# Patient Record
Sex: Female | Born: 1935 | Race: White | Hispanic: No | Smoking: Never smoker
Health system: Southern US, Community
[De-identification: ages and names within clinical notes are randomized; demographics above are authoritative.]

## PROBLEM LIST (undated history)

## (undated) DIAGNOSIS — I10 Essential (primary) hypertension: Secondary | ICD-10-CM

## (undated) DIAGNOSIS — E7800 Pure hypercholesterolemia, unspecified: Secondary | ICD-10-CM

## (undated) DIAGNOSIS — K5900 Constipation, unspecified: Secondary | ICD-10-CM

## (undated) DIAGNOSIS — J810 Acute pulmonary edema: Secondary | ICD-10-CM

## (undated) DIAGNOSIS — J189 Pneumonia, unspecified organism: Secondary | ICD-10-CM

## (undated) DIAGNOSIS — K449 Diaphragmatic hernia without obstruction or gangrene: Secondary | ICD-10-CM

## (undated) DIAGNOSIS — I639 Cerebral infarction, unspecified: Secondary | ICD-10-CM

## (undated) HISTORY — DX: Diaphragmatic hernia without obstruction or gangrene: K44.9

## (undated) HISTORY — PX: BREAST LUMPECTOMY: SHX2

## (undated) HISTORY — PX: TONSILLECTOMY: SUR1361

---

## 1998-04-04 DIAGNOSIS — C4492 Squamous cell carcinoma of skin, unspecified: Secondary | ICD-10-CM

## 1998-04-04 HISTORY — DX: Squamous cell carcinoma of skin, unspecified: C44.92

## 2003-08-24 DIAGNOSIS — C4492 Squamous cell carcinoma of skin, unspecified: Secondary | ICD-10-CM

## 2003-08-24 HISTORY — DX: Squamous cell carcinoma of skin, unspecified: C44.92

## 2006-07-05 ENCOUNTER — Inpatient Hospital Stay: Payer: Medicare Other | Admitting: Cardiology

## 2011-01-30 DIAGNOSIS — I10 Essential (primary) hypertension: Secondary | ICD-10-CM | POA: Diagnosis present

## 2011-01-30 DIAGNOSIS — M47817 Spondylosis without myelopathy or radiculopathy, lumbosacral region: Secondary | ICD-10-CM | POA: Diagnosis present

## 2011-01-30 DIAGNOSIS — Z7982 Long term (current) use of aspirin: Secondary | ICD-10-CM

## 2011-01-30 DIAGNOSIS — E785 Hyperlipidemia, unspecified: Secondary | ICD-10-CM | POA: Diagnosis present

## 2011-01-30 DIAGNOSIS — R9431 Abnormal electrocardiogram [ECG] [EKG]: Secondary | ICD-10-CM | POA: Diagnosis present

## 2011-01-30 DIAGNOSIS — R29898 Other symptoms and signs involving the musculoskeletal system: Secondary | ICD-10-CM | POA: Diagnosis present

## 2011-01-30 DIAGNOSIS — I6350 Cerebral infarction due to unspecified occlusion or stenosis of unspecified cerebral artery: Principal | ICD-10-CM | POA: Diagnosis present

## 2011-01-30 DIAGNOSIS — Z79899 Other long term (current) drug therapy: Secondary | ICD-10-CM

## 2011-01-30 DIAGNOSIS — I739 Peripheral vascular disease, unspecified: Secondary | ICD-10-CM | POA: Diagnosis present

## 2011-01-31 ENCOUNTER — Inpatient Hospital Stay (HOSPITAL_COMMUNITY)
Admission: RE | Admit: 2011-01-31 | Discharge: 2011-02-02 | DRG: 066 | Disposition: A | Discharge status: Home / Self Care | Payer: Medicare Other | Source: Ambulatory Visit | Attending: Family Medicine | Admitting: Family Medicine

## 2011-02-01 LAB — HEMOGLOBIN A1C
Hgb A1c MFr Bld: 5.8 % — ABNORMAL HIGH (ref <5.7)
Mean Plasma Glucose: 120 mg/dL — ABNORMAL HIGH (ref <117)

## 2011-02-02 DIAGNOSIS — G459 Transient cerebral ischemic attack, unspecified: Secondary | ICD-10-CM

## 2011-02-11 NOTE — Discharge Summary (Signed)
[name], Lane                ACCOUNT NO.:  [id number]  MEDICAL RECORD NO.:  [id number]           PATIENT TYPE:  I  LOCATION:  [id number]                         FACILITY:  [facility]  PHYSICIAN:  Estill Cotta, MD       DATE OF BIRTH:  07-21-1925  DATE OF ADMISSION:  01/31/2011 DATE OF DISCHARGE:                        DISCHARGE SUMMARY - REFERRING   PRIMARY CARE PHYSICIAN:  Rory Percy, MD in [place].  DISCHARGE DIAGNOSES: 1. Acute cerebrovascular accident. 2. Hypertension. 3. Hyperlipidemia. 4. History of peripheral vascular disease with edema.  DISCHARGE MEDICATIONS: 1. Plavix 75 mg p.o. daily. 2. Amlodipine 10 mg p.o. daily. 3. Fish oil 1000 mg 1 capsule p.o. b.i.d. 4. Lasix 40 mg in a.m. and 20 mg in the evening. 5. Docusate 100 mg p.o. q.a.m. 6. Toprol XL 50 mg p.o. q.a.m. 7. Vitamin B12 of 1000 mcg 1 tablet p.o. q.a.m.  CONSULTATIONS:  Neurology, Dr. Krista Blue.  HISTORY OF PRESENT ILLNESS:  Ms. Brooke Lane is an 77 year old female with history of hypertension, hyperlipidemia, peripheral vascular disease who was sent from the [facility] for right-sided weakness.  The patient had initially gone to the emergency room at [facility] on Tuesday, Jan 27, 2011, three days ago when she had developed right-sided arm and leg numbness and tingling with slurred speech.  Per the patient, the symptoms had lasted 20 minutes.  She was already on aspirin.  She was discharged home and was recommended full-dose aspirin.  The patient had carotid Dopplers done as outpatient a day before the admission and was a negative study.  On day prior, on the same day at 6:30 p.m., the patient developed right lower extremity numbness and tingling and slurred speech again.  The slurriness of speech resolved, however, the patient continues to drag her right leg.  Hence, she presented again to the [facility] where MRI was done.  MRI was positive for acute CVA.  Hence, the patient was sent to  [facility] for further workup.  RADIOLOGICAL DATA:  MRI at [facility] showed acute/subacute lacunar infarct in the posterior limb of internal capsule  and also acute infarction in the right corpus callosum.  Echocardiogram has been done, however, results are pending at the time of my dictation.  Carotid Dopplers were done which showed no evidence of ICA stenosis bilaterally  BRIEF HOSPITALIZATION COURSE:  Ms. Brooke Lane is an 77 year old female who presented from the [facility] with acute CVA. 1. Acute CVA.  The patient was admitted at [facility] per the     patient's request.  She was already on aspirin, hence aspirin was     discontinued and she was started on Plavix.  Neurology consultation     was obtained and recommended continuing Plavix, risk factor     control, physical therapy, and complete workup.  Echocardiogram has     been done, however, the results are pending at the time of the     dictation.  PT, OT evaluation was done and the patient has been     doing well and ambulating with a walker without any difficulty.    The patient will be  discharged home today.  PHYSICAL EXAMINATION:  VITAL SIGNS:  At the time of dictation, temperature 97.8, pulse 69, respirations 18, blood pressure 122/71, O2 saturation 96% on room air. GENERAL:  The patient is alert, awake and oriented x3, not in any distress. HEENT:  Anicteric sclerae.  Pink conjunctivae.  Pupils equal and round to light and accommodation.  EOMI. NECK:  Supple.  No lymphadenopathy, no JVD. CVS:  S1, S2 clear.  Regular rate and rhythm. CHEST:  Clear to auscultation bilaterally. ABDOMEN:  Soft, nontender, nondistended, with bowel sounds. EXTREMITIES:  There was no cyanosis or clubbing noted.   DISCHARGE FOLLOWUP:  With Dr. Rory Percy in 2 weeks; Dr. Antony Contras, Stroke Service, in 1 month.  Discharge time, 35 minutes.     Estill Cotta, MD     RR/MEDQ  D:  02/02/2011  T:  02/02/2011  Job:  [id number]  cc:    Marcial Pacas, MD  Pramod P. Leonie Man, MD Fax: [phone]  Rory Percy, MD Fax: [phone]  Electronically Signed by Mary Secord  on 02/11/2011 10:23:08 AM

## 2011-02-11 NOTE — H&P (Signed)
Brooke Lane, Brooke Lane                ACCOUNT NO.:  [id number]  MEDICAL RECORD NO.:  [id number]           PATIENT TYPE:  I  LOCATION:  [id number]                         FACILITY:  [place]  PHYSICIAN:  Estill Cotta, MD       DATE OF BIRTH:  August 13, 1925  DATE OF ADMISSION:  01/30/2011 DATE OF DISCHARGE:                             HISTORY & PHYSICAL   PRIMARY CARE PHYSICIAN:  Rory Percy, MD in [place].  CHIEF COMPLAINT:  Sent from [facility] for acute stroke.  HISTORY OF PRESENT ILLNESS:  Brooke Lane is an 77 year old female with history of hypertension, hyperlipidemia, peripheral vascular disease with peripheral edema, who was sent from the [facility] for right- sided weakness.  History was obtained from the patient and her family present in the room.  The patient had initially gone to the emergency room at [facility] on Tuesday, Jan 27, 2011, three days ago when she had developed right-sided arm and leg numbness and tingling with slurred speech.  Per the patient, the symptoms had lasted 20 minutes. The patient was already on aspirin, she was discharged home and was recommended full-dose aspirin.  The patient was advised to follow up with her PCP and carotid Dopplers were done outpatient on Thursday yesterday, which per the report from the [facility] records was negative study except some plaques, but no significant stenosis.  At 6:30 p.m. yesterday, the patient developed right lower extremity numbness and tingling and slurred speech again.  The patient's family noted that on ambulating the patient was dragging her right leg.  The patient herself denied any headache or any blurry vision, any confusion or any dizziness or lightheadedness.  The slurriness of speech lasted about 20 minutes. The patient, however, continued to drag her right leg, hence she presented again to the [facility], there MRI of the brain was done.  The MRI showed acute/subacute lacunar infarct  in the posterior limb of the internal capsule on left and also acute infarction in the right corpus callosum.  The patient was sent to the [facility] for further workup.  At this time, the patient denies any headache or any chest pain, palpitations, any dizziness, lightheadedness.  She does not have any slurring of speech or any confusion.  However, on ambulation she still drags her right foot.  PAST MEDICAL HISTORY: 1. Hypertension. 2. Hyperlipidemia. 3. History of chronic low back pain secondary to arthritis. 4. Peripheral vascular disease with edema.  SURGICAL HISTORY:  Tonsillectomy.  SOCIAL HISTORY:  The patient denies any smoking, alcohol, or any drug use.  She currently lives at home alone and ambulates without any assistance.  MEDICATIONS:  Prior to admission: 1. Aspirin 325 mg p.o. daily for the last 2 days per patient. 2. Docusate 100 mg p.o. p.r.n. for constipation. 3. Vitamin B12 weekly. 4. Norvasc at 10 mg p.o. daily. 5. Metoprolol 50 mg p.o. daily. 6. Lasix 40 mg p.o. daily. 7. Meclizine p.r.n.  PHYSICAL EXAMINATION:  VITAL SIGNS: Temperature 98.3, pulse 83, respirations 18, and blood pressure 146/73. GENERAL:  The patient is alert, awake and oriented x3, not in acute distress.  HEENT:  Anicteric sclerae.  Pink conjunctivae.  Pupils reactive to light and accommodation.  EOMI. NECK:  Supple.  No lymphopathy, no JVD. CVS:  S1, S2 clear. CHEST:  Clear to auscultation bilaterally. ABDOMEN:  Soft, nontender, nondistended.  Normal bowel sounds. EXTREMITIES:  A 1+ edema with chronic venous stasis changes noted on the bilateral lower extremities, more on the left compared to right. NEURO:  Alert and oriented x3.  Cranial nerves II through XII intact. Strength 5/5 in upper and lower extremities, however, on ambulation the patient drags her right foot.  LAB DIAGNOSTIC DATA:  Lab diagnostic data from [facility], sodium 139, potassium 3.6, BUN 19,  creatinine 1.1, INR 1.0.  EKG in the [facility] had shown a rate of 89, sinus rhythm, left ventricular hypertrophy, old lateral infarct, ST elevation, inferior injury.  This echo was done on Jan 27, 2011.  We will repeat another echo.  MRI at [facility] done on Jan 30, 2011, showed late acute/subacute lacunar infarct, posterior limb of internal capsule on the left is likely symptomatic, larger and likely more recent acute infarction affects the right division region of the corpus callosum and parietooccipital radiation, forceps major.  IMPRESSION AND PLAN:  Brooke Lane is an 77 year old female with history of hypertension, hyperlipidemia, peripheral vascular disease, presents with acute cerebrovascular accident from [facility]. 1. Acute cerebrovascular accident.  The patient will be admitted to     the Medicine Service.  We will follow the ischemic CVA protocol.     As the patient was on aspirin and still having symptoms of right     lower extremity weakness with MRI positive for acute CVA, I will     start the patient on Plavix.  I discussed in detail with Dr. Krista Blue     from neurology service who recommended starting the patient on     Plavix 75 mg daily and discontinuing aspirin.  The patient will     have a 2-D echocardiogram done, MRA of the head and neck.  She may     or may not need TEE.  However, we will defer to the Neurology for     further recommendations.  We will also obtain fasting lipid panel,     PT, OT evaluation as well.  Per the patient, she has not tolerated     statins in the past due to significant myalgias, hence for now we     will hold off on the statins. 2. Hypertension.  Restart the patient's p.o. antihypertensives. 3. Abnormal EKG.  We will also obtain EKG at baseline here and cardiac     enzymes to rule out any ACS.  A 2-D     echocardiogram will be also obtained for the EF or any regional     wall motion abnormalities. 4. Hyperlipidemia.   We will obtain fasting lipid panel. 5. DVT prophylaxis.  Lovenox for DVT prophylaxis.     Estill Cotta, MD     RR/MEDQ  D:  01/30/2011  T:  01/30/2011  Job:  [id number]  cc:   Rory Percy, MD Fax: [phone]  Marcial Pacas, MD  Electronically Signed by RIPUDEEP RAI  on 02/11/2011 10:22:36 AM

## 2011-02-11 NOTE — Consult Note (Signed)
Brooke Lane, RUSHLOW                ACCOUNT NO.:  [id number]  MEDICAL RECORD NO.:  [id number]           PATIENT TYPE:  I  LOCATION:  [id number]                         FACILITY:  [facility]  PHYSICIAN:  Marcial Pacas, MD          DATE OF BIRTH:  09-Apr-1925  DATE OF CONSULTATION: DATE OF DISCHARGE:                                CONSULTATION   Neurology Consultation  CHIEF COMPLAINT:  Stroke.  CONSULT FROM:  Hospitalist, Dr. Carloyn Manner.  HISTORY OF PRESENT ILLNESS:  Patient is a pleasant 77 year old right- handed Caucasian female with past medical history of hypertension, hyperlipidemia, peripheral vascular disease, was taking aspirin 81 mg every day.  She was transferred from [facility] on Jan 30, 2011 after MRI has demonstrated acute stroke involving left thalamus adjacent posterior limb of internal capsule and also larger size stroke involving right corpus callosum in the right parietal occipital radiation.  Patient presenting with right arm and leg weakness on Tuesday, Jan 27, 2011 after she got up from the seated position.  There was also mild numbness, tingling on the right side and slurred speech.  She was taken by family to [facility], but symptoms recovered within 30 minutes, she was discharged home.  She was able to drive herself outpatient setting to have ultrasound of carotid artery on the following Thursday, which showed no significant stenosis.  On 6:00 p.m., Friday, Jan 30, 2011, her family noticed she has right lower extremity weakness, dragging her right leg while ambulating, presented to the [facility] again leading to MRI findings detailed above.  Overall, patient has slight improvement, still at baseline she is ambulating without cane, functional at home, complained chronic low back pain radiating pain to her left lower extremity.  REVIEW OF SYSTEMS:  Pertinent as above.  PAST MEDICAL HISTORY: 1. Hypertension. 2. Hyperlipidemia. 3. Chronic low  back pain. 4. Peripheral vascular disease.  PAST SURGICAL HISTORY:  Tonsillectomy.  SOCIAL HISTORY:  She lives alone at home and ambulatory without assistance and independent on daily activity.  Denies smoking, drinking, illicit drugs.  CURRENT MEDICATIONS: 1. Norvasc 10 mg every day. 2. Plavix 75 mg every day. 3. Lovenox 40 mg subcutaneous. 4. Lasix 40 mg every day. 5. Metoprolol 50 mg every day. 6. Tylenol p.r.n. 7. Zofran 4 mg p.r.n.  ALLERGIES:  No known drug allergy.  PHYSICAL EXAMINATION:  VITAL SIGNS:  Temperature 97.9, blood pressure 132/69, heart rate of 62, respiration of 18. CARDIAC:  Regular rate and rhythm. NEUROLOGICAL:  She is awake, alert, oriented to history, taking care of conversation.  Cranial nerves II through XII.  Pupil equal, round, reactive to light.  Extraocular movements were full.  Visual fields were full on confrontational test.  Facial sensation strength was normal. Uvula and tongue midline.  Head turning shoulder shrugging normal symmetric.  Motor examination normal tone, bulk and strength on four extremity with exception of slight right knee flexion weakness. SENSORY:  Normal to light touch.  No extinction on double spontaneous stimulation.  Deep tendon reflex present and symmetric.  Plantar responses were flexor.  COORDINATION:  Normal finger-to-nose, heel-to- shin.  GAIT:  Wide-based cautious dragging right leg mildly.  ASSESSMENT AND PLAN:  [age]-year-old female with vascular risk factor hypertension, hyperlipidemia, peripheral vascular disease, presenting with acute stroke, left thalamus and adjacent posterior limb of IC and right corpus callosum  1. She certainly has multiple vascular risk factor, needs tight     control of blood pressure and hyperlipidemia.  Goal blood pressure     less than 130 out of 80, LDL less than 100. 2. Complete stroke evaluation including echocardiogram. 3. Physical therapy, occupational therapy. 4. Stop  aspirin, change to Plavix. 5. Follow up with [facility], Dr. Krista Blue 1 month upon     discharge.     Marcial Pacas, MD     YY/MEDQ  D:  01/31/2011  T:  01/31/2011  Job:  [id number]  Electronically Signed by Marcial Pacas MD on 02/11/2011 03:55:12 PM

## 2011-10-22 DIAGNOSIS — I10 Essential (primary) hypertension: Secondary | ICD-10-CM | POA: Diagnosis not present

## 2011-10-22 DIAGNOSIS — E538 Deficiency of other specified B group vitamins: Secondary | ICD-10-CM | POA: Diagnosis not present

## 2011-10-22 DIAGNOSIS — E7800 Pure hypercholesterolemia, unspecified: Secondary | ICD-10-CM | POA: Diagnosis not present

## 2011-10-22 DIAGNOSIS — I6789 Other cerebrovascular disease: Secondary | ICD-10-CM | POA: Diagnosis not present

## 2011-10-27 DIAGNOSIS — I10 Essential (primary) hypertension: Secondary | ICD-10-CM | POA: Diagnosis not present

## 2011-10-27 DIAGNOSIS — E7800 Pure hypercholesterolemia, unspecified: Secondary | ICD-10-CM | POA: Diagnosis not present

## 2011-10-27 DIAGNOSIS — I6789 Other cerebrovascular disease: Secondary | ICD-10-CM | POA: Diagnosis not present

## 2011-11-23 DIAGNOSIS — D0460 Carcinoma in situ of skin of unspecified upper limb, including shoulder: Secondary | ICD-10-CM | POA: Diagnosis not present

## 2011-11-23 DIAGNOSIS — L820 Inflamed seborrheic keratosis: Secondary | ICD-10-CM | POA: Diagnosis not present

## 2011-11-23 DIAGNOSIS — L570 Actinic keratosis: Secondary | ICD-10-CM | POA: Diagnosis not present

## 2011-11-23 DIAGNOSIS — L821 Other seborrheic keratosis: Secondary | ICD-10-CM | POA: Diagnosis not present

## 2012-04-26 DIAGNOSIS — E7800 Pure hypercholesterolemia, unspecified: Secondary | ICD-10-CM | POA: Diagnosis not present

## 2012-04-26 DIAGNOSIS — I10 Essential (primary) hypertension: Secondary | ICD-10-CM | POA: Diagnosis not present

## 2012-04-26 DIAGNOSIS — I6789 Other cerebrovascular disease: Secondary | ICD-10-CM | POA: Diagnosis not present

## 2012-04-28 DIAGNOSIS — H353190 Nonexudative age-related macular degeneration, unspecified eye, stage unspecified: Secondary | ICD-10-CM | POA: Diagnosis not present

## 2012-05-18 DIAGNOSIS — H40029 Open angle with borderline findings, high risk, unspecified eye: Secondary | ICD-10-CM | POA: Diagnosis not present

## 2012-05-18 DIAGNOSIS — H2510 Age-related nuclear cataract, unspecified eye: Secondary | ICD-10-CM | POA: Diagnosis not present

## 2012-05-18 DIAGNOSIS — H3530 Unspecified macular degeneration: Secondary | ICD-10-CM | POA: Diagnosis not present

## 2012-05-20 DIAGNOSIS — I10 Essential (primary) hypertension: Secondary | ICD-10-CM | POA: Diagnosis not present

## 2012-05-31 DIAGNOSIS — H2510 Age-related nuclear cataract, unspecified eye: Secondary | ICD-10-CM | POA: Diagnosis not present

## 2012-05-31 DIAGNOSIS — H2589 Other age-related cataract: Secondary | ICD-10-CM | POA: Diagnosis not present

## 2012-07-13 DIAGNOSIS — E7800 Pure hypercholesterolemia, unspecified: Secondary | ICD-10-CM | POA: Diagnosis not present

## 2012-07-13 DIAGNOSIS — I10 Essential (primary) hypertension: Secondary | ICD-10-CM | POA: Diagnosis not present

## 2012-07-13 DIAGNOSIS — I6789 Other cerebrovascular disease: Secondary | ICD-10-CM | POA: Diagnosis not present

## 2012-08-30 DIAGNOSIS — Z23 Encounter for immunization: Secondary | ICD-10-CM | POA: Diagnosis not present

## 2012-11-22 DIAGNOSIS — M999 Biomechanical lesion, unspecified: Secondary | ICD-10-CM | POA: Diagnosis not present

## 2012-11-22 DIAGNOSIS — M5430 Sciatica, unspecified side: Secondary | ICD-10-CM | POA: Diagnosis not present

## 2013-01-06 DIAGNOSIS — I10 Essential (primary) hypertension: Secondary | ICD-10-CM | POA: Diagnosis not present

## 2013-01-06 DIAGNOSIS — E538 Deficiency of other specified B group vitamins: Secondary | ICD-10-CM | POA: Diagnosis not present

## 2013-01-06 DIAGNOSIS — I6789 Other cerebrovascular disease: Secondary | ICD-10-CM | POA: Diagnosis not present

## 2013-01-06 DIAGNOSIS — E7800 Pure hypercholesterolemia, unspecified: Secondary | ICD-10-CM | POA: Diagnosis not present

## 2013-01-09 DIAGNOSIS — I10 Essential (primary) hypertension: Secondary | ICD-10-CM | POA: Diagnosis not present

## 2013-01-09 DIAGNOSIS — I6789 Other cerebrovascular disease: Secondary | ICD-10-CM | POA: Diagnosis not present

## 2013-01-09 DIAGNOSIS — E7800 Pure hypercholesterolemia, unspecified: Secondary | ICD-10-CM | POA: Diagnosis not present

## 2013-02-02 DIAGNOSIS — E538 Deficiency of other specified B group vitamins: Secondary | ICD-10-CM | POA: Diagnosis not present

## 2013-03-23 DIAGNOSIS — E538 Deficiency of other specified B group vitamins: Secondary | ICD-10-CM | POA: Diagnosis not present

## 2013-05-08 DIAGNOSIS — E538 Deficiency of other specified B group vitamins: Secondary | ICD-10-CM | POA: Diagnosis not present

## 2013-05-31 DIAGNOSIS — M5430 Sciatica, unspecified side: Secondary | ICD-10-CM | POA: Diagnosis not present

## 2013-05-31 DIAGNOSIS — M999 Biomechanical lesion, unspecified: Secondary | ICD-10-CM | POA: Diagnosis not present

## 2013-06-13 DIAGNOSIS — M5430 Sciatica, unspecified side: Secondary | ICD-10-CM | POA: Diagnosis not present

## 2013-06-13 DIAGNOSIS — M999 Biomechanical lesion, unspecified: Secondary | ICD-10-CM | POA: Diagnosis not present

## 2013-06-13 DIAGNOSIS — E538 Deficiency of other specified B group vitamins: Secondary | ICD-10-CM | POA: Diagnosis not present

## 2013-07-05 DIAGNOSIS — I6789 Other cerebrovascular disease: Secondary | ICD-10-CM | POA: Diagnosis not present

## 2013-07-05 DIAGNOSIS — E7800 Pure hypercholesterolemia, unspecified: Secondary | ICD-10-CM | POA: Diagnosis not present

## 2013-07-05 DIAGNOSIS — I10 Essential (primary) hypertension: Secondary | ICD-10-CM | POA: Diagnosis not present

## 2013-07-05 DIAGNOSIS — E538 Deficiency of other specified B group vitamins: Secondary | ICD-10-CM | POA: Diagnosis not present

## 2013-07-08 DIAGNOSIS — Z23 Encounter for immunization: Secondary | ICD-10-CM | POA: Diagnosis not present

## 2013-07-12 DIAGNOSIS — E538 Deficiency of other specified B group vitamins: Secondary | ICD-10-CM | POA: Diagnosis not present

## 2013-07-12 DIAGNOSIS — M129 Arthropathy, unspecified: Secondary | ICD-10-CM | POA: Diagnosis not present

## 2013-07-12 DIAGNOSIS — E7800 Pure hypercholesterolemia, unspecified: Secondary | ICD-10-CM | POA: Diagnosis not present

## 2013-07-12 DIAGNOSIS — I6789 Other cerebrovascular disease: Secondary | ICD-10-CM | POA: Diagnosis not present

## 2013-07-12 DIAGNOSIS — I10 Essential (primary) hypertension: Secondary | ICD-10-CM | POA: Diagnosis not present

## 2014-01-04 DIAGNOSIS — M129 Arthropathy, unspecified: Secondary | ICD-10-CM | POA: Diagnosis not present

## 2014-01-04 DIAGNOSIS — E538 Deficiency of other specified B group vitamins: Secondary | ICD-10-CM | POA: Diagnosis not present

## 2014-01-04 DIAGNOSIS — I6789 Other cerebrovascular disease: Secondary | ICD-10-CM | POA: Diagnosis not present

## 2014-01-04 DIAGNOSIS — E7800 Pure hypercholesterolemia, unspecified: Secondary | ICD-10-CM | POA: Diagnosis not present

## 2014-01-04 DIAGNOSIS — E109 Type 1 diabetes mellitus without complications: Secondary | ICD-10-CM | POA: Diagnosis not present

## 2014-01-04 DIAGNOSIS — I10 Essential (primary) hypertension: Secondary | ICD-10-CM | POA: Diagnosis not present

## 2014-01-11 DIAGNOSIS — I10 Essential (primary) hypertension: Secondary | ICD-10-CM | POA: Diagnosis not present

## 2014-01-11 DIAGNOSIS — I6789 Other cerebrovascular disease: Secondary | ICD-10-CM | POA: Diagnosis not present

## 2014-01-11 DIAGNOSIS — Z23 Encounter for immunization: Secondary | ICD-10-CM | POA: Diagnosis not present

## 2014-01-11 DIAGNOSIS — M129 Arthropathy, unspecified: Secondary | ICD-10-CM | POA: Diagnosis not present

## 2014-01-11 DIAGNOSIS — E7800 Pure hypercholesterolemia, unspecified: Secondary | ICD-10-CM | POA: Diagnosis not present

## 2014-07-05 DIAGNOSIS — M1990 Unspecified osteoarthritis, unspecified site: Secondary | ICD-10-CM | POA: Diagnosis not present

## 2014-07-05 DIAGNOSIS — I10 Essential (primary) hypertension: Secondary | ICD-10-CM | POA: Diagnosis not present

## 2014-07-05 DIAGNOSIS — E780 Pure hypercholesterolemia: Secondary | ICD-10-CM | POA: Diagnosis not present

## 2014-07-05 DIAGNOSIS — I6789 Other cerebrovascular disease: Secondary | ICD-10-CM | POA: Diagnosis not present

## 2014-07-05 DIAGNOSIS — E1165 Type 2 diabetes mellitus with hyperglycemia: Secondary | ICD-10-CM | POA: Diagnosis not present

## 2014-07-12 DIAGNOSIS — Z23 Encounter for immunization: Secondary | ICD-10-CM | POA: Diagnosis not present

## 2014-07-12 DIAGNOSIS — I10 Essential (primary) hypertension: Secondary | ICD-10-CM | POA: Diagnosis not present

## 2014-07-12 DIAGNOSIS — E780 Pure hypercholesterolemia: Secondary | ICD-10-CM | POA: Diagnosis not present

## 2014-07-12 DIAGNOSIS — I6789 Other cerebrovascular disease: Secondary | ICD-10-CM | POA: Diagnosis not present

## 2014-07-12 DIAGNOSIS — M1990 Unspecified osteoarthritis, unspecified site: Secondary | ICD-10-CM | POA: Diagnosis not present

## 2014-09-10 DIAGNOSIS — H26492 Other secondary cataract, left eye: Secondary | ICD-10-CM | POA: Diagnosis not present

## 2014-12-25 DIAGNOSIS — M1990 Unspecified osteoarthritis, unspecified site: Secondary | ICD-10-CM | POA: Diagnosis not present

## 2014-12-25 DIAGNOSIS — E780 Pure hypercholesterolemia: Secondary | ICD-10-CM | POA: Diagnosis not present

## 2014-12-25 DIAGNOSIS — D519 Vitamin B12 deficiency anemia, unspecified: Secondary | ICD-10-CM | POA: Diagnosis not present

## 2014-12-25 DIAGNOSIS — I10 Essential (primary) hypertension: Secondary | ICD-10-CM | POA: Diagnosis not present

## 2014-12-25 DIAGNOSIS — I6789 Other cerebrovascular disease: Secondary | ICD-10-CM | POA: Diagnosis not present

## 2015-01-14 DIAGNOSIS — E780 Pure hypercholesterolemia: Secondary | ICD-10-CM | POA: Diagnosis not present

## 2015-01-14 DIAGNOSIS — I6789 Other cerebrovascular disease: Secondary | ICD-10-CM | POA: Diagnosis not present

## 2015-01-14 DIAGNOSIS — M1990 Unspecified osteoarthritis, unspecified site: Secondary | ICD-10-CM | POA: Diagnosis not present

## 2015-01-14 DIAGNOSIS — I10 Essential (primary) hypertension: Secondary | ICD-10-CM | POA: Diagnosis not present

## 2015-01-30 DIAGNOSIS — H903 Sensorineural hearing loss, bilateral: Secondary | ICD-10-CM | POA: Diagnosis not present

## 2015-01-30 DIAGNOSIS — J310 Chronic rhinitis: Secondary | ICD-10-CM | POA: Diagnosis not present

## 2015-02-06 ENCOUNTER — Encounter: Payer: Self-pay | Admitting: *Deleted

## 2015-06-12 DIAGNOSIS — L409 Psoriasis, unspecified: Secondary | ICD-10-CM | POA: Diagnosis not present

## 2015-06-12 DIAGNOSIS — D239 Other benign neoplasm of skin, unspecified: Secondary | ICD-10-CM | POA: Diagnosis not present

## 2015-06-12 DIAGNOSIS — D049 Carcinoma in situ of skin, unspecified: Secondary | ICD-10-CM | POA: Diagnosis not present

## 2015-06-18 DIAGNOSIS — M25561 Pain in right knee: Secondary | ICD-10-CM | POA: Diagnosis not present

## 2015-06-18 DIAGNOSIS — M1711 Unilateral primary osteoarthritis, right knee: Secondary | ICD-10-CM | POA: Diagnosis not present

## 2015-07-09 DIAGNOSIS — M1990 Unspecified osteoarthritis, unspecified site: Secondary | ICD-10-CM | POA: Diagnosis not present

## 2015-07-09 DIAGNOSIS — E039 Hypothyroidism, unspecified: Secondary | ICD-10-CM | POA: Diagnosis not present

## 2015-07-09 DIAGNOSIS — I10 Essential (primary) hypertension: Secondary | ICD-10-CM | POA: Diagnosis not present

## 2015-07-09 DIAGNOSIS — D519 Vitamin B12 deficiency anemia, unspecified: Secondary | ICD-10-CM | POA: Diagnosis not present

## 2015-07-09 DIAGNOSIS — R5383 Other fatigue: Secondary | ICD-10-CM | POA: Diagnosis not present

## 2015-07-09 DIAGNOSIS — R739 Hyperglycemia, unspecified: Secondary | ICD-10-CM | POA: Diagnosis not present

## 2015-07-09 DIAGNOSIS — I6789 Other cerebrovascular disease: Secondary | ICD-10-CM | POA: Diagnosis not present

## 2015-07-09 DIAGNOSIS — E559 Vitamin D deficiency, unspecified: Secondary | ICD-10-CM | POA: Diagnosis not present

## 2015-07-09 DIAGNOSIS — E7800 Pure hypercholesterolemia, unspecified: Secondary | ICD-10-CM | POA: Diagnosis not present

## 2015-07-13 DIAGNOSIS — Z23 Encounter for immunization: Secondary | ICD-10-CM | POA: Diagnosis not present

## 2015-07-16 DIAGNOSIS — D519 Vitamin B12 deficiency anemia, unspecified: Secondary | ICD-10-CM | POA: Diagnosis not present

## 2015-07-16 DIAGNOSIS — I6789 Other cerebrovascular disease: Secondary | ICD-10-CM | POA: Diagnosis not present

## 2015-07-16 DIAGNOSIS — E039 Hypothyroidism, unspecified: Secondary | ICD-10-CM | POA: Diagnosis not present

## 2015-07-16 DIAGNOSIS — I10 Essential (primary) hypertension: Secondary | ICD-10-CM | POA: Diagnosis not present

## 2015-09-12 DIAGNOSIS — H2511 Age-related nuclear cataract, right eye: Secondary | ICD-10-CM | POA: Diagnosis not present

## 2015-09-12 DIAGNOSIS — H40031 Anatomical narrow angle, right eye: Secondary | ICD-10-CM | POA: Diagnosis not present

## 2015-09-12 DIAGNOSIS — H353120 Nonexudative age-related macular degeneration, left eye, stage unspecified: Secondary | ICD-10-CM | POA: Diagnosis not present

## 2015-09-12 DIAGNOSIS — H53001 Unspecified amblyopia, right eye: Secondary | ICD-10-CM | POA: Diagnosis not present

## 2015-10-28 DIAGNOSIS — M1711 Unilateral primary osteoarthritis, right knee: Secondary | ICD-10-CM | POA: Diagnosis not present

## 2016-01-08 DIAGNOSIS — R7309 Other abnormal glucose: Secondary | ICD-10-CM | POA: Diagnosis not present

## 2016-01-08 DIAGNOSIS — I10 Essential (primary) hypertension: Secondary | ICD-10-CM | POA: Diagnosis not present

## 2016-01-08 DIAGNOSIS — R5383 Other fatigue: Secondary | ICD-10-CM | POA: Diagnosis not present

## 2016-01-08 DIAGNOSIS — E039 Hypothyroidism, unspecified: Secondary | ICD-10-CM | POA: Diagnosis not present

## 2016-01-08 DIAGNOSIS — E7800 Pure hypercholesterolemia, unspecified: Secondary | ICD-10-CM | POA: Diagnosis not present

## 2016-01-08 DIAGNOSIS — M1990 Unspecified osteoarthritis, unspecified site: Secondary | ICD-10-CM | POA: Diagnosis not present

## 2016-01-08 DIAGNOSIS — D519 Vitamin B12 deficiency anemia, unspecified: Secondary | ICD-10-CM | POA: Diagnosis not present

## 2016-01-08 DIAGNOSIS — I6789 Other cerebrovascular disease: Secondary | ICD-10-CM | POA: Diagnosis not present

## 2016-01-28 DIAGNOSIS — M1990 Unspecified osteoarthritis, unspecified site: Secondary | ICD-10-CM | POA: Diagnosis not present

## 2016-01-28 DIAGNOSIS — I6789 Other cerebrovascular disease: Secondary | ICD-10-CM | POA: Diagnosis not present

## 2016-01-28 DIAGNOSIS — I10 Essential (primary) hypertension: Secondary | ICD-10-CM | POA: Diagnosis not present

## 2016-01-28 DIAGNOSIS — D519 Vitamin B12 deficiency anemia, unspecified: Secondary | ICD-10-CM | POA: Diagnosis not present

## 2016-01-28 DIAGNOSIS — R5383 Other fatigue: Secondary | ICD-10-CM | POA: Diagnosis not present

## 2016-01-28 DIAGNOSIS — E039 Hypothyroidism, unspecified: Secondary | ICD-10-CM | POA: Diagnosis not present

## 2016-01-28 DIAGNOSIS — E7800 Pure hypercholesterolemia, unspecified: Secondary | ICD-10-CM | POA: Diagnosis not present

## 2016-03-12 DIAGNOSIS — H353120 Nonexudative age-related macular degeneration, left eye, stage unspecified: Secondary | ICD-10-CM | POA: Diagnosis not present

## 2016-03-12 DIAGNOSIS — H26492 Other secondary cataract, left eye: Secondary | ICD-10-CM | POA: Diagnosis not present

## 2016-07-11 DIAGNOSIS — Z23 Encounter for immunization: Secondary | ICD-10-CM | POA: Diagnosis not present

## 2016-07-27 DIAGNOSIS — R5383 Other fatigue: Secondary | ICD-10-CM | POA: Diagnosis not present

## 2016-07-27 DIAGNOSIS — D519 Vitamin B12 deficiency anemia, unspecified: Secondary | ICD-10-CM | POA: Diagnosis not present

## 2016-07-27 DIAGNOSIS — I10 Essential (primary) hypertension: Secondary | ICD-10-CM | POA: Diagnosis not present

## 2016-07-27 DIAGNOSIS — M1990 Unspecified osteoarthritis, unspecified site: Secondary | ICD-10-CM | POA: Diagnosis not present

## 2016-07-27 DIAGNOSIS — E7800 Pure hypercholesterolemia, unspecified: Secondary | ICD-10-CM | POA: Diagnosis not present

## 2016-07-27 DIAGNOSIS — I6789 Other cerebrovascular disease: Secondary | ICD-10-CM | POA: Diagnosis not present

## 2016-07-27 DIAGNOSIS — E039 Hypothyroidism, unspecified: Secondary | ICD-10-CM | POA: Diagnosis not present

## 2016-07-28 DIAGNOSIS — I6789 Other cerebrovascular disease: Secondary | ICD-10-CM | POA: Diagnosis not present

## 2016-07-28 DIAGNOSIS — M1990 Unspecified osteoarthritis, unspecified site: Secondary | ICD-10-CM | POA: Diagnosis not present

## 2016-07-28 DIAGNOSIS — Z6841 Body Mass Index (BMI) 40.0 and over, adult: Secondary | ICD-10-CM | POA: Diagnosis not present

## 2016-07-28 DIAGNOSIS — I10 Essential (primary) hypertension: Secondary | ICD-10-CM | POA: Diagnosis not present

## 2016-07-28 DIAGNOSIS — E039 Hypothyroidism, unspecified: Secondary | ICD-10-CM | POA: Diagnosis not present

## 2016-09-10 DIAGNOSIS — I10 Essential (primary) hypertension: Secondary | ICD-10-CM | POA: Diagnosis not present

## 2016-10-29 DIAGNOSIS — I10 Essential (primary) hypertension: Secondary | ICD-10-CM | POA: Diagnosis not present

## 2016-10-29 DIAGNOSIS — R5383 Other fatigue: Secondary | ICD-10-CM | POA: Diagnosis not present

## 2016-10-29 DIAGNOSIS — I6789 Other cerebrovascular disease: Secondary | ICD-10-CM | POA: Diagnosis not present

## 2016-10-29 DIAGNOSIS — E039 Hypothyroidism, unspecified: Secondary | ICD-10-CM | POA: Diagnosis not present

## 2016-12-01 ENCOUNTER — Inpatient Hospital Stay (HOSPITAL_COMMUNITY)
Admission: EM | Admit: 2016-12-01 | Discharge: 2016-12-09 | DRG: 291 | Disposition: A | Discharge status: Home / Self Care | Payer: Medicare Other | Attending: Internal Medicine | Admitting: Internal Medicine

## 2016-12-01 ENCOUNTER — Inpatient Hospital Stay (HOSPITAL_COMMUNITY): Payer: Medicare Other

## 2016-12-01 ENCOUNTER — Encounter (HOSPITAL_COMMUNITY): Payer: Self-pay

## 2016-12-01 ENCOUNTER — Emergency Department (HOSPITAL_COMMUNITY): Payer: Medicare Other

## 2016-12-01 DIAGNOSIS — Z79899 Other long term (current) drug therapy: Secondary | ICD-10-CM | POA: Diagnosis not present

## 2016-12-01 DIAGNOSIS — J440 Chronic obstructive pulmonary disease with acute lower respiratory infection: Secondary | ICD-10-CM | POA: Diagnosis present

## 2016-12-01 DIAGNOSIS — R0602 Shortness of breath: Secondary | ICD-10-CM | POA: Diagnosis not present

## 2016-12-01 DIAGNOSIS — G4730 Sleep apnea, unspecified: Secondary | ICD-10-CM | POA: Diagnosis present

## 2016-12-01 DIAGNOSIS — Z8701 Personal history of pneumonia (recurrent): Secondary | ICD-10-CM | POA: Diagnosis not present

## 2016-12-01 DIAGNOSIS — Z8673 Personal history of transient ischemic attack (TIA), and cerebral infarction without residual deficits: Secondary | ICD-10-CM

## 2016-12-01 DIAGNOSIS — Z7902 Long term (current) use of antithrombotics/antiplatelets: Secondary | ICD-10-CM | POA: Diagnosis not present

## 2016-12-01 DIAGNOSIS — G9341 Metabolic encephalopathy: Secondary | ICD-10-CM | POA: Diagnosis present

## 2016-12-01 DIAGNOSIS — K449 Diaphragmatic hernia without obstruction or gangrene: Secondary | ICD-10-CM | POA: Diagnosis present

## 2016-12-01 DIAGNOSIS — R05 Cough: Secondary | ICD-10-CM | POA: Diagnosis not present

## 2016-12-01 DIAGNOSIS — I110 Hypertensive heart disease with heart failure: Secondary | ICD-10-CM | POA: Diagnosis not present

## 2016-12-01 DIAGNOSIS — H1031 Unspecified acute conjunctivitis, right eye: Secondary | ICD-10-CM | POA: Diagnosis not present

## 2016-12-01 DIAGNOSIS — E662 Morbid (severe) obesity with alveolar hypoventilation: Secondary | ICD-10-CM | POA: Diagnosis present

## 2016-12-01 DIAGNOSIS — E7800 Pure hypercholesterolemia, unspecified: Secondary | ICD-10-CM | POA: Diagnosis present

## 2016-12-01 DIAGNOSIS — I5031 Acute diastolic (congestive) heart failure: Secondary | ICD-10-CM | POA: Diagnosis present

## 2016-12-01 DIAGNOSIS — J9602 Acute respiratory failure with hypercapnia: Secondary | ICD-10-CM | POA: Diagnosis present

## 2016-12-01 DIAGNOSIS — I5033 Acute on chronic diastolic (congestive) heart failure: Secondary | ICD-10-CM | POA: Diagnosis present

## 2016-12-01 DIAGNOSIS — J810 Acute pulmonary edema: Secondary | ICD-10-CM | POA: Diagnosis not present

## 2016-12-01 DIAGNOSIS — Z66 Do not resuscitate: Secondary | ICD-10-CM | POA: Diagnosis present

## 2016-12-01 DIAGNOSIS — Z6839 Body mass index (BMI) 39.0-39.9, adult: Secondary | ICD-10-CM

## 2016-12-01 DIAGNOSIS — K5909 Other constipation: Secondary | ICD-10-CM | POA: Diagnosis present

## 2016-12-01 DIAGNOSIS — E872 Acidosis: Secondary | ICD-10-CM | POA: Diagnosis not present

## 2016-12-01 DIAGNOSIS — R4182 Altered mental status, unspecified: Secondary | ICD-10-CM | POA: Diagnosis present

## 2016-12-01 DIAGNOSIS — I10 Essential (primary) hypertension: Secondary | ICD-10-CM | POA: Diagnosis not present

## 2016-12-01 DIAGNOSIS — J9622 Acute and chronic respiratory failure with hypercapnia: Secondary | ICD-10-CM | POA: Diagnosis not present

## 2016-12-01 DIAGNOSIS — J9621 Acute and chronic respiratory failure with hypoxia: Secondary | ICD-10-CM | POA: Diagnosis not present

## 2016-12-01 DIAGNOSIS — I5030 Unspecified diastolic (congestive) heart failure: Secondary | ICD-10-CM | POA: Diagnosis not present

## 2016-12-01 DIAGNOSIS — J189 Pneumonia, unspecified organism: Secondary | ICD-10-CM | POA: Diagnosis not present

## 2016-12-01 DIAGNOSIS — J9601 Acute respiratory failure with hypoxia: Secondary | ICD-10-CM | POA: Diagnosis not present

## 2016-12-01 HISTORY — DX: Essential (primary) hypertension: I10

## 2016-12-01 HISTORY — DX: Pure hypercholesterolemia, unspecified: E78.00

## 2016-12-01 HISTORY — DX: Cerebral infarction, unspecified: I63.9

## 2016-12-01 LAB — BASIC METABOLIC PANEL
ANION GAP: 10 (ref 5–15)
Anion gap: 7 (ref 5–15)
BUN: 30 mg/dL — ABNORMAL HIGH (ref 6–20)
BUN: 28 mg/dL — ABNORMAL HIGH (ref 6–20)
CALCIUM: 9.1 mg/dL (ref 8.9–10.3)
CHLORIDE: 96 mmol/L — AB (ref 101–111)
CO2: 26 mmol/L (ref 22–32)
CO2: 30 mmol/L (ref 22–32)
CREATININE: 1.4 mg/dL — AB (ref 0.44–1.00)
Calcium: 8.6 mg/dL — ABNORMAL LOW (ref 8.9–10.3)
Chloride: 99 mmol/L — ABNORMAL LOW (ref 101–111)
Creatinine, Ser: 1.32 mg/dL — ABNORMAL HIGH (ref 0.44–1.00)
GFR calc non Af Amer: 32 mL/min — ABNORMAL LOW (ref >60)
GFR, EST AFRICAN AMERICAN: 40 mL/min — AB (ref >60)
GFR, EST AFRICAN AMERICAN: 37 mL/min — AB (ref >60)
GFR, EST NON AFRICAN AMERICAN: 35 mL/min — AB (ref >60)
Glucose, Bld: 166 mg/dL — ABNORMAL HIGH (ref 65–99)
Glucose, Bld: 143 mg/dL — ABNORMAL HIGH (ref 65–99)
POTASSIUM: 4 mmol/L (ref 3.5–5.1)
POTASSIUM: 4.4 mmol/L (ref 3.5–5.1)
SODIUM: 135 mmol/L (ref 135–145)
SODIUM: 133 mmol/L — AB (ref 135–145)

## 2016-12-01 LAB — BLOOD GAS, ARTERIAL
ACID-BASE EXCESS: 1 mmol/L (ref 0.0–2.0)
ACID-BASE EXCESS: 2.8 mmol/L — AB (ref 0.0–2.0)
BICARBONATE: 24.9 mmol/L (ref 20.0–28.0)
Bicarbonate: 23.1 mmol/L (ref 20.0–28.0)
DRAWN BY: 234301
DRAWN BY: 21310
Delivery systems: POSITIVE
Expiratory PAP: 8
FIO2: 40
INSPIRATORY PAP: 20
O2 Content: 2 L/min
O2 SAT: 94 %
O2 Saturation: 88.3 %
PATIENT TEMPERATURE: 37
PCO2 ART: 76.2 mmHg — AB (ref 32.0–48.0)
PH ART: 7.196 — AB (ref 7.350–7.450)
PO2 ART: 70.6 mmHg — AB (ref 83.0–108.0)
pCO2 arterial: 71.1 mmHg (ref 32.0–48.0)
pH, Arterial: 7.243 — ABNORMAL LOW (ref 7.350–7.450)
pO2, Arterial: 55 mmHg — ABNORMAL LOW (ref 83.0–108.0)

## 2016-12-01 LAB — CBC WITH DIFFERENTIAL/PLATELET
Basophils Absolute: 0 K/uL (ref 0.0–0.1)
Basophils Relative: 0 %
Eosinophils Absolute: 0.3 K/uL (ref 0.0–0.7)
Eosinophils Relative: 2 %
HCT: 39 % (ref 36.0–46.0)
Hemoglobin: 12.7 g/dL (ref 12.0–15.0)
Lymphocytes Relative: 6 %
Lymphs Abs: 0.7 K/uL (ref 0.7–4.0)
MCH: 29.5 pg (ref 26.0–34.0)
MCHC: 32.6 g/dL (ref 30.0–36.0)
MCV: 90.7 fL (ref 78.0–100.0)
Monocytes Absolute: 1.3 K/uL — ABNORMAL HIGH (ref 0.1–1.0)
Monocytes Relative: 12 %
Neutro Abs: 8.9 K/uL — ABNORMAL HIGH (ref 1.7–7.7)
Neutrophils Relative %: 80 %
Platelets: 262 K/uL (ref 150–400)
RBC: 4.3 MIL/uL (ref 3.87–5.11)
RDW: 14.4 % (ref 11.5–15.5)
WBC: 11.2 K/uL — ABNORMAL HIGH (ref 4.0–10.5)

## 2016-12-01 LAB — I-STAT CG4 LACTIC ACID, ED: LACTIC ACID, VENOUS: 1.03 mmol/L (ref 0.5–1.9)

## 2016-12-01 LAB — I-STAT TROPONIN, ED: Troponin i, poc: 0 ng/mL (ref 0.00–0.08)

## 2016-12-01 LAB — STREP PNEUMONIAE URINARY ANTIGEN: STREP PNEUMO URINARY ANTIGEN: NEGATIVE

## 2016-12-01 LAB — BRAIN NATRIURETIC PEPTIDE: B NATRIURETIC PEPTIDE 5: 61 pg/mL (ref 0.0–100.0)

## 2016-12-01 LAB — INFLUENZA PANEL BY PCR (TYPE A & B)
Influenza A By PCR: NEGATIVE
Influenza B By PCR: NEGATIVE

## 2016-12-01 MED ORDER — FUROSEMIDE 10 MG/ML IJ SOLN
40.0000 mg | Freq: Once | INTRAMUSCULAR | Status: AC
  Administered 2016-12-01: 40 mg via INTRAVENOUS
  Filled 2016-12-01: qty 4

## 2016-12-01 MED ORDER — DEXTROSE 5 % IV SOLN
500.0000 mg | INTRAVENOUS | Status: DC
  Administered 2016-12-01 – 2016-12-07 (×7): 500 mg via INTRAVENOUS
  Filled 2016-12-01 (×8): qty 500

## 2016-12-01 MED ORDER — SODIUM CHLORIDE 0.9 % IV BOLUS (SEPSIS)
500.0000 mL | Freq: Once | INTRAVENOUS | Status: AC
  Administered 2016-12-01: 500 mL via INTRAVENOUS

## 2016-12-01 MED ORDER — ACETAMINOPHEN 325 MG PO TABS
650.0000 mg | ORAL_TABLET | Freq: Four times a day (QID) | ORAL | Status: DC | PRN
  Administered 2016-12-01 – 2016-12-03 (×4): 650 mg via ORAL
  Filled 2016-12-01 (×4): qty 2

## 2016-12-01 MED ORDER — SODIUM CHLORIDE 0.9 % IV SOLN
INTRAVENOUS | Status: AC
  Administered 2016-12-01: 05:00:00 via INTRAVENOUS

## 2016-12-01 MED ORDER — LOSARTAN POTASSIUM-HCTZ 100-25 MG PO TABS: 1.0000 | ORAL_TABLET | Freq: Every day | ORAL | Status: DC

## 2016-12-01 MED ORDER — IPRATROPIUM-ALBUTEROL 0.5-2.5 (3) MG/3ML IN SOLN
3.0000 mL | Freq: Four times a day (QID) | RESPIRATORY_TRACT | Status: DC
  Administered 2016-12-01 – 2016-12-03 (×7): 3 mL via RESPIRATORY_TRACT
  Filled 2016-12-01 (×7): qty 3

## 2016-12-01 MED ORDER — DEXTROSE 5 % IV SOLN
500.0000 mg | INTRAVENOUS | Status: DC
  Filled 2016-12-01: qty 500

## 2016-12-01 MED ORDER — LOSARTAN POTASSIUM 50 MG PO TABS: 100.0000 mg | ORAL_TABLET | Freq: Every day | ORAL | Status: DC

## 2016-12-01 MED ORDER — GEMFIBROZIL 600 MG PO TABS
600.0000 mg | ORAL_TABLET | Freq: Two times a day (BID) | ORAL | Status: DC
  Administered 2016-12-01 – 2016-12-09 (×15): 600 mg via ORAL
  Filled 2016-12-01 (×17): qty 1

## 2016-12-01 MED ORDER — ALBUTEROL SULFATE (2.5 MG/3ML) 0.083% IN NEBU
2.5000 mg | INHALATION_SOLUTION | Freq: Once | RESPIRATORY_TRACT | Status: AC
  Administered 2016-12-01: 2.5 mg via RESPIRATORY_TRACT
  Filled 2016-12-01: qty 3

## 2016-12-01 MED ORDER — ONDANSETRON HCL 4 MG/2ML IJ SOLN
4.0000 mg | Freq: Once | INTRAMUSCULAR | Status: AC
  Administered 2016-12-01: 4 mg via INTRAVENOUS
  Filled 2016-12-01: qty 2

## 2016-12-01 MED ORDER — DEXTROSE 5 % IV SOLN
1.0000 g | INTRAVENOUS | Status: DC
  Filled 2016-12-01: qty 10

## 2016-12-01 MED ORDER — CLOPIDOGREL BISULFATE 75 MG PO TABS
75.0000 mg | ORAL_TABLET | Freq: Every day | ORAL | Status: DC
  Administered 2016-12-01 – 2016-12-09 (×9): 75 mg via ORAL
  Filled 2016-12-01 (×9): qty 1

## 2016-12-01 MED ORDER — HYDROCHLOROTHIAZIDE 25 MG PO TABS: 25.0000 mg | ORAL_TABLET | Freq: Every day | ORAL | Status: DC

## 2016-12-01 MED ORDER — AMLODIPINE BESYLATE 5 MG PO TABS
5.0000 mg | ORAL_TABLET | Freq: Every day | ORAL | Status: DC
  Administered 2016-12-01 – 2016-12-09 (×9): 5 mg via ORAL
  Filled 2016-12-01 (×9): qty 1

## 2016-12-01 MED ORDER — DEXTROMETHORPHAN POLISTIREX ER 30 MG/5ML PO SUER
15.0000 mg | Freq: Two times a day (BID) | ORAL | Status: AC
  Administered 2016-12-01 – 2016-12-03 (×4): 15 mg via ORAL
  Filled 2016-12-01 (×5): qty 5

## 2016-12-01 MED ORDER — CEFTRIAXONE SODIUM 1 G IJ SOLR
1.0000 g | INTRAMUSCULAR | Status: DC
  Administered 2016-12-01 – 2016-12-08 (×8): 1 g via INTRAVENOUS
  Filled 2016-12-01 (×8): qty 10

## 2016-12-01 MED ORDER — POLYMYXIN B-TRIMETHOPRIM 10000-0.1 UNIT/ML-% OP SOLN
2.0000 [drp] | OPHTHALMIC | Status: DC
  Administered 2016-12-01 – 2016-12-09 (×43): 2 [drp] via OPHTHALMIC
  Filled 2016-12-01 (×2): qty 10

## 2016-12-01 NOTE — ED Provider Notes (Signed)
[facility] DEPT Provider Note   CSN: [id number] Arrival date & time: 12/01/16  0100     History   Chief Complaint Chief Complaint  Patient presents with  . Shortness of Breath    HPI Brooke Lane is a 83 y.o. female.  Patient presents to the emergency department for evaluation of shortness of breath. Reports that symptoms have been present for approximately 4 days. She started with nasal congestion and then developed cough. Cough has progressively worsened and she has been very short of breath tonight. Patient does report a previous history of pneumonia with similar symptoms. She does report that she had a fever the other day, none today. She is not expressing chest pain. She has had nausea but no vomiting. No diarrhea or abdominal pain.      Past Medical History:  Diagnosis Date  . Hiatal hernia   . High cholesterol   . Hypertension   . Stroke [facility])     There are no active problems to display for this patient.   Past Surgical History:  Procedure Laterality Date  . BREAST LUMPECTOMY    . TONSILLECTOMY      OB History    No data available       Home Medications    Prior to Admission medications   Not on File    Family History No family history on file.  Social History Social History  Substance Use Topics  . Smoking status: Never Smoker  . Smokeless tobacco: Never Used  . Alcohol use No     Allergies   Patient has no known allergies.   Review of Systems Review of Systems  Constitutional: Positive for chills and fever.  Respiratory: Positive for cough and shortness of breath.   Gastrointestinal: Positive for nausea.  All other systems reviewed and are negative.    Physical Exam Updated Vital Signs BP (!) 167/56 (BP Location: Left Arm)   Pulse 100   Temp 98.1 F (36.7 C) (Oral)   Resp 20   Ht 5\' 7"  (1.702 m)   Wt 250 lb (113.4 kg)   SpO2 (!) 83% Comment: patient taking breathing tx at this time  BMI 39.16 kg/m   Physical  Exam  Constitutional: She is oriented to person, place, and time. She appears well-developed and well-nourished. No distress.  HENT:  Head: Normocephalic and atraumatic.  Right Ear: Hearing normal.  Left Ear: Hearing normal.  Nose: Nose normal.  Mouth/Throat: Oropharynx is clear and moist and mucous membranes are normal.  Eyes: Conjunctivae and EOM are normal. Pupils are equal, round, and reactive to light.  Neck: Normal range of motion. Neck supple.  Cardiovascular: Regular rhythm, S1 normal and S2 normal.  Tachycardia present.  Exam reveals no gallop and no friction rub.   No murmur heard. Pulmonary/Chest: Effort normal. No respiratory distress. She has rhonchi. She exhibits no tenderness.  Abdominal: Soft. Normal appearance and bowel sounds are normal. There is no hepatosplenomegaly. There is no tenderness. There is no rebound, no guarding, no tenderness at McBurney's point and negative Murphy's sign. No hernia.  Musculoskeletal: Normal range of motion.  Neurological: She is alert and oriented to person, place, and time. She has normal strength. No cranial nerve deficit or sensory deficit. Coordination normal. GCS eye subscore is 4. GCS verbal subscore is 5. GCS motor subscore is 6.  Skin: Skin is warm, dry and intact. No rash noted. No cyanosis.  Psychiatric: She has a normal mood and affect. Her speech is normal  and behavior is normal. Thought content normal.  Nursing note and vitals reviewed.    ED Treatments / Results  Labs (all labs ordered are listed, but only abnormal results are displayed) Labs Reviewed  CBC WITH DIFFERENTIAL/PLATELET - Abnormal; Notable for the following:       Result Value   WBC 11.2 (*)    Neutro Abs 8.9 (*)    Monocytes Absolute 1.3 (*)    All other components within normal limits  CULTURE, BLOOD (ROUTINE X 2)  CULTURE, BLOOD (ROUTINE X 2)  BASIC METABOLIC PANEL  BRAIN NATRIURETIC PEPTIDE  INFLUENZA PANEL BY PCR (TYPE A & B)  I-STAT CG4 LACTIC  ACID, ED    EKG  EKG Interpretation None       Radiology Dg Chest 2 View  Result Date: 12/01/2016 CLINICAL DATA:  83 year old female with productive cough and shortness of breath. EXAM: CHEST  2 VIEW COMPARISON:  None. FINDINGS: There is emphysematous changes of the lungs. Patchy areas of airspace density in the right upper lobe along the minor fissure as well as in the right middle lobe are concerning for pneumonia. Small left upper lobe hazy airspace density is also noted. There is no significant pleural effusion. No pneumothorax. Top-normal cardiac silhouette. No acute osseous pathology. IMPRESSION: Multiple area of airspace density predominantly involving the right lung likely multifocal pneumonia. Clinical correlation and follow-up to resolution recommended. Electronically Signed   By: Anner Crete M.D.   On: 12/01/2016 02:07    Procedures Procedures (including critical care time)  Medications Ordered in ED Medications  cefTRIAXone (ROCEPHIN) 1 g in dextrose 5 % 50 mL IVPB (not administered)  azithromycin (ZITHROMAX) 500 mg in dextrose 5 % 250 mL IVPB (not administered)  sodium chloride 0.9 % bolus 500 mL (not administered)  ondansetron (ZOFRAN) injection 4 mg (4 mg Intravenous Given 12/01/16 0157)  albuterol (PROVENTIL) (2.5 MG/3ML) 0.083% nebulizer solution 2.5 mg (2.5 mg Nebulization Given 12/01/16 0202)     Initial Impression / Assessment and Plan / ED Course  I have reviewed the triage vital signs and the nursing notes.  Pertinent labs & imaging results that were available during my care of the patient were reviewed by me and considered in my medical decision making (see chart for details).     Patient presents to the emergency department for evaluation of cough, fever sensation and shortness of breath. Symptoms ongoing for 4+ days. Chest x-ray shows evidence of multifocal pneumonia. Patient had borderline oxygen saturations at arrival. With exertion here in the ER,  however, her oxygen saturation dropped to 83%. She was initiated on treatment for community acquired pneumonia with Rocephin and Zithromax, supplemental oxygen and will be admitted to the hospital.  Final Clinical Impressions(s) / ED Diagnoses   Final diagnoses:  Community acquired pneumonia, unspecified laterality    New Prescriptions New Prescriptions   No medications on file     Orpah Greek, MD 12/01/16 [phone]

## 2016-12-01 NOTE — Progress Notes (Signed)
Patient transferred to ICU from medical floor. Patient is not very responsive at this time, only responding to painful stimuli. Foley inserted using sterile technique, patient tolerated well. Patient is on Bipap at this time and is tolerating with O2 saturations at 96%. Family at bedside.

## 2016-12-01 NOTE — Progress Notes (Signed)
Pt's 02 saturations on RA were 82-83%. Breathing tx given and placed on 4LNC. Patient's 02 saturations increased to 94.

## 2016-12-01 NOTE — Progress Notes (Addendum)
CRITICAL VALUE ALERT  Critical value received:  pH 7.19, pCO2 76.2, pO2 55, Saturation 88  Date of notification:  12/01/16  Time of notification:  4715  Critical value read back:Yes.    Nurse who received alert:  Sharyn Blitz, RN  MD notified (1st page):  Dr. Wynetta Emery  Time of first page:  1707  MD notified (2nd page):  Time of second page:  Responding MD:  Dr. Wynetta Emery  Time MD responded:  1710

## 2016-12-01 NOTE — Progress Notes (Addendum)
12/01/2016 4:48 PM  Progress Note  I came to assess patient as RN reported patient had acute altered mental status.  Family says that she was saying things like "she is going with Jesus" and other things that seemed out of the ordinary.  When I examined patient she said that she was feeling better and specifically was breathing better.  I ordered some things to check including a stat ABG, CXR, EKG, BMP, CBC.  Will follow up on those.  Also ordered some scheduled neb treatments and neuro checks.  Will follow.    Murvin Natal, MD

## 2016-12-01 NOTE — Progress Notes (Addendum)
12/01/2016 4:54 PM Update  Critical Care Progress Note  Acute mental status changes and acute respiratory distress.  abg reviewed:  pCO2 76, pO2 55, pH 7.196 CXR: pulmonary edema seen.  Reviewed EKG, no acute findings.   Exam findings: diffuse bibasilar crackles. Scattered rhonchi.  Lasix 40 mg IV.  Bipap and transfer to SDU.   Updated family at bedside (daughters).  Foley catheter placed.   Traveled with patient to SDU and stayed until patient stabilized.   Scheduled duonebs ordered.    Will place on bipap and transfer to SDU.  Follow in SDU.   Critical Care Time Spent: 34 mins  Murvin Natal, MD

## 2016-12-01 NOTE — ED Triage Notes (Addendum)
Reports of productive cough since Wednesday with nasal congestion. Also reports of feeling shortness of breath and nausea.

## 2016-12-01 NOTE — Progress Notes (Addendum)
12/01/2016  1:10 AM  12/01/2016 8:21 AM  Brooke Lane was seen and examined.  The H&P by the admitting provider, orders, imaging was reviewed.  Exam reveals conjunctivitis of right eye with thick yellow secretion draining from eye.  Trial of polytrim eye drops ordered.  Reconciled home meds.  Holding losartan / HCT given renal insufficiency.  amlodipine ordered for blood pressure, will monitor.  I spoke with 2 daughters at bedside and updated with plan of care. Please see orders.  Will continue to follow.   Murvin Natal, MD [facility]

## 2016-12-01 NOTE — H&P (Signed)
History and Physical    Brooke Lane VOZ:[id number] DOB: 03-13-1925 DOA: 12/01/2016  PCP: Karsten Ro, DO  Patient coming from: home  Chief Complaint:  Cough, feel awful  HPI: Brooke Lane is a 83 y.o. female with medical history significant of HTN, TIA comes in with 4 days of progressive worsening cough and sob.  She says she has been feeling awful in the last day or so but started feeling under the weather 4 days ago.  Cough is productive.  No fevers or chills.  No swelling in her legs.  No sick contacts.  No nasal congestion.  She called her PCP sat who did start her on a zpack.  Last night she kept coughing and could not sleep so came to the ED.  Mid 80s on RA with activity.  Referred for admission for bilateral pna.  No abx recently except zpack started on sat.  Review of Systems: As per HPI otherwise 10 point review of systems negative.   Past Medical History:  Diagnosis Date  . Hiatal hernia   . High cholesterol   . Hypertension   . Stroke [facility])     Past Surgical History:  Procedure Laterality Date  . BREAST LUMPECTOMY    . TONSILLECTOMY       reports that she has never smoked. She has never used smokeless tobacco. She reports that she does not drink alcohol or use drugs.  No Known Allergies  No family history on file. no premature CAD  Prior to Admission medications   Not on File  zpack  Physical Exam: Vitals:   12/01/16 0107 12/01/16 0108 12/01/16 0202 12/01/16 0322  BP:  (!) 167/56  128/79  Pulse:  100  98  Resp:  20  (!) 22  Temp:  98.1 F (36.7 C)    TempSrc:  Oral    SpO2:  92% (!) 83% 94%  Weight: 113.4 kg (250 lb)     Height: 5\' 7"  (1.702 m)       Constitutional: NAD, calm, comfortable Vitals:   12/01/16 0107 12/01/16 0108 12/01/16 0202 12/01/16 0322  BP:  (!) 167/56  128/79  Pulse:  100  98  Resp:  20  (!) 22  Temp:  98.1 F (36.7 C)    TempSrc:  Oral    SpO2:  92% (!) 83% 94%  Weight: 113.4 kg (250 lb)     Height: 5\' 7"   (1.702 m)      Eyes: PERRL, lids and conjunctivae normal ENMT: Mucous membranes are moist. Posterior pharynx clear of any exudate or lesions.Normal dentition.  Neck: normal, supple, no masses, no thyromegaly Respiratory: clear to auscultation bilaterally, no wheezing, no crackles. Normal respiratory effort. No accessory muscle use.  Cardiovascular: Regular rate and rhythm, no murmurs / rubs / gallops. No extremity edema. 2+ pedal pulses. No carotid bruits.  Abdomen: no tenderness, no masses palpated. No hepatosplenomegaly. Bowel sounds positive.  Musculoskeletal: no clubbing / cyanosis. No joint deformity upper and lower extremities. Good ROM, no contractures. Normal muscle tone.  Skin: no rashes, lesions, ulcers. No induration Neurologic: CN 2-12 grossly intact. Sensation intact, DTR normal. Strength 5/5 in all 4.  Psychiatric: Normal judgment and insight. Alert and oriented x 3. Normal mood.    Labs on Admission: I have personally reviewed following labs and imaging studies  CBC:  Recent Labs Lab 12/01/16 0138  WBC 11.2*  NEUTROABS 8.9*  HGB 12.7  HCT 39.0  MCV 90.7  PLT 262  Basic Metabolic Panel:  Recent Labs Lab 12/01/16 0138  NA 135  K 4.0  CL 99*  CO2 26  GLUCOSE 166*  BUN 30*  CREATININE 1.32*  CALCIUM 9.1   GFR: Estimated Creatinine Clearance: 37.5 mL/min (A) (by C-G formula based on SCr of 1.32 mg/dL (H)).  Radiological Exams on Admission: Dg Chest 2 View  Result Date: 12/01/2016 CLINICAL DATA:  83 year old female with productive cough and shortness of breath. EXAM: CHEST  2 VIEW COMPARISON:  None. FINDINGS: There is emphysematous changes of the lungs. Patchy areas of airspace density in the right upper lobe along the minor fissure as well as in the right middle lobe are concerning for pneumonia. Small left upper lobe hazy airspace density is also noted. There is no significant pleural effusion. No pneumothorax. Top-normal cardiac silhouette. No acute  osseous pathology. IMPRESSION: Multiple area of airspace density predominantly involving the right lung likely multifocal pneumonia. Clinical correlation and follow-up to resolution recommended. Electronically Signed   By: Anner Crete M.D.   On: 12/01/2016 02:07    Assessment/Plan 83 yo female with bilateral pna  Principal Problem:   PNA (pneumonia)- bilateral and community acquired.  Place on pna pathway.  Iv rocephin and azithromycin.  Supplemental oxygen.  Active Problems:   Hypertension- stable   High cholesterol- stable   Acute respiratory failure with hypoxia (HCC)- mild, due to above    DVT prophylaxis:  scds Code Status:  full Family Communication:  none  Disposition Plan:  Per day team Consults called:  none Admission status:   admission   DAVID,RACHAL A MD [facility]  If 7PM-7AM, please contact night-coverage [email or web address] Password [facility]  12/01/2016, 3:34 AM

## 2016-12-01 NOTE — Evaluation (Addendum)
Physical Therapy Evaluation Patient Details Name: Brooke Lane MRN: [id number] DOB: 05-13-1925 Today's Date: 12/01/2016   History of Present Illness  83 y.o. female with medical history significant of HTN, TIA comes in with 4 days of progressive worsening cough and sob.  She says she has been feeling awful in the last day or so but started feeling under the weather 4 days ago.  Cough is productive.  No fevers or chills.  No swelling in her legs.  No sick contacts.  No nasal congestion.  She called her PCP sat who did start her on a zpack.  Last night she kept coughing and could not sleep so came to the ED.  Mid 80s on RA with activity.  Referred for admission for bilateral pna.  No abx recently except zpack started on sat.  Dx: B CAP  Clinical Impression  Pt received in bed, family present, and pt was agreeable to PT evaluation.  Pt's family expressed concern that pt's mental status has changed over the past few hours.  Janett Billow, RN entered the room, and this concern was expressed to her.  Pt is currently on 4L of supplemental O2, and it was turned down to 2L (due to concern for CO2 retention) with pt maintaiing SpO2 at 96% at rest.  Dtr provided history information as pt is currently confused and only oriented x1.  Pt uses a rollator for ambulation, and is modified independent with gait, dressing, and bathing.  She still drives, and accesses the community.  She lives alone, but has a dtr that lives 5 min away and is at the pt's house nearly every day.  During PT evaluation, she requires Min A for transfer supine<>sit.  While sitting on the edge of the bed, her SpO2 was hovering around 90% while on 2L.  She was able to perform sit<>stand with Min A +2 for sit<>stand, and she took a few lateral steps to the head of the bed.  At this point, she is recommended for SNF, however, will continue to assess during each tx.      Follow Up Recommendations SNF    Equipment Recommendations  None recommended by  PT    Recommendations for Other Services       Precautions / Restrictions Precautions Precautions: Fall Precaution Comments: due to recent immobility Restrictions Weight Bearing Restrictions: No      Mobility  Bed Mobility Overal bed mobility: Needs Assistance Bed Mobility: Supine to Sit     Supine to sit: Min assist;HOB elevated     General bed mobility comments: Pt was able to sit on the EOB for 5 min with supervision.    Transfers Overall transfer level: Needs assistance Equipment used: None Transfers: Sit to/from Stand Sit to Stand: Min assist;+2 physical assistance         General transfer comment: Pt was able to stand with Min A +2 and take 2 lateral steps towards the [facility].  Ambulation/Gait                Stairs            Wheelchair Mobility    Modified Rankin (Stroke Patients Only)       Balance Overall balance assessment: Needs assistance Sitting-balance support: Bilateral upper extremity supported;Feet supported Sitting balance-Leahy Scale: Fair Sitting balance - Comments: Pt requires supervision due to lethargy at this time.     Standing balance support: Bilateral upper extremity supported Standing balance-Leahy Scale: Fair  Pertinent Vitals/Pain Pain Assessment: No/denies pain    Home Living   Living Arrangements: Alone Available Help at Discharge: Family (Dtr deborah lives 5 min away and is there nearly every day.  Other dtr is a retired Therapist, sports and is not far away either. ) Type of Home: House Home Access: Stairs to enter   [employer] of Steps: 2 [place]: One level Home Equipment: [place] - 4 wheels;Shower seat (Pt sleeps in a recliner due to back issues. )      Prior Function Level of Independence: Independent with assistive device(s)   Gait / Transfers Assistance Needed: Pt uses rollator for ambulation at all times.  She is modified independent with community  distances  ADL's / Homemaking Assistance Needed: independent with dressing and bathing.  She still drives and goes with her dtr to the grocery store.         Hand Dominance        Extremity/Trunk Assessment   Upper Extremity Assessment Upper Extremity Assessment: Generalized weakness    Lower Extremity Assessment Lower Extremity Assessment: Generalized weakness       Communication   Communication:  (Pt is confused at this time.  Family reports this has been a change over the past few hours. )  Cognition Arousal/Alertness: Lethargic Behavior During Therapy: Flat affect Overall Cognitive Status: Impaired/Different from baseline Area of Impairment: Orientation Orientation Level: Disoriented to;Time;Place;Situation             General Comments: Pt states that she is "somewhere between heaven and earth."     General Comments      Exercises     Assessment/Plan    PT Assessment Patient needs continued PT services  PT Problem List Decreased strength;Decreased activity tolerance;Decreased balance;Decreased mobility;Decreased cognition;Decreased knowledge of use of DME;Decreased safety awareness;Cardiopulmonary status limiting activity;Obesity       PT Treatment Interventions DME instruction;Gait training;Functional mobility training;Therapeutic activities;Therapeutic exercise;Balance training;Patient/family education;Cognitive remediation    PT Goals (Current goals can be found in the Care Plan section)  Acute Rehab PT Goals Patient Stated Goal: To get stronger PT Goal Formulation: With patient/family Time For Goal Achievement: 12/15/16 Potential to Achieve Goals: Good    Frequency Min 3X/week   Barriers to discharge Decreased caregiver support Pt lives alone    Co-evaluation               End of Session Equipment Utilized During Treatment: Oxygen Activity Tolerance: Patient limited by lethargy Patient left: in bed;with call bell/phone within  reach;with bed alarm set;with family/visitor present Nurse Communication: Mobility status Janett Billow, RN notified of pt's mobiltiy status, and mobility sheet left in the room. ) PT Visit Diagnosis: Other abnormalities of gait and mobility (R26.89);Muscle weakness (generalized) (M62.81)    Functional Assessment Tool Used: AM-PAC 6 Clicks Basic Mobility;Clinical judgement Functional Limitation: Mobility: Walking and moving around Mobility: Walking and Moving Around Current Status (G2542): At least 40 percent but less than 60 percent impaired, limited or restricted Mobility: Walking and Moving Around Goal Status [phone]): At least 20 percent but less than 40 percent impaired, limited or restricted    Time: 7628-3151 PT Time Calculation (min) (ACUTE ONLY): 33 min   Charges:   PT Evaluation $PT Eval Low Complexity: 1 Procedure PT Treatments $Therapeutic Activity: 8-22 mins   PT G Codes:   PT G-Codes **NOT FOR INPATIENT CLASS** Functional Assessment Tool Used: AM-PAC 6 Clicks Basic Mobility;Clinical judgement Functional Limitation: Mobility: Walking and moving around Mobility: Walking and Moving Around Current Status (V6160): At  least 40 percent but less than 60 percent impaired, limited or restricted Mobility: Walking and Moving Around Goal Status [phone]): At least 20 percent but less than 40 percent impaired, limited or restricted     Beth Deisha Stull, PT, DPT X: [phone]

## 2016-12-01 NOTE — Progress Notes (Signed)
Pt transferred to [facility] unit per Dr. Wynetta Emery. Report called to Denyse Amass, Therapist, sports. Verbalized understanding. Pt's IV site WDL and patent. Pt lethargic at this time. VSS. Daughters at bedside, updated, verbalized understanding. Pt transported off floor via hospital bed accompanied by RN, NT and RT.

## 2016-12-01 NOTE — Progress Notes (Signed)
ABG drawn and reported via text page to mid-level, Baltazar Najjar.  PH 7.243, CO2 71.1, pO2 70.6, bicarb 24.9, Sat 94

## 2016-12-02 ENCOUNTER — Inpatient Hospital Stay (HOSPITAL_COMMUNITY): Payer: Medicare Other

## 2016-12-02 DIAGNOSIS — I5030 Unspecified diastolic (congestive) heart failure: Secondary | ICD-10-CM

## 2016-12-02 DIAGNOSIS — J9602 Acute respiratory failure with hypercapnia: Secondary | ICD-10-CM

## 2016-12-02 LAB — ECHOCARDIOGRAM COMPLETE
E decel time: 356 msec
E/e' ratio: 9.48
FS: 37 % (ref 28–44)
Height: 67 in
IVS/LV PW RATIO, ED: 0.88
LA vol A4C: 39.8 ml
LADIAMINDEX: 1.44 cm/m2
LASIZE: 34 mm
LAVOL: 45.6 mL
LAVOLIN: 19.3 mL/m2
LEFT ATRIUM END SYS DIAM: 34 mm
LV e' LATERAL: 10.2 cm/s
LVEEAVG: 9.48
LVEEMED: 9.48
LVOT area: 3.14 cm2
LVOT diameter: 20 mm
MV Dec: 356
MV Peak grad: 4 mmHg
MVPKAVEL: 139 m/s
MVPKEVEL: 96.7 m/s
PW: 14.7 mm — AB (ref 0.6–1.1)
TAPSE: 19.8 mm
TDI e' lateral: 10.2
TDI e' medial: 8.7
Weight: 4000 oz

## 2016-12-02 LAB — CBC
HEMATOCRIT: 38.2 % (ref 36.0–46.0)
Hemoglobin: 11.6 g/dL — ABNORMAL LOW (ref 12.0–15.0)
MCH: 28.8 pg (ref 26.0–34.0)
MCHC: 30.4 g/dL (ref 30.0–36.0)
MCV: 94.8 fL (ref 78.0–100.0)
Platelets: 287 10*3/uL (ref 150–400)
RBC: 4.03 MIL/uL (ref 3.87–5.11)
RDW: 15 % (ref 11.5–15.5)
WBC: 8.1 10*3/uL (ref 4.0–10.5)

## 2016-12-02 LAB — BLOOD GAS, ARTERIAL
Acid-Base Excess: 2.3 mmol/L — ABNORMAL HIGH (ref 0.0–2.0)
Acid-Base Excess: 4 mmol/L — ABNORMAL HIGH (ref 0.0–2.0)
BICARBONATE: 26.6 mmol/L (ref 20.0–28.0)
Bicarbonate: 24.4 mmol/L (ref 20.0–28.0)
DELIVERY SYSTEMS: POSITIVE
DRAWN BY: 21310
DRAWN BY: 277331
FIO2: 40
O2 CONTENT: 4 L/min
O2 SAT: 95.1 %
O2 Saturation: 96.7 %
PCO2 ART: 68.6 mmHg — AB (ref 32.0–48.0)
PH ART: 7.248 — AB (ref 7.350–7.450)
Patient temperature: 37
Patient temperature: 37
pCO2 arterial: 64.7 mmHg — ABNORMAL HIGH (ref 32.0–48.0)
pH, Arterial: 7.29 — ABNORMAL LOW (ref 7.350–7.450)
pO2, Arterial: 83.7 mmHg (ref 83.0–108.0)
pO2, Arterial: 71.4 mmHg — ABNORMAL LOW (ref 83.0–108.0)

## 2016-12-02 LAB — BASIC METABOLIC PANEL
ANION GAP: 7 (ref 5–15)
BUN: 31 mg/dL — ABNORMAL HIGH (ref 6–20)
CALCIUM: 8.4 mg/dL — AB (ref 8.9–10.3)
CO2: 29 mmol/L (ref 22–32)
Chloride: 98 mmol/L — ABNORMAL LOW (ref 101–111)
Creatinine, Ser: 1.43 mg/dL — ABNORMAL HIGH (ref 0.44–1.00)
GFR, EST AFRICAN AMERICAN: 36 mL/min — AB (ref >60)
GFR, EST NON AFRICAN AMERICAN: 31 mL/min — AB (ref >60)
Glucose, Bld: 170 mg/dL — ABNORMAL HIGH (ref 65–99)
POTASSIUM: 4.2 mmol/L (ref 3.5–5.1)
Sodium: 134 mmol/L — ABNORMAL LOW (ref 135–145)

## 2016-12-02 LAB — MRSA PCR SCREENING: MRSA by PCR: NEGATIVE

## 2016-12-02 MED ORDER — HEPARIN SODIUM (PORCINE) 5000 UNIT/ML IJ SOLN
5000.0000 [IU] | Freq: Three times a day (TID) | INTRAMUSCULAR | Status: DC
  Administered 2016-12-02 – 2016-12-09 (×21): 5000 [IU] via SUBCUTANEOUS
  Filled 2016-12-02 (×21): qty 1

## 2016-12-02 MED ORDER — FUROSEMIDE 10 MG/ML IJ SOLN
40.0000 mg | Freq: Two times a day (BID) | INTRAMUSCULAR | Status: DC
  Administered 2016-12-02 – 2016-12-05 (×7): 40 mg via INTRAVENOUS
  Filled 2016-12-02 (×7): qty 4

## 2016-12-02 MED ORDER — GUAIFENESIN ER 600 MG PO TB12
1200.0000 mg | ORAL_TABLET | Freq: Two times a day (BID) | ORAL | Status: DC
  Administered 2016-12-02 – 2016-12-09 (×15): 1200 mg via ORAL
  Filled 2016-12-02 (×15): qty 2

## 2016-12-02 MED ORDER — ALBUTEROL SULFATE (2.5 MG/3ML) 0.083% IN NEBU
2.5000 mg | INHALATION_SOLUTION | RESPIRATORY_TRACT | Status: DC | PRN
  Administered 2016-12-05: 2.5 mg via RESPIRATORY_TRACT
  Filled 2016-12-02: qty 3

## 2016-12-02 NOTE — Progress Notes (Signed)
**Note De-Identified Brooke Lane Obfuscation** Patient removed from BIPAP and placed on 6 L [place], tolerating well.  RRT to continue to [facility]

## 2016-12-02 NOTE — Progress Notes (Signed)
*  PRELIMINARY RESULTS* Echocardiogram 2D Echocardiogram has been performed.  Leavy Cella 12/02/2016, 4:03 PM

## 2016-12-02 NOTE — Care Management Note (Signed)
Case Management Note  Patient Details  Name: Brooke Lane MRN: [id number] Date of Birth: 1925-01-17  Subjective/Objective:   Adm with CAP, developed respiratory distress while here, placed on Bipap and tx to ICU. Patient is from home alone, but has good family support. Her daughters plan to stay with her at time of discharge and decline SNF. They decline need for [facility] services as well since one daughter is an Therapist, sports and will be helping with patients care at time of discharge. She has a rollator PTA. No oxygen, currently being weaned.                Action/Plan: Anticipate DC home in care of family when medically ready. CM will follow for any needs.   Expected Discharge Date:       12/04/2016           Expected Discharge Plan:  Home/Self Care  In-House Referral:  Clinical Social Work  Discharge planning Services  CM Consult  Post Acute Care Choice:  NA Choice offered to:  NA  DME Arranged:    DME Agency:     HH Arranged:    HH Agency:     Status of Service:  In process, will continue to follow  If discussed at Long Length of Stay Meetings, dates discussed:    Additional Comments:  [name], [name], RN 12/02/2016, 12:59 PM

## 2016-12-02 NOTE — Progress Notes (Signed)
ABG drawn and reported via text page to MD, Shanon Brow.  PH 7.248, CO2 68.6, pO2 83.7, bicarb 24.4, Sat 96.7

## 2016-12-02 NOTE — Progress Notes (Signed)
Physical Therapy Treatment Patient Details Name: Brooke Lane MRN: [id number] DOB: July 10, 1925 Today's Date: 12/02/2016    History of Present Illness 83 y.o. female with medical history significant of HTN, TIA comes in with 4 days of progressive worsening cough and sob.  She says she has been feeling awful in the last day or so but started feeling under the weather 4 days ago.  Cough is productive.  No fevers or chills.  No swelling in her legs.  No sick contacts.  No nasal congestion.  She called her PCP sat who did start her on a zpack.  Last night she kept coughing and could not sleep so came to the ED.  Mid 80s on RA with activity.  Referred for admission for bilateral pna.  No abx recently except zpack started on sat.  Dx: B CAP.  On 12/01/2016 she became more confused and found to be in respiratory acidosis. She was sent to the stepdown unit and started on BiPAP therapy. Chest x-ray showed pneumonia versus CHF. She is slowly improving. Currently on intravenous antibiotics and IV Lasix..    PT Comments    Pt received in bed, family present, and pt is agreeable to PT tx.  Pt is much more alert today, and able to follow the conversation better.  She requires cues for safety during mobilization.  She was able to ambulate 253ft with Rollator, but needs cues to slow down.  SpO2 at end of ambulation was 86%, but quickly improves >90% on 4L.  Updated d/c recommendations to HHPT with 24/7 supervision/assistance.    Follow Up Recommendations  Home health PT;Supervision/Assistance - 24 hour     Equipment Recommendations  None recommended by PT    Recommendations for Other Services       Precautions / Restrictions Precautions Precautions: Fall Precaution Comments: due to recent immobility, pt is also moderately impulsive when ambulating, and goes at a fast pace for her age. Marland Kitchen  Restrictions Weight Bearing Restrictions: No    Mobility  Bed Mobility Overal bed mobility: Needs  Assistance Bed Mobility: Supine to Sit     Supine to sit: Min guard;HOB elevated        Transfers Overall transfer level: Needs assistance Equipment used: 4-wheeled walker Transfers: Sit to/from Stand Sit to Stand: Min assist;From elevated surface;+2 safety/equipment (The locks on pt's Rollator do not work., wheels blocked by PT and dtr's feet.  )            Ambulation/Gait Ambulation/Gait assistance: Min assist Ambulation Distance (Feet): 200 Feet Assistive device: 4-wheeled walker Gait Pattern/deviations: Trunk flexed;Step-through pattern     General Gait Details: pt goes at a quick pace, and nearly runs over dtr who is pushing the O2 tank.  Pt encouraged to take her time and slow down.  1 short standing rest break to allow obstacle clear from the hallway - pt tripoded on rollator.  At end of gait, pt expressed feeling like her knees were giving out.     Stairs            Wheelchair Mobility    Modified Rankin (Stroke Patients Only)       Balance Overall balance assessment: Needs assistance Sitting-balance support: Bilateral upper extremity supported;Feet supported Sitting balance-Leahy Scale: Good     Standing balance support: Bilateral upper extremity supported Standing balance-Leahy Scale: Fair                      Cognition Arousal/Alertness: Awake/alert Behavior  During Therapy: Impulsive Overall Cognitive Status: Within Functional Limits for tasks assessed                 General Comments: Orientation has improved, and she is able to follow conversation.     Exercises      General Comments        Pertinent Vitals/Pain Pain Assessment: No/denies pain    Home Living                      Prior Function            PT Goals (current goals can now be found in the care plan section) Acute Rehab PT Goals Patient Stated Goal: To get stronger PT Goal Formulation: With patient/family Time For Goal Achievement:  12/15/16 Potential to Achieve Goals: Good Progress towards PT goals: Progressing toward goals    Frequency    Min 3X/week      PT Plan Discharge plan needs to be updated    Co-evaluation             End of Session Equipment Utilized During Treatment: Oxygen;Gait belt Activity Tolerance: Patient limited by fatigue Patient left: in chair;with call bell/phone within reach;with family/visitor present Nurse Communication: Mobility status PT Visit Diagnosis: Other abnormalities of gait and mobility (R26.89);Muscle weakness (generalized) (M62.81)     Time: 3612-2449 PT Time Calculation (min) (ACUTE ONLY): 26 min  Charges:  $Gait Training: 23-37 mins                    G Codes:       Beth Hope Brandenburger, PT, DPT X: [phone]

## 2016-12-02 NOTE — Plan of Care (Signed)
Problem: Fluid Volume: Goal: Ability to maintain a balanced intake and output will improve Outcome: Progressing Patient has catheter to assess accurate fluid output.

## 2016-12-02 NOTE — Clinical Social Work Note (Signed)
LCSW met with patient. Daughter, Jerline Linzy, was at bedside. LCSW discussed patient's recommendation for SNF.  Mrs. [place]house stated that patient will not be going to SNF, she will be going home.    LCSW signing off.     Jamise Pentland, Clydene Pugh, LCSW

## 2016-12-02 NOTE — Progress Notes (Signed)
PROGRESS NOTE    Brooke Lane  MGQ:[id number] DOB: Sep 20, 1924 DOA: 12/01/2016 PCP: Karsten Ro, DO    Brief Narrative:  83 year old female who was brought to the hospital with a four-day history of progressive cough and shortness of breath. Found to be hypoxic on room air. Referred for further treatment of pneumonia. Became acutely lethargic and confused and was noted to be in respiratory acidosis. She was sent to the stepdown unit and started on BiPAP therapy. Chest x-ray showed pneumonia versus CHF. She is slowly improving. Currently on intravenous antibiotics and IV Lasix.   Assessment & Plan:   Principal Problem:   Community acquired pneumonia Active Problems:   Hypertension   High cholesterol   Acute respiratory failure with hypoxia (HCC)   Acute on chronic alteration in mental status   Flash pulmonary edema (HCC)   Chronic constipation   Acute respiratory failure with hypoxia and hypercarbia (HCC)   Acute respiratory failure with hypoxia and hypercarbia. Possibly precipitated by pneumonia versus CHF. She had a markedly elevated PCO2 and low pH yesterday. She was started on BiPAP therapy with improvement of her PCO2. We'll recheck an ABG later today. Continue to wean down oxygen as tolerated. She is a nonsmoker.  Metabolic encephalopathy related to respiratory acidosis. Initially improved after BiPAP therapy. Continue to monitor.  Community-acquired pneumonia. Continues to have productive cough and required supplemental oxygen. Continue intravenous antibiotics and pulmonary hygiene.  Possible congestive heart failure. Chest x-ray indicated possible CHF and she does have some peripheral edema. Was previously on Lasix, but this was changed to hydrochlorothiazide approximately one year ago. We'll continue with IV Lasix for now. Echocardiogram process.  Deconditioning. Seen by physical therapy and recommended replacement.Patient has refused.   DVT prophylaxis:  Heparin Code Status: Full code Family Communication: Discussed with daughters at the bedside Disposition Plan: Discharge home when improved, patient does not wish to go to a nursing home.   Consultants:     Procedures:   Echo pending  Antimicrobials:   Azithromycin 3/20>>  Ceftriaxone 3/20>>   Subjective: Patient was transferred to the stepdown unit yesterday for worsening in mental status, confusion and lethargy. Found to have hypercarbic respiratory failure and started on BiPAP. Overnight her PCO2 did show some mild improvement. This morning she is more awake and able to carry on a conversation. She was taken off BiPAP this morning and had breakfast. She is still somewhat somnolent, but is less confused. She continues to have a productive cough.  Objective: Vitals:   12/02/16 0900 12/02/16 1000 12/02/16 1100 12/02/16 1105  BP: 126/66 (!) 143/55 (!) 144/57   Pulse: 91 90 87 87  Resp: (!) 26 (!) 30 (!) 31 (!) 31  Temp:    98.6 F (37 C)  TempSrc:    Oral  SpO2: 91% 91% 92% 93%  Weight:      Height:        Intake/Output Summary (Last 24 hours) at 12/02/16 1112 Last data filed at 12/02/16 1100  Gross per 24 hour  Intake             1040 ml  Output             1700 ml  Net             -660 ml   Filed Weights   12/01/16 0107  Weight: 113.4 kg (250 lb)    Examination:  General exam: Appears calm and comfortable , Mildly somnolent Respiratory system: Coarse crackles at  bases. Respiratory effort normal. Cardiovascular system: S1 & S2 heard, RRR. No JVD, murmurs, rubs, gallops or clicks. 1+ pedal edema. Gastrointestinal system: Abdomen is nondistended, soft and nontender. No organomegaly or masses felt. Normal bowel sounds heard. Central nervous system: Alert and oriented. No focal neurological deficits. Extremities: Symmetric 5 x 5 power. Skin: No rashes, lesions or ulcers Psychiatry: Judgement and insight appear normal. Mood & affect appropriate.     Data  Reviewed: I have personally reviewed following labs and imaging studies  CBC:  Recent Labs Lab 12/01/16 0138 12/02/16 0436  WBC 11.2* 8.1  NEUTROABS 8.9*  --   HGB 12.7 11.6*  HCT 39.0 38.2  MCV 90.7 94.8  PLT 262 734   Basic Metabolic Panel:  Recent Labs Lab 12/01/16 0138 12/01/16 1701 12/02/16 0436  NA 135 133* 134*  K 4.0 4.4 4.2  CL 99* 96* 98*  CO2 26 30 29   GLUCOSE 166* 143* 170*  BUN 30* 28* 31*  CREATININE 1.32* 1.40* 1.43*  CALCIUM 9.1 8.6* 8.4*   GFR: Estimated Creatinine Clearance: 34.7 mL/min (A) (by C-G formula based on SCr of 1.43 mg/dL (H)). Liver Function Tests: No results for input(s): AST, ALT, ALKPHOS, BILITOT, PROT, ALBUMIN in the last 168 hours. No results for input(s): LIPASE, AMYLASE in the last 168 hours. No results for input(s): AMMONIA in the last 168 hours. Coagulation Profile: No results for input(s): INR, PROTIME in the last 168 hours. Cardiac Enzymes: No results for input(s): CKTOTAL, CKMB, CKMBINDEX, TROPONINI in the last 168 hours. BNP (last 3 results) No results for input(s): PROBNP in the last 8760 hours. HbA1C: No results for input(s): HGBA1C in the last 72 hours. CBG: No results for input(s): GLUCAP in the last 168 hours. Lipid Profile: No results for input(s): CHOL, HDL, LDLCALC, TRIG, CHOLHDL, LDLDIRECT in the last 72 hours. Thyroid Function Tests: No results for input(s): TSH, T4TOTAL, FREET4, T3FREE, THYROIDAB in the last 72 hours. Anemia Panel: No results for input(s): VITAMINB12, FOLATE, FERRITIN, TIBC, IRON, RETICCTPCT in the last 72 hours. Sepsis Labs:  Recent Labs Lab 12/01/16 2876  LATICACIDVEN 1.03    Recent Results (from the past 240 hour(s))  Culture, blood (Routine X 2) w Reflex to ID Panel     Status: None (Preliminary result)   Collection Time: 12/01/16  3:01 AM  Result Value Ref Range Status   Specimen Description BLOOD LEFT HAND  Final   Special Requests BOTTLES DRAWN AEROBIC ONLY 2CC ONLY  Final    Culture NO GROWTH 1 DAY  Final   Report Status PENDING  Incomplete  Culture, blood (Routine X 2) w Reflex to ID Panel     Status: None (Preliminary result)   Collection Time: 12/01/16  3:08 AM  Result Value Ref Range Status   Specimen Description BLOOD RIGHT HAND  Final   Special Requests BOTTLES DRAWN AEROBIC ONLY 2CC ONLY  Final   Culture NO GROWTH 1 DAY  Final   Report Status PENDING  Incomplete  MRSA PCR Screening     Status: None   Collection Time: 12/02/16  6:30 AM  Result Value Ref Range Status   MRSA by PCR NEGATIVE NEGATIVE Final    Comment:        The GeneXpert MRSA Assay (FDA approved for NASAL specimens only), is one component of a comprehensive MRSA colonization surveillance program. It is not intended to diagnose MRSA infection nor to guide or monitor treatment for MRSA infections.  Radiology Studies: Dg Chest 2 View  Result Date: 12/01/2016 CLINICAL DATA:  83 year old female with productive cough and shortness of breath. EXAM: CHEST  2 VIEW COMPARISON:  None. FINDINGS: There is emphysematous changes of the lungs. Patchy areas of airspace density in the right upper lobe along the minor fissure as well as in the right middle lobe are concerning for pneumonia. Small left upper lobe hazy airspace density is also noted. There is no significant pleural effusion. No pneumothorax. Top-normal cardiac silhouette. No acute osseous pathology. IMPRESSION: Multiple area of airspace density predominantly involving the right lung likely multifocal pneumonia. Clinical correlation and follow-up to resolution recommended. Electronically Signed   By: Anner Crete M.D.   On: 12/01/2016 02:07   Dg Chest Port 1 View  Result Date: 12/02/2016 CLINICAL DATA:  Pneumonia EXAM: PORTABLE CHEST 1 VIEW COMPARISON:  December 01, 2016 FINDINGS: There is persistent airspace consolidation in both lower lobes, more on the right than on the left. There is cardiomegaly with pulmonary venous  hypertension. There is slight interstitial edema with small pleural effusions bilaterally. No evident adenopathy. No bone lesions. IMPRESSION: Findings indicative of a degree of congestive heart failure. Suspect superimposed pneumonia in the bases. Some of the opacity in the lung bases may represent alveolar edema; both pneumonia and alveolar edema may present concurrently. The appearance is similar compared to 1 day prior. Electronically Signed   By: Lowella Grip III M.D.   On: 12/02/2016 07:18   Dg Chest Port 1 View  Result Date: 12/01/2016 CLINICAL DATA:  PNEUMONIA, Reports of productive cough since Wednesday with nasal congestion. Also reports of feeling shortness of breath and nausea. HISTORY OF HTN, HIATAL HERNIA, STROKE EXAM: PORTABLE CHEST 1 VIEW COMPARISON:  12/01/2016 FINDINGS: Grossly unchanged cardiac silhouette and mediastinal contours. Pulmonary vasculature appears less distinct than present examination with cephalization of flow. Slightly nodular airspace opacity within the peripheral aspect the right lower lung is grossly unchanged. Interval development of small left-sided effusion with associated worsening left basilar opacities. A small amount of fluid is suspected within the right minor fissure. No pneumothorax. No acute osseus abnormalities. IMPRESSION: 1. Findings worrisome for pulmonary edema with development of a small left-sided effusion and worsening left basilar opacities, atelectasis versus infiltrate. 2. Grossly unchanged ill-defined heterogeneous airspace opacity with the peripheral aspect the right lower lung worrisome for infection. Continued attention on follow-up is recommended. Electronically Signed   By: Sandi Mariscal M.D.   On: 12/01/2016 17:14        Scheduled Meds: . amLODipine  5 mg Oral Daily  . azithromycin  500 mg Intravenous Q24H  . cefTRIAXone (ROCEPHIN)  IV  1 g Intravenous Q24H  . clopidogrel  75 mg Oral Daily  . dextromethorphan  15 mg Oral BID  .  furosemide  40 mg Intravenous BID  . gemfibrozil  600 mg Oral BID AC  . guaiFENesin  1,200 mg Oral BID  . ipratropium-albuterol  3 mL Nebulization Q6H  . trimethoprim-polymyxin b  2 drop Both Eyes Q4H   Continuous Infusions:   LOS: 1 day    Time spent: 62mins    MEMON,JEHANZEB, MD [facility] Pager [phone]  If 7PM-7AM, please contact night-coverage [email or web address] Password [email or web address] 12/02/2016, 11:12 AM

## 2016-12-03 DIAGNOSIS — I5031 Acute diastolic (congestive) heart failure: Secondary | ICD-10-CM | POA: Diagnosis present

## 2016-12-03 DIAGNOSIS — G9341 Metabolic encephalopathy: Secondary | ICD-10-CM | POA: Diagnosis not present

## 2016-12-03 LAB — CBC
HEMATOCRIT: 36.7 % (ref 36.0–46.0)
HEMOGLOBIN: 11.7 g/dL — AB (ref 12.0–15.0)
MCH: 29.8 pg (ref 26.0–34.0)
MCHC: 31.9 g/dL (ref 30.0–36.0)
MCV: 93.6 fL (ref 78.0–100.0)
Platelets: 291 10*3/uL (ref 150–400)
RBC: 3.92 MIL/uL (ref 3.87–5.11)
RDW: 14.6 % (ref 11.5–15.5)
WBC: 8.9 10*3/uL (ref 4.0–10.5)

## 2016-12-03 LAB — BASIC METABOLIC PANEL
Anion gap: 8 (ref 5–15)
BUN: 31 mg/dL — ABNORMAL HIGH (ref 6–20)
CHLORIDE: 96 mmol/L — AB (ref 101–111)
CO2: 31 mmol/L (ref 22–32)
Calcium: 8.6 mg/dL — ABNORMAL LOW (ref 8.9–10.3)
Creatinine, Ser: 1.23 mg/dL — ABNORMAL HIGH (ref 0.44–1.00)
GFR, EST AFRICAN AMERICAN: 44 mL/min — AB (ref >60)
GFR, EST NON AFRICAN AMERICAN: 38 mL/min — AB (ref >60)
Glucose, Bld: 148 mg/dL — ABNORMAL HIGH (ref 65–99)
Potassium: 4.3 mmol/L (ref 3.5–5.1)
Sodium: 135 mmol/L (ref 135–145)

## 2016-12-03 MED ORDER — ONDANSETRON HCL 4 MG/2ML IJ SOLN
4.0000 mg | Freq: Four times a day (QID) | INTRAMUSCULAR | Status: DC | PRN
  Administered 2016-12-03: 4 mg via INTRAVENOUS
  Filled 2016-12-03: qty 2

## 2016-12-03 MED ORDER — IPRATROPIUM-ALBUTEROL 0.5-2.5 (3) MG/3ML IN SOLN
3.0000 mL | Freq: Three times a day (TID) | RESPIRATORY_TRACT | Status: DC
  Administered 2016-12-03 – 2016-12-07 (×12): 3 mL via RESPIRATORY_TRACT
  Filled 2016-12-03 (×12): qty 3

## 2016-12-03 MED ORDER — LORAZEPAM 2 MG/ML IJ SOLN: 0.5000 mg | Freq: Once | INTRAMUSCULAR | Status: DC

## 2016-12-03 NOTE — Progress Notes (Signed)
Upon entering room to do pt breathing treatment noticed pt was off BIPAP machine.. Daughter stated she tried to wear but took it off a couple of times. Pt back on her 4lpm cann. Treatment given and pt tolerating well. Will continue to monitor.

## 2016-12-03 NOTE — Progress Notes (Signed)
PROGRESS NOTE    Brooke Lane  JGG:[id number] DOB: 11-23-1924 DOA: 12/01/2016 PCP: Karsten Ro, DO    Brief Narrative:  83 year old female who was brought to the hospital with a four-day history of progressive cough and shortness of breath. Found to be hypoxic on room air. Referred for further treatment of pneumonia. Became acutely lethargic and confused and was noted to be in respiratory acidosis. She was sent to the stepdown unit and started on BiPAP therapy. Chest x-ray showed pneumonia versus CHF. She is slowly improving. Currently on intravenous antibiotics and IV Lasix.   Assessment & Plan:   Principal Problem:   Community acquired pneumonia Active Problems:   Hypertension   High cholesterol   Acute respiratory failure with hypoxia (HCC)   Acute on chronic alteration in mental status   Flash pulmonary edema (HCC)   Chronic constipation   Acute respiratory failure with hypoxia and hypercarbia (HCC)   Acute respiratory failure with hypoxia and hypercarbia. Possibly precipitated by pneumonia/CHF. She had a markedly elevated PCO2 and low pH on ABG. She was started on BiPAP therapy with improvement of her PCO2. Overall mental status is improving. Will consider holding off on bipap tonight and repeating abg in AM to monitor PCO2 levels. She is still on supplemental oxygen and is being weaned down.  Metabolic encephalopathy related to respiratory acidosis. Initially improved after BiPAP therapy. Continue to monitor.  Community-acquired pneumonia. Continues to have productive cough and required supplemental oxygen. Continue intravenous antibiotics and pulmonary hygiene.  Acute diastolic congestive heart failure. Chest x-ray indicated possible CHF and she does have some peripheral edema. Was previously on Lasix, but this was changed to hydrochlorothiazide approximately one year ago. She is currently on IV lasix with good urine output. We'll continue with IV Lasix for now.  Echocardiogram shows normal EF with diastolic dysfunction.  Deconditioning. Seen by physical therapy and recommended replacement. Patient has refused.   DVT prophylaxis: Heparin Code Status: Full code Family Communication: Discussed with daughters at the bedside Disposition Plan: Discharge home when improved, patient does not wish to go to a nursing home.   Consultants:     Procedures:  Echo- Mild LVH with LVEF 60-65% and grade 1 diastolic dysfunction.   Calcified mitral annulus and aortic annulus. Trivial tricuspid    regurgitation. Unable to assess PASP.  Antimicrobials:   Azithromycin 3/20>>  Ceftriaxone 3/20>>   Subjective: Feels nauseous this morning. Did not eat much breakfast. Had difficulty tolerating bipap overnight. Feels that expiratory pressure is too high to tolerate the mask. Also, mask was hurting the bridge of her nose.  Objective: Vitals:   12/03/16 0400 12/03/16 0500 12/03/16 0718 12/03/16 0754  BP:      Pulse:   81   Resp:   (!) 33   Temp: 98.8 F (37.1 C)  98.7 F (37.1 C)   TempSrc: Oral  Axillary   SpO2:   93% 93%  Weight:  130 kg (286 lb 9.6 oz)    Height:        Intake/Output Summary (Last 24 hours) at 12/03/16 0933 Last data filed at 12/03/16 0500  Gross per 24 hour  Intake              240 ml  Output             3250 ml  Net            -3010 ml   Filed Weights   12/01/16 0107 12/03/16 0500  Weight:  113.4 kg (250 lb) 130 kg (286 lb 9.6 oz)    Examination:  General exam: Appears calm and comfortable , Mildly somnolent Respiratory system: Coarse crackles at bases, improving. Respiratory effort normal. Cardiovascular system: S1 & S2 heard, RRR. No JVD, murmurs, rubs, gallops or clicks. 1+ pedal edema. Gastrointestinal system: Abdomen is nondistended, soft and nontender. No organomegaly or masses felt. Normal bowel sounds heard. Central nervous system: Alert and oriented. No focal neurological deficits. Extremities: Symmetric 5 x 5  power. Skin: No rashes, lesions or ulcers Psychiatry: Judgement and insight appear normal. Mood & affect appropriate.     Data Reviewed: I have personally reviewed following labs and imaging studies  CBC:  Recent Labs Lab 12/01/16 0138 12/02/16 0436 12/03/16 0455  WBC 11.2* 8.1 8.9  NEUTROABS 8.9*  --   --   HGB 12.7 11.6* 11.7*  HCT 39.0 38.2 36.7  MCV 90.7 94.8 93.6  PLT 262 287 740   Basic Metabolic Panel:  Recent Labs Lab 12/01/16 0138 12/01/16 1701 12/02/16 0436 12/03/16 0455  NA 135 133* 134* 135  K 4.0 4.4 4.2 4.3  CL 99* 96* 98* 96*  CO2 26 30 29 31   GLUCOSE 166* 143* 170* 148*  BUN 30* 28* 31* 31*  CREATININE 1.32* 1.40* 1.43* 1.23*  CALCIUM 9.1 8.6* 8.4* 8.6*   GFR: Estimated Creatinine Clearance: 43.6 mL/min (A) (by C-G formula based on SCr of 1.23 mg/dL (H)). Liver Function Tests: No results for input(s): AST, ALT, ALKPHOS, BILITOT, PROT, ALBUMIN in the last 168 hours. No results for input(s): LIPASE, AMYLASE in the last 168 hours. No results for input(s): AMMONIA in the last 168 hours. Coagulation Profile: No results for input(s): INR, PROTIME in the last 168 hours. Cardiac Enzymes: No results for input(s): CKTOTAL, CKMB, CKMBINDEX, TROPONINI in the last 168 hours. BNP (last 3 results) No results for input(s): PROBNP in the last 8760 hours. HbA1C: No results for input(s): HGBA1C in the last 72 hours. CBG: No results for input(s): GLUCAP in the last 168 hours. Lipid Profile: No results for input(s): CHOL, HDL, LDLCALC, TRIG, CHOLHDL, LDLDIRECT in the last 72 hours. Thyroid Function Tests: No results for input(s): TSH, T4TOTAL, FREET4, T3FREE, THYROIDAB in the last 72 hours. Anemia Panel: No results for input(s): VITAMINB12, FOLATE, FERRITIN, TIBC, IRON, RETICCTPCT in the last 72 hours. Sepsis Labs:  Recent Labs Lab 12/01/16 8144  LATICACIDVEN 1.03    Recent Results (from the past 240 hour(s))  Culture, blood (Routine X 2) w Reflex to  ID Panel     Status: None (Preliminary result)   Collection Time: 12/01/16  3:01 AM  Result Value Ref Range Status   Specimen Description BLOOD LEFT HAND  Final   Special Requests BOTTLES DRAWN AEROBIC ONLY 2CC ONLY  Final   Culture NO GROWTH 1 DAY  Final   Report Status PENDING  Incomplete  Culture, blood (Routine X 2) w Reflex to ID Panel     Status: None (Preliminary result)   Collection Time: 12/01/16  3:08 AM  Result Value Ref Range Status   Specimen Description BLOOD RIGHT HAND  Final   Special Requests BOTTLES DRAWN AEROBIC ONLY 2CC ONLY  Final   Culture NO GROWTH 1 DAY  Final   Report Status PENDING  Incomplete  MRSA PCR Screening     Status: None   Collection Time: 12/02/16  6:30 AM  Result Value Ref Range Status   MRSA by PCR NEGATIVE NEGATIVE Final    Comment:  The GeneXpert MRSA Assay (FDA approved for NASAL specimens only), is one component of a comprehensive MRSA colonization surveillance program. It is not intended to diagnose MRSA infection nor to guide or monitor treatment for MRSA infections.          Radiology Studies: Dg Chest Port 1 View  Result Date: 12/02/2016 CLINICAL DATA:  Pneumonia EXAM: PORTABLE CHEST 1 VIEW COMPARISON:  December 01, 2016 FINDINGS: There is persistent airspace consolidation in both lower lobes, more on the right than on the left. There is cardiomegaly with pulmonary venous hypertension. There is slight interstitial edema with small pleural effusions bilaterally. No evident adenopathy. No bone lesions. IMPRESSION: Findings indicative of a degree of congestive heart failure. Suspect superimposed pneumonia in the bases. Some of the opacity in the lung bases may represent alveolar edema; both pneumonia and alveolar edema may present concurrently. The appearance is similar compared to 1 day prior. Electronically Signed   By: Lowella Grip III M.D.   On: 12/02/2016 07:18   Dg Chest Port 1 View  Result Date: 12/01/2016 CLINICAL  DATA:  PNEUMONIA, Reports of productive cough since Wednesday with nasal congestion. Also reports of feeling shortness of breath and nausea. HISTORY OF HTN, HIATAL HERNIA, STROKE EXAM: PORTABLE CHEST 1 VIEW COMPARISON:  12/01/2016 FINDINGS: Grossly unchanged cardiac silhouette and mediastinal contours. Pulmonary vasculature appears less distinct than present examination with cephalization of flow. Slightly nodular airspace opacity within the peripheral aspect the right lower lung is grossly unchanged. Interval development of small left-sided effusion with associated worsening left basilar opacities. A small amount of fluid is suspected within the right minor fissure. No pneumothorax. No acute osseus abnormalities. IMPRESSION: 1. Findings worrisome for pulmonary edema with development of a small left-sided effusion and worsening left basilar opacities, atelectasis versus infiltrate. 2. Grossly unchanged ill-defined heterogeneous airspace opacity with the peripheral aspect the right lower lung worrisome for infection. Continued attention on follow-up is recommended. Electronically Signed   By: Sandi Mariscal M.D.   On: 12/01/2016 17:14        Scheduled Meds: . amLODipine  5 mg Oral Daily  . azithromycin  500 mg Intravenous Q24H  . cefTRIAXone (ROCEPHIN)  IV  1 g Intravenous Q24H  . clopidogrel  75 mg Oral Daily  . dextromethorphan  15 mg Oral BID  . furosemide  40 mg Intravenous BID  . gemfibrozil  600 mg Oral BID AC  . guaiFENesin  1,200 mg Oral BID  . heparin subcutaneous  5,000 Units Subcutaneous Q8H  . ipratropium-albuterol  3 mL Nebulization TID  . trimethoprim-polymyxin b  2 drop Both Eyes Q4H   Continuous Infusions:   LOS: 2 days    Time spent: 76mins    Rohail Klees, MD [facility] Pager [phone]  If 7PM-7AM, please contact night-coverage [email or web address] Password [facility] 12/03/2016, 9:33 AM

## 2016-12-03 NOTE — Progress Notes (Signed)
PT Cancellation Note  Patient Details Name: Brooke Lane MRN: [id number] DOB: [birth year]   Cancelled Treatment:    Reason Eval/Treat Not Completed: Other (comment) (Family requested to hold PT today.  States that she did not have a good night last night.  Will check back on pt tomorrow. )   Beth Latavius Capizzi, PT, DPT X: [phone]

## 2016-12-04 LAB — CBC
HEMATOCRIT: 39.4 % (ref 36.0–46.0)
HEMOGLOBIN: 12.3 g/dL (ref 12.0–15.0)
MCH: 29.4 pg (ref 26.0–34.0)
MCHC: 31.2 g/dL (ref 30.0–36.0)
MCV: 94 fL (ref 78.0–100.0)
Platelets: 291 10*3/uL (ref 150–400)
RBC: 4.19 MIL/uL (ref 3.87–5.11)
RDW: 14.3 % (ref 11.5–15.5)
WBC: 8.4 10*3/uL (ref 4.0–10.5)

## 2016-12-04 LAB — BLOOD GAS, ARTERIAL
Acid-Base Excess: 11.7 mmol/L — ABNORMAL HIGH (ref 0.0–2.0)
BICARBONATE: 33.4 mmol/L — AB (ref 20.0–28.0)
Drawn by: 22223
O2 Content: 4 L/min
O2 Saturation: 92.3 %
PH ART: 7.336 — AB (ref 7.350–7.450)
PO2 ART: 66.2 mmHg — AB (ref 83.0–108.0)
pCO2 arterial: 72.8 mmHg (ref 32.0–48.0)

## 2016-12-04 LAB — BASIC METABOLIC PANEL
Anion gap: 11 (ref 5–15)
BUN: 31 mg/dL — AB (ref 6–20)
CHLORIDE: 89 mmol/L — AB (ref 101–111)
CO2: 35 mmol/L — AB (ref 22–32)
CREATININE: 1.19 mg/dL — AB (ref 0.44–1.00)
Calcium: 8.9 mg/dL (ref 8.9–10.3)
GFR calc Af Amer: 46 mL/min — ABNORMAL LOW (ref >60)
GFR calc non Af Amer: 39 mL/min — ABNORMAL LOW (ref >60)
GLUCOSE: 177 mg/dL — AB (ref 65–99)
Potassium: 4.3 mmol/L (ref 3.5–5.1)
Sodium: 135 mmol/L (ref 135–145)

## 2016-12-04 MED ORDER — ACETAZOLAMIDE SODIUM 500 MG IJ SOLR
500.0000 mg | Freq: Once | INTRAMUSCULAR | Status: AC
  Administered 2016-12-04: 500 mg via INTRAVENOUS
  Filled 2016-12-04: qty 500

## 2016-12-04 MED ORDER — LORAZEPAM 2 MG/ML IJ SOLN
0.5000 mg | Freq: Once | INTRAMUSCULAR | Status: AC
  Administered 2016-12-05: 0.5 mg via INTRAVENOUS
  Filled 2016-12-04: qty 1

## 2016-12-04 MED ORDER — METHYLPREDNISOLONE SODIUM SUCC 40 MG IJ SOLR
40.0000 mg | Freq: Two times a day (BID) | INTRAMUSCULAR | Status: DC
  Administered 2016-12-04 – 2016-12-07 (×8): 40 mg via INTRAVENOUS
  Filled 2016-12-04 (×8): qty 1

## 2016-12-04 NOTE — Progress Notes (Signed)
Placed patient on BIPAP and placed cushions over her nose bridge. Placed patient on a larger mask to reduce some of the pressure as well over her nose. I decreased her BIPAP setting for comfort and patient attempted.Patient wore the unit for about an hour and complained of a headache and discomfort. Patient was placed back on her 4lpm [place]. Will obtain a ABG on those setting later in the AM.

## 2016-12-04 NOTE — Progress Notes (Signed)
qPhysical Therapy Treatment Patient Details Name: Brooke Lane MRN: [id number] DOB: Mar 25, 1925 Today's Date: 12/04/2016    History of Present Illness 83 y.o. female with medical history significant of HTN, TIA comes in with 4 days of progressive worsening cough and sob.  She says she has been feeling awful in the last day or so but started feeling under the weather 4 days ago.  Cough is productive.  No fevers or chills.  No swelling in her legs.  No sick contacts.  No nasal congestion.  She called her PCP sat who did start her on a zpack.  Last night she kept coughing and could not sleep so came to the ED.  Mid 80s on RA with activity.  Referred for admission for bilateral pna.  No abx recently except zpack started on sat.  Dx: B CAP.  On 12/01/2016 she became more confused and found to be in respiratory acidosis. She was sent to the stepdown unit and started on BiPAP therapy. Chest x-ray showed pneumonia versus CHF. She is slowly improving. Currently on intravenous antibiotics and IV Lasix..    PT Comments    Pt received in bed and marginally agreeable to PT today.  Initially stated that she wanted to wait until her granddaughter arrived, however she was not coming until later in the day.  Pt then agreeable to getting OOB<>chair and performing exercises.  Pt's cousin, as well as dtr present during PT tx.  However, they are somewhat limiting to pt's ability to perform exercises.  Pt was able to perform a few UE and LE exercises before becoming fatigued.  Encouraged pt to stay sitting up in the chair, despite pt and family's request to have her lay back down in the bed.  Recommendation for SNF vs HHPT with 24/7 supervision/assistance.      Follow Up Recommendations  Home health PT;Supervision/Assistance - 24 hour;SNF     Equipment Recommendations  None recommended by PT    Recommendations for Other Services       Precautions / Restrictions Precautions Precautions: Fall Precaution  Comments: due to recent immobility, pt is also moderately impulsive when ambulating, and goes at a fast pace for her age. Marland Kitchen  Restrictions Weight Bearing Restrictions: No    Mobility  Bed Mobility Overal bed mobility: Needs Assistance Bed Mobility: Supine to Sit     Supine to sit: HOB elevated;Min guard        Transfers Overall transfer level: Needs assistance Equipment used: 1 person hand held assist Transfers: Stand Pivot Transfers Sit to Stand: Min assist;Mod assist         General transfer comment: Pt expressed that she was wwould be willing to get over into the chair.    Ambulation/Gait Ambulation/Gait assistance:  (Pt declined ambulation today)               Stairs            Wheelchair Mobility    Modified Rankin (Stroke Patients Only)       Balance Overall balance assessment: Needs assistance Sitting-balance support: Bilateral upper extremity supported;Feet supported Sitting balance-Leahy Scale: Good     Standing balance support: Bilateral upper extremity supported Standing balance-Leahy Scale: Fair                              Cognition Arousal/Alertness: Awake/alert Behavior During Therapy: WFL for tasks assessed/performed Overall Cognitive Status: Within Functional Limits for tasks assessed  Exercises General Exercises - Upper Extremity Shoulder Flexion: AROM;Both;10 reps;Seated General Exercises - Lower Extremity Long Arc Quad: Both;AROM;10 reps;Seated Other Exercises Other Exercises: scapular retraction x 10 reps    General Comments        Pertinent Vitals/Pain Pain Assessment: No/denies pain    Home Living                      Prior Function            PT Goals (current goals can now be found in the care plan section) Acute Rehab PT Goals Patient Stated Goal: To get stronger PT Goal Formulation: With patient/family Time For Goal  Achievement: 12/15/16 Potential to Achieve Goals: Good Progress towards PT goals: Progressing toward goals    Frequency    Min 3X/week      PT Plan Discharge plan needs to be updated    Co-evaluation             End of Session Equipment Utilized During Treatment: Oxygen;Gait belt Activity Tolerance: Patient limited by fatigue Patient left: in chair;with call bell/phone within reach;with family/visitor present Nurse Communication: Mobility status PT Visit Diagnosis: Other abnormalities of gait and mobility (R26.89);Muscle weakness (generalized) (M62.81)     Time: 2929-0903 PT Time Calculation (min) (ACUTE ONLY): 28 min  Charges:  $Therapeutic Exercise: 8-22 mins $Therapeutic Activity: 8-22 mins                    G Codes:         Beth Zakyra [place], PT, DPT X: [phone]

## 2016-12-04 NOTE — Progress Notes (Signed)
Pt and familt had refused bipap last night after pt tried it and became intolerant of the mask. After ABG this morning Panic CO2 of 72.8 I informed pt and family that we needed to put the mask back on and they agreed. Bipap is back in place and will continue to monitor pt.

## 2016-12-04 NOTE — Consult Note (Signed)
Consult requested by: Dr. Roderic Palau Consult requested for: Respiratory failure  HPI: This is an 83 year old who has a long known history of COPD and who had been doing pretty well at home. She was limited in her ability to get around to some extent and used a walker at home. She came to the hospital with community-acquired pneumonia had acute respiratory failure which was hypoxic and hypercapnic and she's been on BiPAP. She had been slowly improving she is able to communicate but her PCO2 is going up despite BiPAP. She has had some compensation and her pH is better than yesterday. Her daughters at bedside today state that although she has full CODE STATUS and they would not want her to be intubated and placed on mechanical ventilation under any circumstances. The patient is able to communicate some and she reiterates that she would not want to be intubated. She says she doesn't like the BiPAP. She feels like it's too tight. Her bypass been adjusted now by respiratory to see if she can tolerate it a little better. Her past medical history is positive for COPD stroke hypertension. Her daughter say that they think she's had more trouble with her breathing for the last several months. No chest pain. She's not had heart failure before. She's not had nausea vomiting diarrhea. No abdominal pain. No headache.  Past Medical History:  Diagnosis Date  . Hiatal hernia   . High cholesterol   . Hypertension   . Stroke [facility])      History reviewed. No pertinent family history.   Social History   Social History  . Marital status: Widowed    Spouse name: N/A  . Number of children: N/A  . Years of education: N/A   Social History Main Topics  . Smoking status: Never Smoker  . Smokeless tobacco: Never Used  . Alcohol use No  . Drug use: No  . Sexual activity: Not Asked   Other Topics Concern  . None   Social History Narrative  . None     ROS: Except as mentioned 10 point review of systems is  negative  Family history: There is no known history of any severe lung disease in the family.  Objective: Vital signs in last 24 hours: Temp:  [98 F (36.7 C)-98.9 F (37.2 C)] 98.6 F (37 C) (03/23 0400) Pulse Rate:  [82-93] 92 (03/22 2121) Resp:  [19-34] 34 (03/22 2121) BP: (122-151)/(56-81) 148/56 (03/22 1600) SpO2:  [90 %-94 %] 90 % (03/23 0800) FiO2 (%):  [35 %] 35 % (03/23 0758) Weight change:  Last BM Date: 12/03/16  Intake/Output from previous day: 03/22 0701 - 03/23 0700 In: 750 [P.O.:450; IV Piggyback:300] Out: 4825 [Urine:4825]  PHYSICAL EXAM Constitutional: She is on BiPAP. She is able to answer questions briefly. She is morbidly obese. Eyes: Pupils react. EOMI. Ears nose mouth and throat: Mucous membranes are moist. Hearing is grossly normal. Cardiovascular: Her heart is regular with normal heart sounds. Respiratory: She is on BiPAP. She has bilateral rhonchi and minimal wheeze. Gastrointestinal: Her abdomen is soft with no masses. Musculoskeletal: Normal strength in her extremities. Neurological: She is able to move all 4 extremities and I don't see any gross abnormalities. Psychiatric: She is anxious and says she's frustrated skin: Warm and dry  Lab Results: Basic Metabolic Panel:  Recent Labs  12/03/16 0455 12/04/16 0426  NA 135 135  K 4.3 4.3  CL 96* 89*  CO2 31 35*  GLUCOSE 148* 177*  BUN 31* 31*  CREATININE 1.23* 1.19*  CALCIUM 8.6* 8.9   Liver Function Tests: No results for input(s): AST, ALT, ALKPHOS, BILITOT, PROT, ALBUMIN in the last 72 hours. No results for input(s): LIPASE, AMYLASE in the last 72 hours. No results for input(s): AMMONIA in the last 72 hours. CBC:  Recent Labs  12/03/16 0455 12/04/16 0426  WBC 8.9 8.4  HGB 11.7* 12.3  HCT 36.7 39.4  MCV 93.6 94.0  PLT 291 291   Cardiac Enzymes: No results for input(s): CKTOTAL, CKMB, CKMBINDEX, TROPONINI in the last 72 hours. BNP: No results for input(s): PROBNP in the last 72  hours. D-Dimer: No results for input(s): DDIMER in the last 72 hours. CBG: No results for input(s): GLUCAP in the last 72 hours. Hemoglobin A1C: No results for input(s): HGBA1C in the last 72 hours. Fasting Lipid Panel: No results for input(s): CHOL, HDL, LDLCALC, TRIG, CHOLHDL, LDLDIRECT in the last 72 hours. Thyroid Function Tests: No results for input(s): TSH, T4TOTAL, FREET4, T3FREE, THYROIDAB in the last 72 hours. Anemia Panel: No results for input(s): VITAMINB12, FOLATE, FERRITIN, TIBC, IRON, RETICCTPCT in the last 72 hours. Coagulation: No results for input(s): LABPROT, INR in the last 72 hours. Urine Drug Screen: Drugs of Abuse  No results found for: LABOPIA, COCAINSCRNUR, LABBENZ, AMPHETMU, THCU, LABBARB  Alcohol Level: No results for input(s): ETH in the last 72 hours. Urinalysis: No results for input(s): COLORURINE, LABSPEC, PHURINE, GLUCOSEU, HGBUR, BILIRUBINUR, KETONESUR, PROTEINUR, UROBILINOGEN, NITRITE, LEUKOCYTESUR in the last 72 hours.  Invalid input(s): APPERANCEUR Misc. Labs:   ABGS:  Recent Labs  12/04/16 0550  PHART 7.336*  PO2ART 66.2*  HCO3 33.4*     MICROBIOLOGY: Recent Results (from the past 240 hour(s))  Culture, blood (Routine X 2) w Reflex to ID Panel     Status: None (Preliminary result)   Collection Time: 12/01/16  3:01 AM  Result Value Ref Range Status   Specimen Description BLOOD LEFT HAND  Final   Special Requests BOTTLES DRAWN AEROBIC ONLY 2CC ONLY  Final   Culture NO GROWTH 3 DAYS  Final   Report Status PENDING  Incomplete  Culture, blood (Routine X 2) w Reflex to ID Panel     Status: None (Preliminary result)   Collection Time: 12/01/16  3:08 AM  Result Value Ref Range Status   Specimen Description BLOOD RIGHT HAND  Final   Special Requests BOTTLES DRAWN AEROBIC ONLY 2CC ONLY  Final   Culture NO GROWTH 3 DAYS  Final   Report Status PENDING  Incomplete  MRSA PCR Screening     Status: None   Collection Time: 12/02/16  6:30 AM   Result Value Ref Range Status   MRSA by PCR NEGATIVE NEGATIVE Final    Comment:        The GeneXpert MRSA Assay (FDA approved for NASAL specimens only), is one component of a comprehensive MRSA colonization surveillance program. It is not intended to diagnose MRSA infection nor to guide or monitor treatment for MRSA infections.     Studies/Results: No results found.  Medications:  Prior to Admission:  Prescriptions Prior to Admission  Medication Sig Dispense Refill Last Dose  . acetaminophen (TYLENOL) 500 MG tablet Take 500-1,000 mg by mouth every 6 (six) hours as needed for headache.   Past Week at Unknown time  . clopidogrel (PLAVIX) 75 MG tablet Take 75 mg by mouth daily.   11/30/2016  . dextromethorphan-guaiFENesin (MUCINEX DM) 30-600 MG 12hr tablet Take 1 tablet by mouth 2 (two) times daily as  needed for cough.   11/30/2016 at Unknown time  . diphenhydrAMINE (BENADRYL) 25 mg capsule Take 25 mg by mouth every 6 (six) hours as needed for allergies.   Past Week at Unknown time  . gemfibrozil (LOPID) 600 MG tablet Take 600 mg by mouth 2 (two) times daily before a meal.   11/30/2016 at Unknown time  . losartan-hydrochlorothiazide (HYZAAR) 100-25 MG tablet Take 1 tablet by mouth daily.   11/30/2016  . Pseudoephedrine-APAP-DM (DAYQUIL PO) Take 15 mLs by mouth daily as needed (cold).   11/30/2016 at Unknown time  . sodium chloride (OCEAN) 0.65 % SOLN nasal spray Place 1 spray into both nostrils as needed for congestion.   unknown  . azithromycin (ZITHROMAX) 500 MG tablet Take 1 tablet by mouth daily.  0 Completed Course at Unknown time   Scheduled: . amLODipine  5 mg Oral Daily  . azithromycin  500 mg Intravenous Q24H  . cefTRIAXone (ROCEPHIN)  IV  1 g Intravenous Q24H  . clopidogrel  75 mg Oral Daily  . furosemide  40 mg Intravenous BID  . gemfibrozil  600 mg Oral BID AC  . guaiFENesin  1,200 mg Oral BID  . heparin subcutaneous  5,000 Units Subcutaneous Q8H  .  ipratropium-albuterol  3 mL Nebulization TID  . LORazepam  0.5 mg Intravenous Once  . trimethoprim-polymyxin b  2 drop Both Eyes Q4H   Continuous:  AOZ:HYQMVHQIONGEX, albuterol, ondansetron (ZOFRAN) IV  Assesment: She has community-acquired pneumonia. She had been more short of breath at home prior to this and she may have some element of COPD although she is a lifelong nonsmoker. She also may have some element of obesity hypoventilation. She has acute hypoxic and hypercapnic respiratory failure she's on BiPAP and her PCO2 has gone up. Her pH has improved so she's got some compensation. Family says they would not want her to be intubated and the patient confirms. She also has acute diastolic heart failure. That's being treated. Principal Problem:   Community acquired pneumonia Active Problems:   Hypertension   High cholesterol   Acute respiratory failure with hypoxia (HCC)   Acute on chronic alteration in mental status   Flash pulmonary edema (HCC)   Chronic constipation   Acute respiratory failure with hypoxia and hypercarbia (HCC)   Acute metabolic encephalopathy   Acute diastolic CHF (congestive heart failure) ([place])    Plan: Continue BiPAP. Settings are being adjusted. I gave her a single dose of Diamox IV but she may need more. Start steroids. Continue everything else.    LOS: 3 days   Breyona Swander L 12/04/2016, 8:03 AM

## 2016-12-04 NOTE — Progress Notes (Signed)
Patients CO2 was still high this morning Called Dr. Roderic Palau and asked about consult to Pulmonology which Dr. Roderic Palau  agreed. Dr. Luan Pulling on floor and I verbally gave request for consult.

## 2016-12-04 NOTE — Care Management Important Message (Signed)
Important Message  Patient Details  Name: Brooke Lane MRN: [id number] Date of Birth: 10/21/1924   Medicare Important Message Given:  Yes    Ronasia Isola, [name], RN 12/04/2016, 3:21 PM

## 2016-12-04 NOTE — Progress Notes (Signed)
PROGRESS NOTE    Brooke Lane  ZOX:[id number] DOB: 10/17/1924 DOA: 12/01/2016 PCP: Karsten Ro, DO    Brief Narrative:  83 year old female who was brought to the hospital with a four-day history of progressive cough and shortness of breath. Found to be hypoxic on room air. Referred for further treatment of pneumonia. Became acutely lethargic and confused and was noted to be in respiratory acidosis. She was sent to the stepdown unit and started on BiPAP therapy. Chest x-ray showed pneumonia versus CHF. She is slowly improving. Currently on intravenous antibiotics and IV Lasix.   Assessment & Plan:   Principal Problem:   Community acquired pneumonia Active Problems:   Hypertension   High cholesterol   Acute respiratory failure with hypoxia (HCC)   Acute on chronic alteration in mental status   Flash pulmonary edema (HCC)   Chronic constipation   Acute respiratory failure with hypoxia and hypercarbia (HCC)   Acute metabolic encephalopathy   Acute diastolic CHF (congestive heart failure) (HCC)   Acute respiratory failure with hypoxia and hypercarbia. Possibly precipitated by pneumonia/CHF. She had a markedly elevated PCO2 and low pH on ABG. She was started on BiPAP therapy with improvement of her PCO2. Overall mental status is improving. PH has improved, but PCO2 is still rising. Pulmonology following. Patient does not wish to do further bipap and does not want to be intubated. Will continue to monitor.  Metabolic encephalopathy related to respiratory acidosis. Initially improved after BiPAP therapy. Continue to monitor.  Community-acquired pneumonia. Continues to have productive cough and required supplemental oxygen. Continue intravenous antibiotics and pulmonary hygiene. Started on steroids per pulmonology  Acute diastolic congestive heart failure. Chest x-ray indicated possible CHF and she does have some peripheral edema. Was previously on Lasix as an outpatient, but this  was changed to hydrochlorothiazide approximately one year ago. She is currently on IV lasix with good urine output. Creatinine is improving. We'll continue with IV Lasix for now. Echocardiogram shows normal EF with diastolic dysfunction.  Deconditioning. Seen by physical therapy and recommended nursing facility placement. Patient has refused.   DVT prophylaxis: Heparin Code Status: DNR Family Communication: Discussed with daughters at the bedside Disposition Plan: Discharge home when improved, patient does not wish to go to a nursing home.   Consultants:   pulmonology  Procedures:  Echo- Mild LVH with LVEF 60-65% and grade 1 diastolic dysfunction.   Calcified mitral annulus and aortic annulus. Trivial tricuspid    regurgitation. Unable to assess PASP.  Antimicrobials:   Azithromycin 3/20>>  Ceftriaxone 3/20>>   Subjective: Did not tolerate bipap overnight and does not want to wear bipap anymore.  Objective: Vitals:   12/04/16 0758 12/04/16 0800 12/04/16 0900 12/04/16 1000  BP:  (!) 179/70 (!) 160/60 (!) 155/53  Pulse:  91 92 88  Resp:  (!) 26 (!) 29 (!) 34  Temp:  98.3 F (36.8 C)    TempSrc:  Axillary    SpO2: 90% 90% (!) 87% (!) 88%  Weight:      Height:        Intake/Output Summary (Last 24 hours) at 12/04/16 1056 Last data filed at 12/04/16 1003  Gross per 24 hour  Intake              550 ml  Output             6025 ml  Net            -5475 ml   [employer]  12/01/16 0107 12/03/16 0500  Weight: 113.4 kg (250 lb) 130 kg (286 lb 9.6 oz)    Examination:  General exam: Appears calm and comfortable , Mildly somnolent Respiratory system: clear bilaterally. Respiratory effort normal. Cardiovascular system: S1 & S2 heard, RRR. No JVD, murmurs, rubs, gallops or clicks. 1+ pedal edema. Gastrointestinal system: Abdomen is nondistended, soft and nontender. No organomegaly or masses felt. Normal bowel sounds heard. Central nervous system: Alert and oriented.  No focal neurological deficits. Extremities: Symmetric 5 x 5 power. Skin: No rashes, lesions or ulcers Psychiatry: Judgement and insight appear normal. Mood & affect appropriate.     Data Reviewed: I have personally reviewed following labs and imaging studies  CBC:  Recent Labs Lab 12/01/16 0138 12/02/16 0436 12/03/16 0455 12/04/16 0426  WBC 11.2* 8.1 8.9 8.4  NEUTROABS 8.9*  --   --   --   HGB 12.7 11.6* 11.7* 12.3  HCT 39.0 38.2 36.7 39.4  MCV 90.7 94.8 93.6 94.0  PLT 262 287 291 852   Basic Metabolic Panel:  Recent Labs Lab 12/01/16 0138 12/01/16 1701 12/02/16 0436 12/03/16 0455 12/04/16 0426  NA 135 133* 134* 135 135  K 4.0 4.4 4.2 4.3 4.3  CL 99* 96* 98* 96* 89*  CO2 26 30 29 31  35*  GLUCOSE 166* 143* 170* 148* 177*  BUN 30* 28* 31* 31* 31*  CREATININE 1.32* 1.40* 1.43* 1.23* 1.19*  CALCIUM 9.1 8.6* 8.4* 8.6* 8.9   GFR: Estimated Creatinine Clearance: 45 mL/min (A) (by C-G formula based on SCr of 1.19 mg/dL (H)). Liver Function Tests: No results for input(s): AST, ALT, ALKPHOS, BILITOT, PROT, ALBUMIN in the last 168 hours. No results for input(s): LIPASE, AMYLASE in the last 168 hours. No results for input(s): AMMONIA in the last 168 hours. Coagulation Profile: No results for input(s): INR, PROTIME in the last 168 hours. Cardiac Enzymes: No results for input(s): CKTOTAL, CKMB, CKMBINDEX, TROPONINI in the last 168 hours. BNP (last 3 results) No results for input(s): PROBNP in the last 8760 hours. HbA1C: No results for input(s): HGBA1C in the last 72 hours. CBG: No results for input(s): GLUCAP in the last 168 hours. Lipid Profile: No results for input(s): CHOL, HDL, LDLCALC, TRIG, CHOLHDL, LDLDIRECT in the last 72 hours. Thyroid Function Tests: No results for input(s): TSH, T4TOTAL, FREET4, T3FREE, THYROIDAB in the last 72 hours. Anemia Panel: No results for input(s): VITAMINB12, FOLATE, FERRITIN, TIBC, IRON, RETICCTPCT in the last 72 hours. Sepsis  Labs:  Recent Labs Lab 12/01/16 7782  LATICACIDVEN 1.03    Recent Results (from the past 240 hour(s))  Culture, blood (Routine X 2) w Reflex to ID Panel     Status: None (Preliminary result)   Collection Time: 12/01/16  3:01 AM  Result Value Ref Range Status   Specimen Description BLOOD LEFT HAND  Final   Special Requests BOTTLES DRAWN AEROBIC ONLY 2CC ONLY  Final   Culture NO GROWTH 3 DAYS  Final   Report Status PENDING  Incomplete  Culture, blood (Routine X 2) w Reflex to ID Panel     Status: None (Preliminary result)   Collection Time: 12/01/16  3:08 AM  Result Value Ref Range Status   Specimen Description BLOOD RIGHT HAND  Final   Special Requests BOTTLES DRAWN AEROBIC ONLY 2CC ONLY  Final   Culture NO GROWTH 3 DAYS  Final   Report Status PENDING  Incomplete  MRSA PCR Screening     Status: None   Collection Time: 12/02/16  6:30 AM  Result Value Ref Range Status   MRSA by PCR NEGATIVE NEGATIVE Final    Comment:        The GeneXpert MRSA Assay (FDA approved for NASAL specimens only), is one component of a comprehensive MRSA colonization surveillance program. It is not intended to diagnose MRSA infection nor to guide or monitor treatment for MRSA infections.          Radiology Studies: No results found.      Scheduled Meds: . acetaZOLAMIDE  500 mg Intravenous Once  . amLODipine  5 mg Oral Daily  . azithromycin  500 mg Intravenous Q24H  . cefTRIAXone (ROCEPHIN)  IV  1 g Intravenous Q24H  . clopidogrel  75 mg Oral Daily  . furosemide  40 mg Intravenous BID  . gemfibrozil  600 mg Oral BID AC  . guaiFENesin  1,200 mg Oral BID  . heparin subcutaneous  5,000 Units Subcutaneous Q8H  . ipratropium-albuterol  3 mL Nebulization TID  . LORazepam  0.5 mg Intravenous Once  . methylPREDNISolone (SOLU-MEDROL) injection  40 mg Intravenous Q12H  . trimethoprim-polymyxin b  2 drop Both Eyes Q4H   Continuous Infusions:   LOS: 3 days    Time spent:  49mins    Kanita Delage, MD [facility] Pager [phone]  If 7PM-7AM, please contact night-coverage [email or web address] Password [facility] 12/04/2016, 10:56 AM

## 2016-12-05 LAB — BASIC METABOLIC PANEL
Anion gap: 13 (ref 5–15)
BUN: 44 mg/dL — ABNORMAL HIGH (ref 6–20)
CHLORIDE: 92 mmol/L — AB (ref 101–111)
CO2: 32 mmol/L (ref 22–32)
Calcium: 9.8 mg/dL (ref 8.9–10.3)
Creatinine, Ser: 1.4 mg/dL — ABNORMAL HIGH (ref 0.44–1.00)
GFR calc non Af Amer: 32 mL/min — ABNORMAL LOW (ref >60)
GFR, EST AFRICAN AMERICAN: 37 mL/min — AB (ref >60)
Glucose, Bld: 204 mg/dL — ABNORMAL HIGH (ref 65–99)
POTASSIUM: 4.4 mmol/L (ref 3.5–5.1)
Sodium: 137 mmol/L (ref 135–145)

## 2016-12-05 LAB — BLOOD GAS, ARTERIAL
Acid-Base Excess: 6.4 mmol/L — ABNORMAL HIGH (ref 0.0–2.0)
Bicarbonate: 29.3 mmol/L — ABNORMAL HIGH (ref 20.0–28.0)
Drawn by: 221791
O2 CONTENT: 6 L/min
O2 SAT: 92.4 %
PATIENT TEMPERATURE: 37
PO2 ART: 67.8 mmHg — AB (ref 83.0–108.0)
pCO2 arterial: 50.9 mmHg — ABNORMAL HIGH (ref 32.0–48.0)
pH, Arterial: 7.403 (ref 7.350–7.450)

## 2016-12-05 MED ORDER — MAGNESIUM CITRATE PO SOLN
1.0000 | Freq: Once | ORAL | Status: AC
  Administered 2016-12-05: 1 via ORAL
  Filled 2016-12-05: qty 296

## 2016-12-05 MED ORDER — ACETYLCYSTEINE 20 % IN SOLN
2.0000 mL | Freq: Three times a day (TID) | RESPIRATORY_TRACT | Status: AC
  Administered 2016-12-05 (×2): 4 mL via RESPIRATORY_TRACT
  Administered 2016-12-05: 2 mL via RESPIRATORY_TRACT
  Filled 2016-12-05 (×3): qty 4

## 2016-12-05 NOTE — Progress Notes (Signed)
Placed patient on BiPAP with nasal mask. She has very high anxiety. Saturation is better on BiPAP. She does want to pull of mask . Daughter helping her.

## 2016-12-05 NOTE — Progress Notes (Addendum)
Patient used BiPAP till about 0200 , back on oxygen 5lpm [place]. It is doubtful we can do much more with trying to get patient to wear BiPAP. She will wear till sedation for her anxiety wears out then ask's to remove.

## 2016-12-05 NOTE — Progress Notes (Signed)
Subjective: She is more alert today. She is up sitting in a chair. She tried the BiPAP last night but she doesn't tolerate it very well. She is coughing up some sputum and it seems to be getting less. No other new complaints. No chest pain.  Objective: Vital signs in last 24 hours: Temp:  [97.4 F (36.3 C)-98.9 F (37.2 C)] 98 F (36.7 C) (03/24 0806) Pulse Rate:  [76-99] 90 (03/24 0900) Resp:  [17-42] 42 (03/24 0900) BP: (124-175)/(48-97) 141/59 (03/24 0900) SpO2:  [84 %-95 %] 91 % (03/24 0939) Weight change:  Last BM Date: 12/04/16  Intake/Output from previous day: 03/23 0701 - 03/24 0700 In: 820 [P.O.:520; IV Piggyback:300] Out: 3650 [Urine:3650]  PHYSICAL EXAM General appearance: alert, cooperative, mild distress and morbidly obese Resp: rhonchi bilaterally Cardio: regular rate and rhythm, S1, S2 normal, no murmur, click, rub or gallop GI: soft, non-tender; bowel sounds normal; no masses,  no organomegaly Extremities: She still has some edema of her legs but it looks better She has some irritated skin across her nose from the BiPAP mask.  Lab Results:  Results for orders placed or performed during the hospital encounter of 12/01/16 (from the past 48 hour(s))  CBC     Status: None   Collection Time: 12/04/16  4:26 AM  Result Value Ref Range   WBC 8.4 4.0 - 10.5 K/uL   RBC 4.19 3.87 - 5.11 MIL/uL   Hemoglobin 12.3 12.0 - 15.0 g/dL   HCT 39.4 36.0 - 46.0 %   MCV 94.0 78.0 - 100.0 fL   MCH 29.4 26.0 - 34.0 pg   MCHC 31.2 30.0 - 36.0 g/dL   RDW 14.3 11.5 - 15.5 %   Platelets 291 150 - 400 K/uL  Basic metabolic panel     Status: Abnormal   Collection Time: 12/04/16  4:26 AM  Result Value Ref Range   Sodium 135 135 - 145 mmol/L   Potassium 4.3 3.5 - 5.1 mmol/L   Chloride 89 (L) 101 - 111 mmol/L   CO2 35 (H) 22 - 32 mmol/L   Glucose, Bld 177 (H) 65 - 99 mg/dL   BUN 31 (H) 6 - 20 mg/dL   Creatinine, Ser 1.19 (H) 0.44 - 1.00 mg/dL   Calcium 8.9 8.9 - 10.3 mg/dL   GFR  calc non Af Amer 39 (L) >60 mL/min   GFR calc Af Amer 46 (L) >60 mL/min    Comment: (NOTE) The eGFR has been calculated using the CKD EPI equation. This calculation has not been validated in all clinical situations. eGFR's persistently <60 mL/min signify possible Chronic Kidney Disease.    Anion gap 11 5 - 15  Blood gas, arterial     Status: Abnormal   Collection Time: 12/04/16  5:50 AM  Result Value Ref Range   O2 Content 4.0 L/min   Delivery systems NASAL CANNULA    pH, Arterial 7.336 (L) 7.350 - 7.450   pCO2 arterial 72.8 (HH) 32.0 - 48.0 mmHg    Comment: CRITICAL RESULT CALLED TO, READ BACK BY AND VERIFIED WITH: JESSICA HEARN,RN BY K KNICK RRT,RCP ON 12/04/2016 AT 0558    pO2, Arterial 66.2 (L) 83.0 - 108.0 mmHg   Bicarbonate 33.4 (H) 20.0 - 28.0 mmol/L   Acid-Base Excess 11.7 (H) 0.0 - 2.0 mmol/L   O2 Saturation 92.3 %   Collection site RIGHT RADIAL    Drawn by [id number]    Sample type ARTERIAL    Allens test (pass/fail) PASS PASS  ABGS  Recent Labs  12/04/16 0550  PHART 7.336*  PO2ART 66.2*  HCO3 33.4*   CULTURES Recent Results (from the past 240 hour(s))  Culture, blood (Routine X 2) w Reflex to ID Panel     Status: None (Preliminary result)   Collection Time: 12/01/16  3:01 AM  Result Value Ref Range Status   Specimen Description BLOOD LEFT HAND  Final   Special Requests BOTTLES DRAWN AEROBIC ONLY 2CC ONLY  Final   Culture NO GROWTH 4 DAYS  Final   Report Status PENDING  Incomplete  Culture, blood (Routine X 2) w Reflex to ID Panel     Status: None (Preliminary result)   Collection Time: 12/01/16  3:08 AM  Result Value Ref Range Status   Specimen Description BLOOD RIGHT HAND  Final   Special Requests BOTTLES DRAWN AEROBIC ONLY 2CC ONLY  Final   Culture NO GROWTH 4 DAYS  Final   Report Status PENDING  Incomplete  MRSA PCR Screening     Status: None   Collection Time: 12/02/16  6:30 AM  Result Value Ref Range Status   MRSA by PCR NEGATIVE NEGATIVE Final     Comment:        The GeneXpert MRSA Assay (FDA approved for NASAL specimens only), is one component of a comprehensive MRSA colonization surveillance program. It is not intended to diagnose MRSA infection nor to guide or monitor treatment for MRSA infections.    Studies/Results: No results found.  Medications:  Prior to Admission:  Prescriptions Prior to Admission  Medication Sig Dispense Refill Last Dose  . acetaminophen (TYLENOL) 500 MG tablet Take 500-1,000 mg by mouth every 6 (six) hours as needed for headache.   Past Week at Unknown time  . clopidogrel (PLAVIX) 75 MG tablet Take 75 mg by mouth daily.   11/30/2016  . dextromethorphan-guaiFENesin (MUCINEX DM) 30-600 MG 12hr tablet Take 1 tablet by mouth 2 (two) times daily as needed for cough.   11/30/2016 at Unknown time  . diphenhydrAMINE (BENADRYL) 25 mg capsule Take 25 mg by mouth every 6 (six) hours as needed for allergies.   Past Week at Unknown time  . gemfibrozil (LOPID) 600 MG tablet Take 600 mg by mouth 2 (two) times daily before a meal.   11/30/2016 at Unknown time  . losartan-hydrochlorothiazide (HYZAAR) 100-25 MG tablet Take 1 tablet by mouth daily.   11/30/2016  . Pseudoephedrine-APAP-DM (DAYQUIL PO) Take 15 mLs by mouth daily as needed (cold).   11/30/2016 at Unknown time  . sodium chloride (OCEAN) 0.65 % SOLN nasal spray Place 1 spray into both nostrils as needed for congestion.   unknown  . azithromycin (ZITHROMAX) 500 MG tablet Take 1 tablet by mouth daily.  0 Completed Course at Unknown time   Scheduled: . acetylcysteine  2 mL Nebulization TID  . amLODipine  5 mg Oral Daily  . azithromycin  500 mg Intravenous Q24H  . cefTRIAXone (ROCEPHIN)  IV  1 g Intravenous Q24H  . clopidogrel  75 mg Oral Daily  . furosemide  40 mg Intravenous BID  . gemfibrozil  600 mg Oral BID AC  . guaiFENesin  1,200 mg Oral BID  . heparin subcutaneous  5,000 Units Subcutaneous Q8H  . ipratropium-albuterol  3 mL Nebulization TID  .  magnesium citrate  1 Bottle Oral Once  . methylPREDNISolone (SOLU-MEDROL) injection  40 mg Intravenous Q12H  . trimethoprim-polymyxin b  2 drop Both Eyes Q4H   Continuous:  ZYS:[id number], albuterol, ondansetron (ZOFRAN) IV  Assesment: She was admitted with community-acquired pneumonia and has acute hypoxic/hypercapnic respiratory failure. There may be some element of obesity hypoventilation. She was on BiPAP but she doesn't tolerate it well. She and her family have agreed to no CODE BLUE status and she does not want to be intubated. She is better today. Principal Problem:   Community acquired pneumonia Active Problems:   Hypertension   High cholesterol   Acute respiratory failure with hypoxia (HCC)   Acute on chronic alteration in mental status   Flash pulmonary edema (HCC)   Chronic constipation   Acute respiratory failure with hypoxia and hypercarbia (HCC)   Acute metabolic encephalopathy   Acute diastolic CHF (congestive heart failure) ([place])    Plan: Continue treatments    LOS: 4 days   Elysa Womac L 12/05/2016, 10:17 AM

## 2016-12-05 NOTE — Progress Notes (Signed)
PROGRESS NOTE    Brooke Lane  JGG:[id number] DOB: 09/25/1924 DOA: 12/01/2016 PCP: Karsten Ro, DO    Brief Narrative:  83 year old female who was brought to the hospital with a four-day history of progressive cough and shortness of breath. Found to be hypoxic on room air. Referred for further treatment of pneumonia. Became acutely lethargic and confused and was noted to be in respiratory acidosis. She was sent to the stepdown unit and started on BiPAP therapy. Chest x-ray showed pneumonia versus CHF. She is slowly improving. Currently on intravenous antibiotics and IV Lasix.   Assessment & Plan:   Principal Problem:   Community acquired pneumonia Active Problems:   Hypertension   High cholesterol   Acute respiratory failure with hypoxia (HCC)   Acute on chronic alteration in mental status   Flash pulmonary edema (HCC)   Chronic constipation   Acute respiratory failure with hypoxia and hypercarbia (HCC)   Acute metabolic encephalopathy   Acute diastolic CHF (congestive heart failure) (HCC)   Acute respiratory failure with hypoxia and hypercarbia. Possibly precipitated by pneumonia/CHF. She had a markedly elevated PCO2 and low pH on ABG. She was started on BiPAP therapy with improvement of her PCO2. Overall mental status is improving. PH has improved, but PCO2 is still rising. Repeat abg from this morning has been ordered. Pulmonology following. Since she does admit to coughing after eating and drinking, will request speech therapy input to see if aspiration is playing a role. Will continue to monitor.  Metabolic encephalopathy related to respiratory acidosis. Initially improved after BiPAP therapy. Continue to monitor.  Community-acquired pneumonia. Continues to have productive cough and required supplemental oxygen. Continue intravenous antibiotics and pulmonary hygiene. Started on steroids per pulmonology  Acute diastolic congestive heart failure. Chest x-ray indicated  possible CHF and she does have some peripheral edema. Was previously on Lasix as an outpatient, but this was changed to hydrochlorothiazide approximately one year ago. She is currently on IV lasix with good urine output. Volume status is -8.5L. Creatinine is improving. We'll continue with IV Lasix for now. Received one dose of diamox yesterday. Repeat chemistry from this morning has been ordered. Echocardiogram shows normal EF with diastolic dysfunction.  Deconditioning. Seen by physical therapy and recommended nursing facility placement. Patient has refused.   DVT prophylaxis: Heparin Code Status: DNR Family Communication: Discussed with daughters at the bedside Disposition Plan: Discharge home when improved, patient does not wish to go to a nursing home.   Consultants:   pulmonology  Procedures:  Echo- Mild LVH with LVEF 60-65% and grade 1 diastolic dysfunction.   Calcified mitral annulus and aortic annulus. Trivial tricuspid    regurgitation. Unable to assess PASP.  Antimicrobials:   Azithromycin 3/20>>  Ceftriaxone 3/20>>   Subjective: Feeling better today. Having some difficulty expectorating sputum. Overall shortness of breath improving. She does admit to coughing after eating/drinking  Objective: Vitals:   12/05/16 0800 12/05/16 0806 12/05/16 0900 12/05/16 0939  BP: (!) 153/59  (!) 141/59   Pulse: 81  90   Resp: (!) 22  (!) 42   Temp:  98 F (36.7 C)    TempSrc:  Oral    SpO2: 90%  90% 91%  Weight:      Height:        Intake/Output Summary (Last 24 hours) at 12/05/16 0955 Last data filed at 12/05/16 0900  Gross per 24 hour  Intake             1180 ml  Output             3650 ml  Net            -2470 ml   Filed Weights   12/01/16 0107 12/03/16 0500  Weight: 113.4 kg (250 lb) 130 kg (286 lb 9.6 oz)    Examination:  General exam: Appears calm and comfortable  Respiratory system: crackles at bases. Respiratory effort normal. Cardiovascular system: S1 &  S2 heard, RRR. No JVD, murmurs, rubs, gallops or clicks. 1+ pedal edema. Gastrointestinal system: Abdomen is nondistended, soft and nontender. No organomegaly or masses felt. Normal bowel sounds heard. Central nervous system: Alert and oriented. No focal neurological deficits. Extremities: Symmetric 5 x 5 power. Skin: No rashes, lesions or ulcers Psychiatry: Judgement and insight appear normal. Mood & affect appropriate.     Data Reviewed: I have personally reviewed following labs and imaging studies  CBC:  Recent Labs Lab 12/01/16 0138 12/02/16 0436 12/03/16 0455 12/04/16 0426  WBC 11.2* 8.1 8.9 8.4  NEUTROABS 8.9*  --   --   --   HGB 12.7 11.6* 11.7* 12.3  HCT 39.0 38.2 36.7 39.4  MCV 90.7 94.8 93.6 94.0  PLT 262 287 291 161   Basic Metabolic Panel:  Recent Labs Lab 12/01/16 0138 12/01/16 1701 12/02/16 0436 12/03/16 0455 12/04/16 0426  NA 135 133* 134* 135 135  K 4.0 4.4 4.2 4.3 4.3  CL 99* 96* 98* 96* 89*  CO2 26 30 29 31  35*  GLUCOSE 166* 143* 170* 148* 177*  BUN 30* 28* 31* 31* 31*  CREATININE 1.32* 1.40* 1.43* 1.23* 1.19*  CALCIUM 9.1 8.6* 8.4* 8.6* 8.9   GFR: Estimated Creatinine Clearance: 45 mL/min (A) (by C-G formula based on SCr of 1.19 mg/dL (H)). Liver Function Tests: No results for input(s): AST, ALT, ALKPHOS, BILITOT, PROT, ALBUMIN in the last 168 hours. No results for input(s): LIPASE, AMYLASE in the last 168 hours. No results for input(s): AMMONIA in the last 168 hours. Coagulation Profile: No results for input(s): INR, PROTIME in the last 168 hours. Cardiac Enzymes: No results for input(s): CKTOTAL, CKMB, CKMBINDEX, TROPONINI in the last 168 hours. BNP (last 3 results) No results for input(s): PROBNP in the last 8760 hours. HbA1C: No results for input(s): HGBA1C in the last 72 hours. CBG: No results for input(s): GLUCAP in the last 168 hours. Lipid Profile: No results for input(s): CHOL, HDL, LDLCALC, TRIG, CHOLHDL, LDLDIRECT in the last  72 hours. Thyroid Function Tests: No results for input(s): TSH, T4TOTAL, FREET4, T3FREE, THYROIDAB in the last 72 hours. Anemia Panel: No results for input(s): VITAMINB12, FOLATE, FERRITIN, TIBC, IRON, RETICCTPCT in the last 72 hours. Sepsis Labs:  Recent Labs Lab 12/01/16 0960  LATICACIDVEN 1.03    Recent Results (from the past 240 hour(s))  Culture, blood (Routine X 2) w Reflex to ID Panel     Status: None (Preliminary result)   Collection Time: 12/01/16  3:01 AM  Result Value Ref Range Status   Specimen Description BLOOD LEFT HAND  Final   Special Requests BOTTLES DRAWN AEROBIC ONLY 2CC ONLY  Final   Culture NO GROWTH 4 DAYS  Final   Report Status PENDING  Incomplete  Culture, blood (Routine X 2) w Reflex to ID Panel     Status: None (Preliminary result)   Collection Time: 12/01/16  3:08 AM  Result Value Ref Range Status   Specimen Description BLOOD RIGHT HAND  Final   Special Requests BOTTLES DRAWN AEROBIC ONLY  2CC ONLY  Final   Culture NO GROWTH 4 DAYS  Final   Report Status PENDING  Incomplete  MRSA PCR Screening     Status: None   Collection Time: 12/02/16  6:30 AM  Result Value Ref Range Status   MRSA by PCR NEGATIVE NEGATIVE Final    Comment:        The GeneXpert MRSA Assay (FDA approved for NASAL specimens only), is one component of a comprehensive MRSA colonization surveillance program. It is not intended to diagnose MRSA infection nor to guide or monitor treatment for MRSA infections.          Radiology Studies: No results found.      Scheduled Meds: . amLODipine  5 mg Oral Daily  . azithromycin  500 mg Intravenous Q24H  . cefTRIAXone (ROCEPHIN)  IV  1 g Intravenous Q24H  . clopidogrel  75 mg Oral Daily  . furosemide  40 mg Intravenous BID  . gemfibrozil  600 mg Oral BID AC  . guaiFENesin  1,200 mg Oral BID  . heparin subcutaneous  5,000 Units Subcutaneous Q8H  . ipratropium-albuterol  3 mL Nebulization TID  . methylPREDNISolone  (SOLU-MEDROL) injection  40 mg Intravenous Q12H  . trimethoprim-polymyxin b  2 drop Both Eyes Q4H   Continuous Infusions:   LOS: 4 days    Time spent: 22mins    Armanii Pressnell, MD [facility] Pager [phone]  If 7PM-7AM, please contact night-coverage [email or web address] Password [facility] 12/05/2016, 9:55 AM

## 2016-12-05 NOTE — Progress Notes (Addendum)
Patient is being tried on CPAP with /nasal pillows bled in 10 lpm oxygen. It is doubtful we could do much more for her low saturation that she will allow.

## 2016-12-06 LAB — CULTURE, BLOOD (ROUTINE X 2)
Culture: NO GROWTH
Culture: NO GROWTH

## 2016-12-06 LAB — BASIC METABOLIC PANEL
ANION GAP: 12 (ref 5–15)
BUN: 59 mg/dL — ABNORMAL HIGH (ref 6–20)
CHLORIDE: 91 mmol/L — AB (ref 101–111)
CO2: 29 mmol/L (ref 22–32)
Calcium: 9.5 mg/dL (ref 8.9–10.3)
Creatinine, Ser: 1.57 mg/dL — ABNORMAL HIGH (ref 0.44–1.00)
GFR calc Af Amer: 33 mL/min — ABNORMAL LOW (ref >60)
GFR calc non Af Amer: 28 mL/min — ABNORMAL LOW (ref >60)
GLUCOSE: 194 mg/dL — AB (ref 65–99)
POTASSIUM: 4.6 mmol/L (ref 3.5–5.1)
Sodium: 132 mmol/L — ABNORMAL LOW (ref 135–145)

## 2016-12-06 LAB — BLOOD GAS, ARTERIAL
Acid-Base Excess: 6 mmol/L — ABNORMAL HIGH (ref 0.0–2.0)
Bicarbonate: 28.8 mmol/L — ABNORMAL HIGH (ref 20.0–28.0)
Drawn by: 105551
O2 Content: 6 L/min
O2 SAT: 89.7 %
PCO2 ART: 52 mmHg — AB (ref 32.0–48.0)
PH ART: 7.391 (ref 7.350–7.450)
pO2, Arterial: 60.7 mmHg — ABNORMAL LOW (ref 83.0–108.0)

## 2016-12-06 NOTE — Progress Notes (Signed)
Subjective: She was admitted with shortness of breath and hypoxia. Is better. She was initially admitted to the floor but then developed increasing respiratory acidosis and was sent to the stepdown unit and started on BiPAP. She is not tolerating BiPAP very well. She did try nasal CPAP last night and she says that was better although respiratory therapy notes indicate that she did not do much better. She feels okay now. No new complaints. She's not coughing congested no chest pain nausea vomiting diarrhea  Objective: Vital signs in last 24 hours: Temp:  [97.7 F (36.5 C)-98.7 F (37.1 C)] 98.1 F (36.7 C) (03/25 0400) Pulse Rate:  [75-94] 75 (03/25 0700) Resp:  [20-40] 29 (03/25 0700) BP: (113-158)/(41-63) 137/55 (03/25 0943) SpO2:  [87 %-94 %] 90 % (03/25 0922) Weight:  [122.3 kg (269 lb 10 oz)] 122.3 kg (269 lb 10 oz) (03/25 0500) Weight change:  Last BM Date: 12/04/16  Intake/Output from previous day: 03/24 0701 - 03/25 0700 In: 1080 [P.O.:1080] Out: 2175 [Urine:2175]  PHYSICAL EXAM General appearance: alert, cooperative, no distress and morbidly obese Resp: Clear with decreased breath sounds Cardio: regular rate and rhythm, S1, S2 normal, no murmur, click, rub or gallop GI: soft, non-tender; bowel sounds normal; no masses,  no organomegaly Extremities: extremities normal, atraumatic, no cyanosis or edema Skin warm and dry. Mucous membranes are moist  Lab Results:  Results for orders placed or performed during the hospital encounter of 12/01/16 (from the past 48 hour(s))  Basic metabolic panel     Status: Abnormal   Collection Time: 12/05/16  9:59 AM  Result Value Ref Range   Sodium 137 135 - 145 mmol/L   Potassium 4.4 3.5 - 5.1 mmol/L   Chloride 92 (L) 101 - 111 mmol/L   CO2 32 22 - 32 mmol/L   Glucose, Bld 204 (H) 65 - 99 mg/dL   BUN 44 (H) 6 - 20 mg/dL   Creatinine, Ser 1.40 (H) 0.44 - 1.00 mg/dL   Calcium 9.8 8.9 - 10.3 mg/dL   GFR calc non Af Amer 32 (L) >60 mL/min    GFR calc Af Amer 37 (L) >60 mL/min    Comment: (NOTE) The eGFR has been calculated using the CKD EPI equation. This calculation has not been validated in all clinical situations. eGFR's persistently <60 mL/min signify possible Chronic Kidney Disease.    Anion gap 13 5 - 15  Blood gas, arterial     Status: Abnormal   Collection Time: 12/05/16 10:45 AM  Result Value Ref Range   O2 Content 6.0 L/min   Delivery systems NASAL CANNULA    pH, Arterial 7.403 7.350 - 7.450   pCO2 arterial 50.9 (H) 32.0 - 48.0 mmHg    Comment: RBV C.PHILLIPS,RN AT 1050 BY V.LAWSON,RRT ON 12/05/16   pO2, Arterial 67.8 (L) 83.0 - 108.0 mmHg   Bicarbonate 29.3 (H) 20.0 - 28.0 mmol/L   Acid-Base Excess 6.4 (H) 0.0 - 2.0 mmol/L   O2 Saturation 92.4 %   Patient temperature 37.0    Collection site RIGHT RADIAL    Drawn by [id number]    Sample type ARTERIAL    Allens test (pass/fail) PASS PASS  Basic metabolic panel     Status: Abnormal   Collection Time: 12/06/16  4:59 AM  Result Value Ref Range   Sodium 132 (L) 135 - 145 mmol/L   Potassium 4.6 3.5 - 5.1 mmol/L   Chloride 91 (L) 101 - 111 mmol/L   CO2 29 22 - 32  mmol/L   Glucose, Bld 194 (H) 65 - 99 mg/dL   BUN 59 (H) 6 - 20 mg/dL   Creatinine, Ser 1.57 (H) 0.44 - 1.00 mg/dL   Calcium 9.5 8.9 - 10.3 mg/dL   GFR calc non Af Amer 28 (L) >60 mL/min   GFR calc Af Amer 33 (L) >60 mL/min    Comment: (NOTE) The eGFR has been calculated using the CKD EPI equation. This calculation has not been validated in all clinical situations. eGFR's persistently <60 mL/min signify possible Chronic Kidney Disease.    Anion gap 12 5 - 15  Blood gas, arterial     Status: Abnormal   Collection Time: 12/06/16  5:20 AM  Result Value Ref Range   O2 Content 6.0 L/min   Delivery systems NASAL CANNULA    pH, Arterial 7.391 7.350 - 7.450   pCO2 arterial 52.0 (H) 32.0 - 48.0 mmHg   pO2, Arterial 60.7 (L) 83.0 - 108.0 mmHg   Bicarbonate 28.8 (H) 20.0 - 28.0 mmol/L   Acid-Base  Excess 6.0 (H) 0.0 - 2.0 mmol/L   O2 Saturation 89.7 %   Collection site RADIAL    Drawn by [id number]    Sample type ARTERIAL    Allens test (pass/fail) PASS PASS    ABGS  Recent Labs  12/06/16 0520  PHART 7.391  PO2ART 60.7*  HCO3 28.8*   CULTURES Recent Results (from the past 240 hour(s))  Culture, blood (Routine X 2) w Reflex to ID Panel     Status: None   Collection Time: 12/01/16  3:01 AM  Result Value Ref Range Status   Specimen Description BLOOD LEFT HAND  Final   Special Requests BOTTLES DRAWN AEROBIC ONLY 2CC ONLY  Final   Culture NO GROWTH 5 DAYS  Final   Report Status 12/06/2016 FINAL  Final  Culture, blood (Routine X 2) w Reflex to ID Panel     Status: None   Collection Time: 12/01/16  3:08 AM  Result Value Ref Range Status   Specimen Description BLOOD RIGHT HAND  Final   Special Requests BOTTLES DRAWN AEROBIC ONLY 2CC ONLY  Final   Culture NO GROWTH 5 DAYS  Final   Report Status 12/06/2016 FINAL  Final  MRSA PCR Screening     Status: None   Collection Time: 12/02/16  6:30 AM  Result Value Ref Range Status   MRSA by PCR NEGATIVE NEGATIVE Final    Comment:        The GeneXpert MRSA Assay (FDA approved for NASAL specimens only), is one component of a comprehensive MRSA colonization surveillance program. It is not intended to diagnose MRSA infection nor to guide or monitor treatment for MRSA infections.    Studies/Results: No results found.  Medications:  Prior to Admission:  Prescriptions Prior to Admission  Medication Sig Dispense Refill Last Dose  . acetaminophen (TYLENOL) 500 MG tablet Take 500-1,000 mg by mouth every 6 (six) hours as needed for headache.   Past Week at Unknown time  . clopidogrel (PLAVIX) 75 MG tablet Take 75 mg by mouth daily.   11/30/2016  . dextromethorphan-guaiFENesin (MUCINEX DM) 30-600 MG 12hr tablet Take 1 tablet by mouth 2 (two) times daily as needed for cough.   11/30/2016 at Unknown time  . diphenhydrAMINE (BENADRYL) 25 mg  capsule Take 25 mg by mouth every 6 (six) hours as needed for allergies.   Past Week at Unknown time  . gemfibrozil (LOPID) 600 MG tablet Take 600 mg by mouth  2 (two) times daily before a meal.   11/30/2016 at Unknown time  . losartan-hydrochlorothiazide (HYZAAR) 100-25 MG tablet Take 1 tablet by mouth daily.   11/30/2016  . Pseudoephedrine-APAP-DM (DAYQUIL PO) Take 15 mLs by mouth daily as needed (cold).   11/30/2016 at Unknown time  . sodium chloride (OCEAN) 0.65 % SOLN nasal spray Place 1 spray into both nostrils as needed for congestion.   unknown  . azithromycin (ZITHROMAX) 500 MG tablet Take 1 tablet by mouth daily.  0 Completed Course at Unknown time   Scheduled: . amLODipine  5 mg Oral Daily  . azithromycin  500 mg Intravenous Q24H  . cefTRIAXone (ROCEPHIN)  IV  1 g Intravenous Q24H  . clopidogrel  75 mg Oral Daily  . gemfibrozil  600 mg Oral BID AC  . guaiFENesin  1,200 mg Oral BID  . heparin subcutaneous  5,000 Units Subcutaneous Q8H  . ipratropium-albuterol  3 mL Nebulization TID  . methylPREDNISolone (SOLU-MEDROL) injection  40 mg Intravenous Q12H  . trimethoprim-polymyxin b  2 drop Both Eyes Q4H   Continuous:  YOF:[id number], albuterol, ondansetron (ZOFRAN) IV  Assesment: She was admitted with community-acquired pneumonia. She had acute hypoxic and hypercapnic respiratory failure. She did not tolerate BiPAP well. She had metabolic encephalopathy from that and that seems better. She had acute on chronic diastolic heart failure which has been treated. She is significantly improved. Her PCO2 which had been in the high 70s is now down to the low 50s. PH is normal. Principal Problem:   Community acquired pneumonia Active Problems:   Hypertension   High cholesterol   Acute respiratory failure with hypoxia (HCC)   Acute on chronic alteration in mental status   Flash pulmonary edema (HCC)   Chronic constipation   Acute respiratory failure with hypoxia and hypercarbia (HCC)    Acute metabolic encephalopathy   Acute diastolic CHF (congestive heart failure) ([place])    Plan: I agree she is okay to move out of stepdown.    LOS: 5 days   Honey Zakarian L 12/06/2016, 9:49 AM

## 2016-12-06 NOTE — Progress Notes (Signed)
Patient transferred via bed to 329. Patient tolerated well, Family at bedside. Foley pulled prior to transport. No complications.

## 2016-12-06 NOTE — Progress Notes (Signed)
PROGRESS NOTE    Brooke Lane  XEN:[id number] DOB: 1924-10-18 DOA: 12/01/2016 PCP: Karsten Ro, DO    Brief Narrative:  83 year old female who was brought to the hospital with a four-day history of progressive cough and shortness of breath. Found to be hypoxic on room air. Referred for further treatment of pneumonia. Became acutely lethargic and confused and was noted to be in respiratory acidosis. She was sent to the stepdown unit and started on BiPAP therapy. Chest x-ray showed pneumonia versus CHF. She is slowly improving. Currently on intravenous antibiotics and IV Lasix.   Assessment & Plan:   Principal Problem:   Community acquired pneumonia Active Problems:   Hypertension   High cholesterol   Acute respiratory failure with hypoxia (HCC)   Acute on chronic alteration in mental status   Flash pulmonary edema (HCC)   Chronic constipation   Acute respiratory failure with hypoxia and hypercarbia (HCC)   Acute metabolic encephalopathy   Acute diastolic CHF (congestive heart failure) (HCC)   Acute respiratory failure with hypoxia and hypercarbia. Possibly precipitated by pneumonia/CHF. She had a markedly elevated PCO2 and low pH on ABG. She was started on BiPAP therapy with improvement of her PCO2. Overall mental status is improving. PCO2 has trended down significantly. Pulmonology following. Since she does admit to coughing after eating and drinking, will request speech therapy input to see if aspiration is playing a role. Will continue to monitor.  Metabolic encephalopathy related to respiratory acidosis. Initially improved after BiPAP therapy. Continue to monitor.  Community-acquired pneumonia. Continues to have productive cough and required supplemental oxygen. Continue intravenous antibiotics and pulmonary hygiene. On steroids per pulmonology  Acute diastolic congestive heart failure. Chest x-ray indicated possible CHF and she does have some peripheral edema. Was  previously on Lasix as an outpatient, but this was changed to hydrochlorothiazide approximately one year ago. She is currently on IV lasix with good urine output. Volume status is -9.9L. Creatinine is trending up. We'll hold further lasix for now. Echocardiogram shows normal EF with diastolic dysfunction.  Deconditioning. Seen by physical therapy and recommended nursing facility placement. Patient has refused.   DVT prophylaxis: Heparin Code Status: DNR Family Communication: Discussed with daughters at the bedside Disposition Plan: Discharge home when improved, patient does not wish to go to a nursing home.   Consultants:   pulmonology  Procedures:  Echo- Mild LVH with LVEF 60-65% and grade 1 diastolic dysfunction.   Calcified mitral annulus and aortic annulus. Trivial tricuspid    regurgitation. Unable to assess PASP.  Antimicrobials:   Azithromycin 3/20>>  Ceftriaxone 3/20>>   Subjective: Feeling better today. Continues to cough, but not as productive.  Objective: Vitals:   12/06/16 0400 12/06/16 0500 12/06/16 0600 12/06/16 0700  BP: 135/60 (!) 122/51 (!) 114/49 (!) 115/48  Pulse: 86 86 77 75  Resp: (!) 34 (!) 21 (!) 27 (!) 29  Temp: 98.1 F (36.7 C)     TempSrc: Axillary     SpO2: 90% 90% (!) 89% 91%  Weight:  122.3 kg (269 lb 10 oz)    Height:        Intake/Output Summary (Last 24 hours) at 12/06/16 1031 Last data filed at 12/06/16 0500  Gross per 24 hour  Intake              720 ml  Output             2175 ml  Net            -  1455 ml   Filed Weights   12/01/16 0107 12/03/16 0500 12/06/16 0500  Weight: 113.4 kg (250 lb) 130 kg (286 lb 9.6 oz) 122.3 kg (269 lb 10 oz)    Examination:  General exam: Appears calm and comfortable  Respiratory system: coarse crackles at bases. Respiratory effort normal. Cardiovascular system: S1 & S2 heard, RRR. No JVD, murmurs, rubs, gallops or clicks. Trace pedal edema. Gastrointestinal system: Abdomen is nondistended,  soft and nontender. No organomegaly or masses felt. Normal bowel sounds heard. Central nervous system: Alert and oriented. No focal neurological deficits. Extremities: Symmetric 5 x 5 power. Skin: No rashes, lesions or ulcers Psychiatry: Judgement and insight appear normal. Mood & affect appropriate.     Data Reviewed: I have personally reviewed following labs and imaging studies  CBC:  Recent Labs Lab 12/01/16 0138 12/02/16 0436 12/03/16 0455 12/04/16 0426  WBC 11.2* 8.1 8.9 8.4  NEUTROABS 8.9*  --   --   --   HGB 12.7 11.6* 11.7* 12.3  HCT 39.0 38.2 36.7 39.4  MCV 90.7 94.8 93.6 94.0  PLT 262 287 291 254   Basic Metabolic Panel:  Recent Labs Lab 12/02/16 0436 12/03/16 0455 12/04/16 0426 12/05/16 0959 12/06/16 0459  NA 134* 135 135 137 132*  K 4.2 4.3 4.3 4.4 4.6  CL 98* 96* 89* 92* 91*  CO2 29 31 35* 32 29  GLUCOSE 170* 148* 177* 204* 194*  BUN 31* 31* 31* 44* 59*  CREATININE 1.43* 1.23* 1.19* 1.40* 1.57*  CALCIUM 8.4* 8.6* 8.9 9.8 9.5   GFR: Estimated Creatinine Clearance: 32.9 mL/min (A) (by C-G formula based on SCr of 1.57 mg/dL (H)). Liver Function Tests: No results for input(s): AST, ALT, ALKPHOS, BILITOT, PROT, ALBUMIN in the last 168 hours. No results for input(s): LIPASE, AMYLASE in the last 168 hours. No results for input(s): AMMONIA in the last 168 hours. Coagulation Profile: No results for input(s): INR, PROTIME in the last 168 hours. Cardiac Enzymes: No results for input(s): CKTOTAL, CKMB, CKMBINDEX, TROPONINI in the last 168 hours. BNP (last 3 results) No results for input(s): PROBNP in the last 8760 hours. HbA1C: No results for input(s): HGBA1C in the last 72 hours. CBG: No results for input(s): GLUCAP in the last 168 hours. Lipid Profile: No results for input(s): CHOL, HDL, LDLCALC, TRIG, CHOLHDL, LDLDIRECT in the last 72 hours. Thyroid Function Tests: No results for input(s): TSH, T4TOTAL, FREET4, T3FREE, THYROIDAB in the last 72  hours. Anemia Panel: No results for input(s): VITAMINB12, FOLATE, FERRITIN, TIBC, IRON, RETICCTPCT in the last 72 hours. Sepsis Labs:  Recent Labs Lab 12/01/16 0311  LATICACIDVEN 1.03    Recent Results (from the past 240 hour(s))  Culture, blood (Routine X 2) w Reflex to ID Panel     Status: None   Collection Time: 12/01/16  3:01 AM  Result Value Ref Range Status   Specimen Description BLOOD LEFT HAND  Final   Special Requests BOTTLES DRAWN AEROBIC ONLY 2CC ONLY  Final   Culture NO GROWTH 5 DAYS  Final   Report Status 12/06/2016 FINAL  Final  Culture, blood (Routine X 2) w Reflex to ID Panel     Status: None   Collection Time: 12/01/16  3:08 AM  Result Value Ref Range Status   Specimen Description BLOOD RIGHT HAND  Final   Special Requests BOTTLES DRAWN AEROBIC ONLY 2CC ONLY  Final   Culture NO GROWTH 5 DAYS  Final   Report Status 12/06/2016 FINAL  Final  MRSA PCR Screening     Status: None   Collection Time: 12/02/16  6:30 AM  Result Value Ref Range Status   MRSA by PCR NEGATIVE NEGATIVE Final    Comment:        The GeneXpert MRSA Assay (FDA approved for NASAL specimens only), is one component of a comprehensive MRSA colonization surveillance program. It is not intended to diagnose MRSA infection nor to guide or monitor treatment for MRSA infections.          Radiology Studies: No results found.      Scheduled Meds: . amLODipine  5 mg Oral Daily  . azithromycin  500 mg Intravenous Q24H  . cefTRIAXone (ROCEPHIN)  IV  1 g Intravenous Q24H  . clopidogrel  75 mg Oral Daily  . gemfibrozil  600 mg Oral BID AC  . guaiFENesin  1,200 mg Oral BID  . heparin subcutaneous  5,000 Units Subcutaneous Q8H  . ipratropium-albuterol  3 mL Nebulization TID  . methylPREDNISolone (SOLU-MEDROL) injection  40 mg Intravenous Q12H  . trimethoprim-polymyxin b  2 drop Both Eyes Q4H   Continuous Infusions:   LOS: 5 days    Time spent: 75mins    Juwana Thoreson, MD [facility] Pager [phone]  If 7PM-7AM, please contact night-coverage [email or web address] Password [email or web address] 12/06/2016, 9:22 AM

## 2016-12-06 NOTE — Progress Notes (Signed)
Patient stated on nasal CPAP  last night. Approximately 4 hrs Saturation 87-90 with 10 liters oxygen added. Saturation decreased when she would open mouth or had prongs not in her nose tight. She most likely did as well on oxygen.

## 2016-12-06 NOTE — Progress Notes (Signed)
Patient had BIPAP ordered in the ICU that she has been wearing but she said she doesn't tolerate it very well. Patient does not want to wear. CPAP machine taken out of room. On 5LHFNC with 02 saturations at 93%. RT will continue to monitor

## 2016-12-06 NOTE — Progress Notes (Signed)
Patient up in chair for lunch. Tolerating well. Patient ambulates to toilet with rolling walker. Tolerated well. Paged MD  regarding DC of foley catheter, MD placed order for DC.  Will pull catheter before transfer to Telemetry.

## 2016-12-07 LAB — BASIC METABOLIC PANEL
ANION GAP: 11 (ref 5–15)
BUN: 62 mg/dL — ABNORMAL HIGH (ref 6–20)
CALCIUM: 9.2 mg/dL (ref 8.9–10.3)
CHLORIDE: 95 mmol/L — AB (ref 101–111)
CO2: 28 mmol/L (ref 22–32)
Creatinine, Ser: 1.32 mg/dL — ABNORMAL HIGH (ref 0.44–1.00)
GFR calc Af Amer: 40 mL/min — ABNORMAL LOW (ref >60)
GFR calc non Af Amer: 35 mL/min — ABNORMAL LOW (ref >60)
GLUCOSE: 193 mg/dL — AB (ref 65–99)
POTASSIUM: 4.4 mmol/L (ref 3.5–5.1)
Sodium: 134 mmol/L — ABNORMAL LOW (ref 135–145)

## 2016-12-07 MED ORDER — FUROSEMIDE 40 MG PO TABS
40.0000 mg | ORAL_TABLET | Freq: Every day | ORAL | Status: DC
  Administered 2016-12-08 – 2016-12-09 (×2): 40 mg via ORAL
  Filled 2016-12-07 (×2): qty 1

## 2016-12-07 MED ORDER — DOCUSATE SODIUM 100 MG PO CAPS
100.0000 mg | ORAL_CAPSULE | Freq: Two times a day (BID) | ORAL | Status: DC
  Administered 2016-12-07 – 2016-12-09 (×5): 100 mg via ORAL
  Filled 2016-12-07 (×6): qty 1

## 2016-12-07 MED ORDER — IPRATROPIUM-ALBUTEROL 0.5-2.5 (3) MG/3ML IN SOLN
3.0000 mL | Freq: Two times a day (BID) | RESPIRATORY_TRACT | Status: DC
  Administered 2016-12-07 – 2016-12-09 (×4): 3 mL via RESPIRATORY_TRACT
  Filled 2016-12-07 (×5): qty 3

## 2016-12-07 MED ORDER — ALBUTEROL SULFATE (2.5 MG/3ML) 0.083% IN NEBU: 2.5000 mg | INHALATION_SOLUTION | Freq: Four times a day (QID) | RESPIRATORY_TRACT | Status: DC | PRN

## 2016-12-07 MED ORDER — AZITHROMYCIN 250 MG PO TABS
500.0000 mg | ORAL_TABLET | Freq: Every day | ORAL | Status: DC
  Administered 2016-12-08 – 2016-12-09 (×2): 500 mg via ORAL
  Filled 2016-12-07 (×2): qty 2

## 2016-12-07 NOTE — Progress Notes (Signed)
PHARMACIST - PHYSICIAN COMMUNICATION DR:   Roderic Palau CONCERNING: Antibiotic IV to Oral Route Change Policy  RECOMMENDATION: This patient is receiving ZITHROMAX by the intravenous route.  Based on criteria approved by the Pharmacy and Therapeutics Committee, the antibiotic(s) is/are being converted to the equivalent oral dose form(s).  DESCRIPTION: These criteria include:  Patient being treated for a respiratory tract infection, urinary tract infection, cellulitis or clostridium difficile associated diarrhea if on metronidazole  The patient is not neutropenic and does not exhibit a GI malabsorption state  The patient is eating (either orally or via tube) and/or has been taking other orally administered medications for a least 24 hours  The patient is improving clinically and has a Tmax < 100.5  If you have questions about this conversion, please contact the Pharmacy Department  [x]   [phone] )  [facility] []   [phone] )  [facility] []   [phone] )  [facility] []   [phone] )  [facility] []   [phone] )  [place], PharmD Clinical Pharmacist Pager:  [phone] 12/07/2016

## 2016-12-07 NOTE — Progress Notes (Signed)
qPhysical Therapy Treatment Patient Details Name: Brooke Lane MRN: [id number] DOB: Apr 14, 1925 Today's Date: 12/07/2016    History of Present Illness 83 y.o. female with medical history significant of HTN, TIA comes in with 4 days of progressive worsening cough and sob.  She says she has been feeling awful in the last day or so but started feeling under the weather 4 days ago.  Cough is productive.  No fevers or chills.  No swelling in her legs.  No sick contacts.  No nasal congestion.  She called her PCP sat who did start her on a zpack.  Last night she kept coughing and could not sleep so came to the ED.  Mid 80s on RA with activity.  Referred for admission for bilateral pna.  No abx recently except zpack started on sat.  Dx: B CAP.  On 12/01/2016 she became more confused and found to be in respiratory acidosis. She was sent to the stepdown unit and started on BiPAP therapy. Chest x-ray showed pneumonia versus CHF. She is slowly improving. Currently on intravenous antibiotics and IV Lasix..    PT Comments    Pt received up in the bathroom with dtr and was agreeable to PT tx.  Pt was able to ambulate 171ft in total, however, she required a seated rest break due to fatigue after 42ft.   Her SpO2 had desaturated to 83% while on 6L.  She was increased to 8L and SpO2 quickly rose to 97%, so she was returned to 6L and was able to maintain SpO2.  Upon return to the room she sat on the EOB and was able to perform UE exercises.  Continue to recommend HHPT upon d/c.     Follow Up Recommendations  Home health PT;Supervision/Assistance - 24 hour     Equipment Recommendations  Other (comment) (Wide rollator walker with the larger wheels at the front. )    Recommendations for Other Services       Precautions / Restrictions Precautions Precautions: Fall Precaution Comments: due to recent immobility, pt is also moderately impulsive when ambulating, and goes at a fast pace for her age. Marland Kitchen   Restrictions Weight Bearing Restrictions: No    Mobility  Bed Mobility Overal bed mobility: Needs Assistance Bed Mobility: Sit to Supine       Sit to supine: Min guard   General bed mobility comments: Pt required assistance to lift LE's into the bed.   Transfers Overall transfer level: Needs assistance Equipment used: 4-wheeled walker Transfers: Sit to/from Stand Sit to Stand: Min guard            Ambulation/Gait Ambulation/Gait assistance: Min guard Ambulation Distance (Feet): 100 Feet Assistive device: 4-wheeled walker Gait Pattern/deviations: Trunk flexed;Step-through pattern     General Gait Details: Pt continues with quick pace, and required seated rest break after 28ft with SpO2 found to be 83% while on 6L.  She was able to increase it quickly to 97% with PLB.   Stairs            Wheelchair Mobility    Modified Rankin (Stroke Patients Only)       Balance Overall balance assessment: Needs assistance Sitting-balance support: Bilateral upper extremity supported;Feet supported Sitting balance-Leahy Scale: Good Sitting balance - Comments: Pt requires supervision due to lethargy at this time.     Standing balance support: Bilateral upper extremity supported Standing balance-Leahy Scale: Good  Cognition Arousal/Alertness: Awake/alert Behavior During Therapy: WFL for tasks assessed/performed Overall Cognitive Status: Within Functional Limits for tasks assessed                                        Exercises General Exercises - Upper Extremity Shoulder Flexion: AROM;Both;10 reps;Seated Shoulder ADduction: Strengthening;Both;10 reps;Seated Other Exercises Other Exercises: scapular retraction x 10 reps Other Exercises: 5 x sit<>stand with min guard.     General Comments        Pertinent Vitals/Pain Pain Assessment: No/denies pain    Home Living                      Prior  Function            PT Goals (current goals can now be found in the care plan section) Acute Rehab PT Goals Patient Stated Goal: To get stronger PT Goal Formulation: With patient/family Time For Goal Achievement: 12/15/16 Potential to Achieve Goals: Good Progress towards PT goals: Progressing toward goals    Frequency    Min 3X/week      PT Plan Discharge plan needs to be updated    Co-evaluation             End of Session Equipment Utilized During Treatment: Oxygen;Gait belt Activity Tolerance: Patient limited by fatigue Patient left: in chair;with call bell/phone within reach;with family/visitor present Nurse Communication: Mobility status PT Visit Diagnosis: Other abnormalities of gait and mobility (R26.89);Muscle weakness (generalized) (M62.81)     Time: 6073-7106 PT Time Calculation (min) (ACUTE ONLY): 25 min  Charges:  $Gait Training: 8-22 mins $Therapeutic Exercise: 8-22 mins                    G Codes:       Beth Jos Cygan, PT, DPT X: [phone]

## 2016-12-07 NOTE — Evaluation (Signed)
Clinical/Bedside Swallow Evaluation Patient Details  Name: Brooke Lane MRN: [id number] Date of Birth: 08-Feb-1925  Today's Date: 12/07/2016 Time: SLP Start Time (ACUTE ONLY): 1000 SLP Stop Time (ACUTE ONLY): 1036 SLP Time Calculation (min) (ACUTE ONLY): 36 min  Past Medical History:  Past Medical History:  Diagnosis Date  . Hiatal hernia   . High cholesterol   . Hypertension   . Stroke [facility])    Past Surgical History:  Past Surgical History:  Procedure Laterality Date  . BREAST LUMPECTOMY    . TONSILLECTOMY     HPI:  83 year old female who was brought to the hospital with a four-day history of progressive cough and shortness of breath. Found to be hypoxic on room air. Referred for further treatment of pneumonia. Became acutely lethargic and confused and was noted to be in respiratory acidosis. She was sent to the stepdown unit and started on BiPAP therapy. Chest x-ray showed pneumonia versus CHF. She is slowly improving. Currently on intravenous antibiotics and IV Lasix.   Assessment / Plan / Recommendation Clinical Impression  Pt seen for clinical swallow evaluation while seated upright in chair. Her granddaughter was present. Pt denies difficulty swallowing, but does state occasional difficulty taking large pills. Pt denies frequent PNA or URI episodes prior to this acute event. She sleeps in a recliner at home and consumes all meals upright in a chair. Oral motor examination completed and found to be WNL. Pt with mild hoarse/dry vocal quality likely secondary to recent O2 demands with BiPAP. Pt observed self feeding ice chips, water via cup and straw, puree, and regular textures without incident and no overt s/sx aspiration or airway compromise. SLP educated Pt and family (granddaughter) on signs and symptoms of aspiration, reflux precautions, and general aspiration precautions. Pt/family acknowledged understanding and responded appropriately with questions. No further SLP services  indicated at this time. SLP will sign off. Above to RN.  SLP Visit Diagnosis: Dysphagia, oropharyngeal phase (R13.12)    Aspiration Risk  Mild aspiration risk    Diet Recommendation Regular;Thin liquid   Liquid Administration via: Cup;Straw Medication Administration: Whole meds with liquid Supervision: Patient able to self feed Postural Changes: Seated upright at 90 degrees;Remain upright for at least 30 minutes after po intake    Other  Recommendations Oral Care Recommendations: Oral care BID;Patient independent with oral care Other Recommendations: Clarify dietary restrictions   Follow up Recommendations None      Frequency and Duration  N/A          Prognosis Prognosis for Safe Diet Advancement: Good      Swallow Study   General Date of Onset: 12/01/16 HPI: 83 year old female who was brought to the hospital with a four-day history of progressive cough and shortness of breath. Found to be hypoxic on room air. Referred for further treatment of pneumonia. Became acutely lethargic and confused and was noted to be in respiratory acidosis. She was sent to the stepdown unit and started on BiPAP therapy. Chest x-ray showed pneumonia versus CHF. She is slowly improving. Currently on intravenous antibiotics and IV Lasix. Type of Study: Bedside Swallow Evaluation Previous Swallow Assessment: None on record Diet Prior to this Study: Regular;Thin liquids Temperature Spikes Noted: No Respiratory Status: Nasal cannula History of Recent Intubation: No Behavior/Cognition: Alert;Cooperative;Pleasant mood Oral Cavity Assessment: Within Functional Limits Oral Care Completed by SLP: No Oral Cavity - Dentition: Adequate natural dentition Vision: Functional for self-feeding Self-Feeding Abilities: Able to feed self Patient Positioning: Upright in chair Baseline Vocal Quality: Normal  Volitional Cough: Strong Volitional Swallow: Able to elicit    Oral/Motor/Sensory Function Overall Oral  Motor/Sensory Function: Within functional limits   Ice Chips Ice chips: Within functional limits Presentation: Spoon   Thin Liquid Thin Liquid: Within functional limits Presentation: Cup;Self Fed;Straw    Nectar Thick Nectar Thick Liquid: Not tested   Honey Thick Honey Thick Liquid: Not tested   Puree Puree: Within functional limits Presentation: Self Fed;Spoon   Solid   Thank you,  Genene Churn, CCC-SLP [phone]    Solid: Within functional limits Presentation: Self Fed        Brooke Lane 12/07/2016,10:58 AM

## 2016-12-07 NOTE — Progress Notes (Signed)
Pt went walking with PT and her family. O2 sats dropped to 83 on 6L O2 after walking up and down hallway. After allowing patient to rest for a few minutes and bumping her O2 to 8L she immediately came back up to mid 90's. Pt then returned to room on 6L O2 and tolerated it well. In bed patient is on 4L [place] and tolerating well.  Will continue to monitor.  Margaret Pyle, RN

## 2016-12-07 NOTE — Progress Notes (Signed)
Pts daughters asked to hold off on weight pt on the scale until a later time.

## 2016-12-07 NOTE — Progress Notes (Signed)
PROGRESS NOTE    Brooke Lane  ENI:[id number] DOB: 1924-10-05 DOA: 12/01/2016 PCP: Karsten Ro, DO    Brief Narrative:  83 year old female who was brought to the hospital with a four-day history of progressive cough and shortness of breath. Found to be hypoxic on room air. Referred for further treatment of pneumonia. Became acutely lethargic and confused and was noted to be in respiratory acidosis. She was sent to the stepdown unit and started on BiPAP therapy. Chest x-ray showed pneumonia versus CHF. She is slowly improving. Currently on intravenous antibiotics and IV Lasix.   Assessment & Plan:   Principal Problem:   Community acquired pneumonia Active Problems:   Hypertension   High cholesterol   Acute respiratory failure with hypoxia (HCC)   Acute on chronic alteration in mental status   Flash pulmonary edema (HCC)   Chronic constipation   Acute respiratory failure with hypoxia and hypercarbia (HCC)   Acute metabolic encephalopathy   Acute diastolic CHF (congestive heart failure) (HCC)   Acute respiratory failure with hypoxia and hypercarbia. Possibly precipitated by pneumonia/CHF. She had a markedly elevated PCO2 and low pH on ABG. She was started on BiPAP therapy with improvement of her PCO2. Overall mental status is improving. PCO2 has trended down significantly. Pulmonology following. Evaluated by speech therapy and not felt to have any aspiration. She is still requiring high flow oxygen with minimal exertion. Will continue to wean oxygen as tolerated. Will continue to monitor.  Metabolic encephalopathy related to respiratory acidosis. Initially improved after BiPAP therapy. Now back to baseline. Continue to monitor.  Community-acquired pneumonia. Continues to have productive cough and required supplemental oxygen. Continue intravenous antibiotics and pulmonary hygiene. On steroids per pulmonology  Acute diastolic congestive heart failure. Chest x-ray indicated  possible CHF and she does have some peripheral edema. Was previously on Lasix as an outpatient, but this was changed to hydrochlorothiazide approximately one year ago. She is currently on IV lasix with good urine output. Volume status is -9.9L. Creatinine was trending up, so Iv lasix was discontinued. Will start on po lasix from AM. Echocardiogram shows normal EF with diastolic dysfunction.  Deconditioning. Seen by physical therapy and recommended nursing facility placement. Patient has refused.   DVT prophylaxis: Heparin Code Status: DNR Family Communication: Discussed with daughters at the bedside Disposition Plan: Discharge home when improved, patient does not wish to go to a nursing home.   Consultants:   pulmonology  Procedures:  Echo- Mild LVH with LVEF 60-65% and grade 1 diastolic dysfunction.   Calcified mitral annulus and aortic annulus. Trivial tricuspid    regurgitation. Unable to assess PASP.  Antimicrobials:   Azithromycin 3/20>>  Ceftriaxone 3/20>>   Subjective: Breathing improving. Had bowel movement today. Feeling better.  Objective: Vitals:   12/07/16 0918 12/07/16 0923 12/07/16 1314 12/07/16 1549  BP:   (!) 135/58   Pulse:   86   Resp:      Temp:   98.6 F (37 C)   TempSrc:   Oral   SpO2: 94%  92% (!) 83%  Weight:  120.5 kg (265 lb 11.2 oz)    Height:        Intake/Output Summary (Last 24 hours) at 12/07/16 1655 Last data filed at 12/07/16 0439  Gross per 24 hour  Intake              800 ml  Output              800 ml  Net  0 ml   Filed Weights   12/03/16 0500 12/06/16 0500 12/07/16 0923  Weight: 130 kg (286 lb 9.6 oz) 122.3 kg (269 lb 10 oz) 120.5 kg (265 lb 11.2 oz)    Examination:  General exam: Appears calm and comfortable  Respiratory system: coarse crackles at bases. Respiratory effort normal. Cardiovascular system: S1 & S2 heard, RRR. No JVD, murmurs, rubs, gallops or clicks. Trace pedal edema. Gastrointestinal  system: Abdomen is nondistended, soft and nontender. No organomegaly or masses felt. Normal bowel sounds heard. Central nervous system: Alert and oriented. No focal neurological deficits. Extremities: Symmetric 5 x 5 power. Skin: No rashes, lesions or ulcers Psychiatry: Judgement and insight appear normal. Mood & affect appropriate.     Data Reviewed: I have personally reviewed following labs and imaging studies  CBC:  Recent Labs Lab 12/01/16 0138 12/02/16 0436 12/03/16 0455 12/04/16 0426  WBC 11.2* 8.1 8.9 8.4  NEUTROABS 8.9*  --   --   --   HGB 12.7 11.6* 11.7* 12.3  HCT 39.0 38.2 36.7 39.4  MCV 90.7 94.8 93.6 94.0  PLT 262 287 291 740   Basic Metabolic Panel:  Recent Labs Lab 12/03/16 0455 12/04/16 0426 12/05/16 0959 12/06/16 0459 12/07/16 0440  NA 135 135 137 132* 134*  K 4.3 4.3 4.4 4.6 4.4  CL 96* 89* 92* 91* 95*  CO2 31 35* 32 29 28  GLUCOSE 148* 177* 204* 194* 193*  BUN 31* 31* 44* 59* 62*  CREATININE 1.23* 1.19* 1.40* 1.57* 1.32*  CALCIUM 8.6* 8.9 9.8 9.5 9.2   GFR: Estimated Creatinine Clearance: 38.9 mL/min (A) (by C-G formula based on SCr of 1.32 mg/dL (H)). Liver Function Tests: No results for input(s): AST, ALT, ALKPHOS, BILITOT, PROT, ALBUMIN in the last 168 hours. No results for input(s): LIPASE, AMYLASE in the last 168 hours. No results for input(s): AMMONIA in the last 168 hours. Coagulation Profile: No results for input(s): INR, PROTIME in the last 168 hours. Cardiac Enzymes: No results for input(s): CKTOTAL, CKMB, CKMBINDEX, TROPONINI in the last 168 hours. BNP (last 3 results) No results for input(s): PROBNP in the last 8760 hours. HbA1C: No results for input(s): HGBA1C in the last 72 hours. CBG: No results for input(s): GLUCAP in the last 168 hours. Lipid Profile: No results for input(s): CHOL, HDL, LDLCALC, TRIG, CHOLHDL, LDLDIRECT in the last 72 hours. Thyroid Function Tests: No results for input(s): TSH, T4TOTAL, FREET4, T3FREE,  THYROIDAB in the last 72 hours. Anemia Panel: No results for input(s): VITAMINB12, FOLATE, FERRITIN, TIBC, IRON, RETICCTPCT in the last 72 hours. Sepsis Labs:  Recent Labs Lab 12/01/16 0311  LATICACIDVEN 1.03    Recent Results (from the past 240 hour(s))  Culture, blood (Routine X 2) w Reflex to ID Panel     Status: None   Collection Time: 12/01/16  3:01 AM  Result Value Ref Range Status   Specimen Description BLOOD LEFT HAND  Final   Special Requests BOTTLES DRAWN AEROBIC ONLY 2CC ONLY  Final   Culture NO GROWTH 5 DAYS  Final   Report Status 12/06/2016 FINAL  Final  Culture, blood (Routine X 2) w Reflex to ID Panel     Status: None   Collection Time: 12/01/16  3:08 AM  Result Value Ref Range Status   Specimen Description BLOOD RIGHT HAND  Final   Special Requests BOTTLES DRAWN AEROBIC ONLY 2CC ONLY  Final   Culture NO GROWTH 5 DAYS  Final   Report Status 12/06/2016 FINAL  Final  MRSA PCR Screening     Status: None   Collection Time: 12/02/16  6:30 AM  Result Value Ref Range Status   MRSA by PCR NEGATIVE NEGATIVE Final    Comment:        The GeneXpert MRSA Assay (FDA approved for NASAL specimens only), is one component of a comprehensive MRSA colonization surveillance program. It is not intended to diagnose MRSA infection nor to guide or monitor treatment for MRSA infections.          Radiology Studies: No results found.      Scheduled Meds: . amLODipine  5 mg Oral Daily  . [START ON 12/08/2016] azithromycin  500 mg Oral Daily  . cefTRIAXone (ROCEPHIN)  IV  1 g Intravenous Q24H  . clopidogrel  75 mg Oral Daily  . docusate sodium  100 mg Oral BID  . gemfibrozil  600 mg Oral BID AC  . guaiFENesin  1,200 mg Oral BID  . heparin subcutaneous  5,000 Units Subcutaneous Q8H  . ipratropium-albuterol  3 mL Nebulization BID  . methylPREDNISolone (SOLU-MEDROL) injection  40 mg Intravenous Q12H  . trimethoprim-polymyxin b  2 drop Both Eyes Q4H   Continuous  Infusions:   LOS: 6 days    Time spent: 27mins    Lawrence Roldan, MD [facility] Pager [phone]  If 7PM-7AM, please contact night-coverage [email or web address] Password [email or web address] 12/07/2016, 4:55 PM

## 2016-12-07 NOTE — Progress Notes (Signed)
Subjective: She says she feels better. She has been transferred out of stepdown. She had a good night. Although she used CPAP with nasal pillows briefly she says that she would not use that at home. She feels like she's getting better on current treatments. She says she would use oxygen at home.  Objective: Vital signs in last 24 hours: Temp:  [97.5 F (36.4 C)-98.4 F (36.9 C)] 98.4 F (36.9 C) (03/26 0520) Pulse Rate:  [74-95] 74 (03/26 0520) Resp:  [19-34] 20 (03/26 0520) BP: (111-146)/(50-100) 137/57 (03/26 0520) SpO2:  [87 %-93 %] 92 % (03/26 0520) Weight change:  Last BM Date: 12/06/16  Intake/Output from previous day: 03/25 0701 - 03/26 0700 In: 1520 [P.O.:920; IV Piggyback:600] Out: 1350 [Urine:1350]  PHYSICAL EXAM General appearance: alert, cooperative and morbidly obese Resp: rhonchi bilaterally Cardio: regular rate and rhythm, S1, S2 normal, no murmur, click, rub or gallop GI: soft, non-tender; bowel sounds normal; no masses,  no organomegaly Extremities: venous stasis dermatitis noted Skin warm and dry. Mucous membranes are moist neurologically intact  Lab Results:  Results for orders placed or performed during the hospital encounter of 12/01/16 (from the past 48 hour(s))  Basic metabolic panel     Status: Abnormal   Collection Time: 12/05/16  9:59 AM  Result Value Ref Range   Sodium 137 135 - 145 mmol/L   Potassium 4.4 3.5 - 5.1 mmol/L   Chloride 92 (L) 101 - 111 mmol/L   CO2 32 22 - 32 mmol/L   Glucose, Bld 204 (H) 65 - 99 mg/dL   BUN 44 (H) 6 - 20 mg/dL   Creatinine, Ser 1.40 (H) 0.44 - 1.00 mg/dL   Calcium 9.8 8.9 - 10.3 mg/dL   GFR calc non Af Amer 32 (L) >60 mL/min   GFR calc Af Amer 37 (L) >60 mL/min    Comment: (NOTE) The eGFR has been calculated using the CKD EPI equation. This calculation has not been validated in all clinical situations. eGFR's persistently <60 mL/min signify possible Chronic Kidney Disease.    Anion gap 13 5 - 15  Blood gas,  arterial     Status: Abnormal   Collection Time: 12/05/16 10:45 AM  Result Value Ref Range   O2 Content 6.0 L/min   Delivery systems NASAL CANNULA    pH, Arterial 7.403 7.350 - 7.450   pCO2 arterial 50.9 (H) 32.0 - 48.0 mmHg    Comment: RBV C.PHILLIPS,RN AT 1050 BY V.LAWSON,RRT ON 12/05/16   pO2, Arterial 67.8 (L) 83.0 - 108.0 mmHg   Bicarbonate 29.3 (H) 20.0 - 28.0 mmol/L   Acid-Base Excess 6.4 (H) 0.0 - 2.0 mmol/L   O2 Saturation 92.4 %   Patient temperature 37.0    Collection site RIGHT RADIAL    Drawn by [id number]    Sample type ARTERIAL    Allens test (pass/fail) PASS PASS  Basic metabolic panel     Status: Abnormal   Collection Time: 12/06/16  4:59 AM  Result Value Ref Range   Sodium 132 (L) 135 - 145 mmol/L   Potassium 4.6 3.5 - 5.1 mmol/L   Chloride 91 (L) 101 - 111 mmol/L   CO2 29 22 - 32 mmol/L   Glucose, Bld 194 (H) 65 - 99 mg/dL   BUN 59 (H) 6 - 20 mg/dL   Creatinine, Ser 1.57 (H) 0.44 - 1.00 mg/dL   Calcium 9.5 8.9 - 10.3 mg/dL   GFR calc non Af Amer 28 (L) >60 mL/min   GFR  calc Af Amer 33 (L) >60 mL/min    Comment: (NOTE) The eGFR has been calculated using the CKD EPI equation. This calculation has not been validated in all clinical situations. eGFR's persistently <60 mL/min signify possible Chronic Kidney Disease.    Anion gap 12 5 - 15  Blood gas, arterial     Status: Abnormal   Collection Time: 12/06/16  5:20 AM  Result Value Ref Range   O2 Content 6.0 L/min   Delivery systems NASAL CANNULA    pH, Arterial 7.391 7.350 - 7.450   pCO2 arterial 52.0 (H) 32.0 - 48.0 mmHg   pO2, Arterial 60.7 (L) 83.0 - 108.0 mmHg   Bicarbonate 28.8 (H) 20.0 - 28.0 mmol/L   Acid-Base Excess 6.0 (H) 0.0 - 2.0 mmol/L   O2 Saturation 89.7 %   Collection site RADIAL    Drawn by [id number]    Sample type ARTERIAL    Allens test (pass/fail) PASS PASS  Basic metabolic panel     Status: Abnormal   Collection Time: 12/07/16  4:40 AM  Result Value Ref Range   Sodium 134 (L) 135 - 145  mmol/L   Potassium 4.4 3.5 - 5.1 mmol/L   Chloride 95 (L) 101 - 111 mmol/L   CO2 28 22 - 32 mmol/L   Glucose, Bld 193 (H) 65 - 99 mg/dL   BUN 62 (H) 6 - 20 mg/dL   Creatinine, Ser 1.32 (H) 0.44 - 1.00 mg/dL   Calcium 9.2 8.9 - 10.3 mg/dL   GFR calc non Af Amer 35 (L) >60 mL/min   GFR calc Af Amer 40 (L) >60 mL/min    Comment: (NOTE) The eGFR has been calculated using the CKD EPI equation. This calculation has not been validated in all clinical situations. eGFR's persistently <60 mL/min signify possible Chronic Kidney Disease.    Anion gap 11 5 - 15    ABGS  Recent Labs  12/06/16 0520  PHART 7.391  PO2ART 60.7*  HCO3 28.8*   CULTURES Recent Results (from the past 240 hour(s))  Culture, blood (Routine X 2) w Reflex to ID Panel     Status: None   Collection Time: 12/01/16  3:01 AM  Result Value Ref Range Status   Specimen Description BLOOD LEFT HAND  Final   Special Requests BOTTLES DRAWN AEROBIC ONLY 2CC ONLY  Final   Culture NO GROWTH 5 DAYS  Final   Report Status 12/06/2016 FINAL  Final  Culture, blood (Routine X 2) w Reflex to ID Panel     Status: None   Collection Time: 12/01/16  3:08 AM  Result Value Ref Range Status   Specimen Description BLOOD RIGHT HAND  Final   Special Requests BOTTLES DRAWN AEROBIC ONLY 2CC ONLY  Final   Culture NO GROWTH 5 DAYS  Final   Report Status 12/06/2016 FINAL  Final  MRSA PCR Screening     Status: None   Collection Time: 12/02/16  6:30 AM  Result Value Ref Range Status   MRSA by PCR NEGATIVE NEGATIVE Final    Comment:        The GeneXpert MRSA Assay (FDA approved for NASAL specimens only), is one component of a comprehensive MRSA colonization surveillance program. It is not intended to diagnose MRSA infection nor to guide or monitor treatment for MRSA infections.    Studies/Results: No results found.  Medications:  Prior to Admission:  Prescriptions Prior to Admission  Medication Sig Dispense Refill Last Dose  .  acetaminophen (TYLENOL)  500 MG tablet Take 500-1,000 mg by mouth every 6 (six) hours as needed for headache.   Past Week at Unknown time  . clopidogrel (PLAVIX) 75 MG tablet Take 75 mg by mouth daily.   11/30/2016  . dextromethorphan-guaiFENesin (MUCINEX DM) 30-600 MG 12hr tablet Take 1 tablet by mouth 2 (two) times daily as needed for cough.   11/30/2016 at Unknown time  . diphenhydrAMINE (BENADRYL) 25 mg capsule Take 25 mg by mouth every 6 (six) hours as needed for allergies.   Past Week at Unknown time  . gemfibrozil (LOPID) 600 MG tablet Take 600 mg by mouth 2 (two) times daily before a meal.   11/30/2016 at Unknown time  . losartan-hydrochlorothiazide (HYZAAR) 100-25 MG tablet Take 1 tablet by mouth daily.   11/30/2016  . Pseudoephedrine-APAP-DM (DAYQUIL PO) Take 15 mLs by mouth daily as needed (cold).   11/30/2016 at Unknown time  . sodium chloride (OCEAN) 0.65 % SOLN nasal spray Place 1 spray into both nostrils as needed for congestion.   unknown  . azithromycin (ZITHROMAX) 500 MG tablet Take 1 tablet by mouth daily.  0 Completed Course at Unknown time   Scheduled: . amLODipine  5 mg Oral Daily  . azithromycin  500 mg Intravenous Q24H  . cefTRIAXone (ROCEPHIN)  IV  1 g Intravenous Q24H  . clopidogrel  75 mg Oral Daily  . gemfibrozil  600 mg Oral BID AC  . guaiFENesin  1,200 mg Oral BID  . heparin subcutaneous  5,000 Units Subcutaneous Q8H  . ipratropium-albuterol  3 mL Nebulization TID  . methylPREDNISolone (SOLU-MEDROL) injection  40 mg Intravenous Q12H  . trimethoprim-polymyxin b  2 drop Both Eyes Q4H   Continuous:  [id number], albuterol, ondansetron (ZOFRAN) IV  Assesment: She was admitted with community-acquired pneumonia. She developed acute hypoxic and hypercapnic respiratory failure. She had significant metabolic encephalopathy from that. She had acute diastolic heart failure which has improved. Her respiratory failure is markedly improved. She is much more alert. She  probably has some element of obesity hypoventilation/sleep apnea but she is resistant to treatment for that. Principal Problem:   Community acquired pneumonia Active Problems:   Hypertension   High cholesterol   Acute respiratory failure with hypoxia (HCC)   Acute on chronic alteration in mental status   Flash pulmonary edema (HCC)   Chronic constipation   Acute respiratory failure with hypoxia and hypercarbia (HCC)   Acute metabolic encephalopathy   Acute diastolic CHF (congestive heart failure) ([place])    Plan: Continue treatments. No change in meds.    LOS: 6 days   Genevie Elman L 12/07/2016, 8:37 AM

## 2016-12-08 LAB — BASIC METABOLIC PANEL
Anion gap: 10 (ref 5–15)
BUN: 54 mg/dL — AB (ref 6–20)
CALCIUM: 9.2 mg/dL (ref 8.9–10.3)
CO2: 27 mmol/L (ref 22–32)
CREATININE: 1.17 mg/dL — AB (ref 0.44–1.00)
Chloride: 99 mmol/L — ABNORMAL LOW (ref 101–111)
GFR calc Af Amer: 46 mL/min — ABNORMAL LOW (ref >60)
GFR, EST NON AFRICAN AMERICAN: 40 mL/min — AB (ref >60)
Glucose, Bld: 165 mg/dL — ABNORMAL HIGH (ref 65–99)
Potassium: 5 mmol/L (ref 3.5–5.1)
SODIUM: 136 mmol/L (ref 135–145)

## 2016-12-08 MED ORDER — CEFUROXIME AXETIL 250 MG PO TABS
500.0000 mg | ORAL_TABLET | Freq: Two times a day (BID) | ORAL | Status: DC
  Administered 2016-12-08 – 2016-12-09 (×3): 500 mg via ORAL
  Filled 2016-12-08 (×3): qty 2

## 2016-12-08 MED ORDER — PREDNISONE 20 MG PO TABS
40.0000 mg | ORAL_TABLET | Freq: Every day | ORAL | Status: DC
  Administered 2016-12-08 – 2016-12-09 (×2): 40 mg via ORAL
  Filled 2016-12-08 (×2): qty 2

## 2016-12-08 NOTE — Care Management Note (Addendum)
Case Management Note  Patient Details  Name: Brooke Lane MRN: [id number] Date of Birth: Apr 24, 1925   Expected Discharge Date:     12/09/2016             Expected Discharge Plan:  [place]  In-House Referral:  Clinical Social Work  Discharge planning Services  CM Consult  Post Acute Care Choice:  Durable Medical Equipment Choice offered to:  Patient, Adult Children  DME Arranged:  Oxygen DME Agency:  [place]:  PT [facility] Agency:  [place]  Status of Service:  In process, will continue to follow  If discussed at Long Length of Stay Meetings, dates discussed:  12/08/2016  Additional Comments:  [place] DC home tomorrow. Pt agreeable to [facility] PT. Anticipate pt will qualify for supplemental oxygen. Pt/family has chosen [facility] from list of [facility] and DME providers. Family aware HH has 48hrs to make first visit. Romualdo Bolk, of [facility], aware of referral and will obtain pt info from chart.   Sherald Barge, RN 12/08/2016, 2:18 PM

## 2016-12-08 NOTE — Progress Notes (Signed)
Subjective: She continues to improve. She has no new complaints. Her breathing is doing okay. She is now on 3 L oxygen but when she exerts herself she has desaturation even on 5 L. She's not coughing.  Objective: Vital signs in last 24 hours: Temp:  [97.7 F (36.5 C)-98.6 F (37 C)] 97.7 F (36.5 C) (03/27 0500) Pulse Rate:  [63-86] 63 (03/27 0500) Resp:  [18] 18 (03/27 0500) BP: (130-137)/(40-58) 130/40 (03/27 0500) SpO2:  [83 %-96 %] 96 % (03/27 0724) Weight:  [120.5 kg (265 lb 11.2 oz)-121.7 kg (268 lb 3.2 oz)] 121.7 kg (268 lb 3.2 oz) (03/27 0500) Weight change:  Last BM Date: 12/07/16  Intake/Output from previous day: 03/26 0701 - 03/27 0700 In: -  Out: 500 [Urine:500]  PHYSICAL EXAM General appearance: alert, cooperative, no distress and morbidly obese Resp: rhonchi bilaterally Cardio: regular rate and rhythm, S1, S2 normal, no murmur, click, rub or gallop GI: soft, non-tender; bowel sounds normal; no masses,  no organomegaly Extremities: venous stasis dermatitis noted Skin warm and dry. His membranes are moist  Lab Results:  Results for orders placed or performed during the hospital encounter of 12/01/16 (from the past 48 hour(s))  Basic metabolic panel     Status: Abnormal   Collection Time: 12/07/16  4:40 AM  Result Value Ref Range   Sodium 134 (L) 135 - 145 mmol/L   Potassium 4.4 3.5 - 5.1 mmol/L   Chloride 95 (L) 101 - 111 mmol/L   CO2 28 22 - 32 mmol/L   Glucose, Bld 193 (H) 65 - 99 mg/dL   BUN 62 (H) 6 - 20 mg/dL   Creatinine, Ser 1.32 (H) 0.44 - 1.00 mg/dL   Calcium 9.2 8.9 - 10.3 mg/dL   GFR calc non Af Amer 35 (L) >60 mL/min   GFR calc Af Amer 40 (L) >60 mL/min    Comment: (NOTE) The eGFR has been calculated using the CKD EPI equation. This calculation has not been validated in all clinical situations. eGFR's persistently <60 mL/min signify possible Chronic Kidney Disease.    Anion gap 11 5 - 15  Basic metabolic panel     Status: Abnormal    Collection Time: 12/08/16  5:04 AM  Result Value Ref Range   Sodium 136 135 - 145 mmol/L   Potassium 5.0 3.5 - 5.1 mmol/L   Chloride 99 (L) 101 - 111 mmol/L   CO2 27 22 - 32 mmol/L   Glucose, Bld 165 (H) 65 - 99 mg/dL   BUN 54 (H) 6 - 20 mg/dL   Creatinine, Ser 1.17 (H) 0.44 - 1.00 mg/dL   Calcium 9.2 8.9 - 10.3 mg/dL   GFR calc non Af Amer 40 (L) >60 mL/min   GFR calc Af Amer 46 (L) >60 mL/min    Comment: (NOTE) The eGFR has been calculated using the CKD EPI equation. This calculation has not been validated in all clinical situations. eGFR's persistently <60 mL/min signify possible Chronic Kidney Disease.    Anion gap 10 5 - 15    ABGS  Recent Labs  12/06/16 0520  PHART 7.391  PO2ART 60.7*  HCO3 28.8*   CULTURES Recent Results (from the past 240 hour(s))  Culture, blood (Routine X 2) w Reflex to ID Panel     Status: None   Collection Time: 12/01/16  3:01 AM  Result Value Ref Range Status   Specimen Description BLOOD LEFT HAND  Final   Special Requests BOTTLES DRAWN AEROBIC ONLY 2CC ONLY  Final   Culture NO GROWTH 5 DAYS  Final   Report Status 12/06/2016 FINAL  Final  Culture, blood (Routine X 2) w Reflex to ID Panel     Status: None   Collection Time: 12/01/16  3:08 AM  Result Value Ref Range Status   Specimen Description BLOOD RIGHT HAND  Final   Special Requests BOTTLES DRAWN AEROBIC ONLY 2CC ONLY  Final   Culture NO GROWTH 5 DAYS  Final   Report Status 12/06/2016 FINAL  Final  MRSA PCR Screening     Status: None   Collection Time: 12/02/16  6:30 AM  Result Value Ref Range Status   MRSA by PCR NEGATIVE NEGATIVE Final    Comment:        The GeneXpert MRSA Assay (FDA approved for NASAL specimens only), is one component of a comprehensive MRSA colonization surveillance program. It is not intended to diagnose MRSA infection nor to guide or monitor treatment for MRSA infections.    Studies/Results: No results found.  Medications:  Prior to Admission:   Prescriptions Prior to Admission  Medication Sig Dispense Refill Last Dose  . acetaminophen (TYLENOL) 500 MG tablet Take 500-1,000 mg by mouth every 6 (six) hours as needed for headache.   Past Week at Unknown time  . clopidogrel (PLAVIX) 75 MG tablet Take 75 mg by mouth daily.   11/30/2016  . dextromethorphan-guaiFENesin (MUCINEX DM) 30-600 MG 12hr tablet Take 1 tablet by mouth 2 (two) times daily as needed for cough.   11/30/2016 at Unknown time  . diphenhydrAMINE (BENADRYL) 25 mg capsule Take 25 mg by mouth every 6 (six) hours as needed for allergies.   Past Week at Unknown time  . gemfibrozil (LOPID) 600 MG tablet Take 600 mg by mouth 2 (two) times daily before a meal.   11/30/2016 at Unknown time  . losartan-hydrochlorothiazide (HYZAAR) 100-25 MG tablet Take 1 tablet by mouth daily.   11/30/2016  . Pseudoephedrine-APAP-DM (DAYQUIL PO) Take 15 mLs by mouth daily as needed (cold).   11/30/2016 at Unknown time  . sodium chloride (OCEAN) 0.65 % SOLN nasal spray Place 1 spray into both nostrils as needed for congestion.   unknown  . azithromycin (ZITHROMAX) 500 MG tablet Take 1 tablet by mouth daily.  0 Completed Course at Unknown time   Scheduled: . amLODipine  5 mg Oral Daily  . azithromycin  500 mg Oral Daily  . cefTRIAXone (ROCEPHIN)  IV  1 g Intravenous Q24H  . clopidogrel  75 mg Oral Daily  . docusate sodium  100 mg Oral BID  . furosemide  40 mg Oral Daily  . gemfibrozil  600 mg Oral BID AC  . guaiFENesin  1,200 mg Oral BID  . heparin subcutaneous  5,000 Units Subcutaneous Q8H  . ipratropium-albuterol  3 mL Nebulization BID  . methylPREDNISolone (SOLU-MEDROL) injection  40 mg Intravenous Q12H  . trimethoprim-polymyxin b  2 drop Both Eyes Q4H   Continuous:  [id number], albuterol, ondansetron (ZOFRAN) IV  Assesment: She was admitted with community-acquired pneumonia. She developed acute hypoxic and hypercapnic respiratory failure. She used BiPAP but really didn't tolerate that  very well. She probably does have some element of obesity hypoventilation/sleep apnea but she says she will not use a positive pressure device so I don't think there's any point in having her do a sleep study. She is improving. She is still requiring relatively high flow oxygen especially with exertion Principal Problem:   Community acquired pneumonia Active Problems:   Hypertension  High cholesterol   Acute respiratory failure with hypoxia (HCC)   Acute on chronic alteration in mental status   Flash pulmonary edema (HCC)   Chronic constipation   Acute respiratory failure with hypoxia and hypercarbia (HCC)   Acute metabolic encephalopathy   Acute diastolic CHF (congestive heart failure) ([place])    Plan: Continue current treatments. I think is probably okay to switch to oral steroids and potentially to oral antibiotics I took the liberty of making those changes   LOS: 7 days   Treasa Bradshaw L 12/08/2016, 8:16 AM

## 2016-12-08 NOTE — Progress Notes (Signed)
PROGRESS NOTE    Brooke Lane  EHU:[id number] DOB: [birth year] DOA: 12/01/2016 PCP: Karsten Ro, DO    Brief Narrative:  83 year old female who was brought to the hospital with a four-day history of progressive cough and shortness of breath. Found to be hypoxic on room air. Referred for further treatment of pneumonia. Became acutely lethargic and confused and was noted to be in respiratory acidosis. She was sent to the stepdown unit and started on BiPAP therapy. Chest x-ray showed pneumonia versus CHF. She was treated with intravenous antibiotics and IV Lasix with gradual improvement. Pulmonology has been following. She has had significant diuresis and has since been transitioned to oral Lasix. Respiratory acidosis has improved, although she had poor compliance with positive pressure. She was unable to tolerate BiPAP or CPAP and will likely not be using it at home. She's been transitioned to oral steroids and antibiotics as well as oral diuretics. We'll need to monitor another 24 hours to ensure stability. Anticipate discharge home in the next 24 hours if she continues to improve..   Assessment & Plan:   Principal Problem:   Community acquired pneumonia Active Problems:   Hypertension   High cholesterol   Acute respiratory failure with hypoxia (HCC)   Acute on chronic alteration in mental status   Flash pulmonary edema (HCC)   Chronic constipation   Acute respiratory failure with hypoxia and hypercarbia (HCC)   Acute metabolic encephalopathy   Acute diastolic CHF (congestive heart failure) (HCC)   Acute respiratory failure with hypoxia and hypercarbia. Possibly precipitated by pneumonia/CHF. She had a markedly elevated PCO2 and low pH on ABG. She was started on BiPAP therapy with improvement of her PCO2. Overall mental status is improving. PCO2 has trended down significantly. Pulmonology following. Evaluated by speech therapy and not felt to have any aspiration. She is still  requiring high flow oxygen with minimal exertion. Will continue to wean oxygen as tolerated. Will continue to monitor.  Metabolic encephalopathy related to respiratory acidosis. Initially improved after BiPAP therapy. Now back to baseline. Continue to monitor.  Community-acquired pneumonia. Continues to have productive cough and required supplemental oxygen. Continue intravenous antibiotics and pulmonary hygiene. On steroids per pulmonology  Acute diastolic congestive heart failure. Chest x-ray indicated possible CHF and she does have some peripheral edema. Was previously on Lasix as an outpatient, but this was changed to hydrochlorothiazide approximately one year ago. She is currently on IV lasix with good urine output. Volume status is -9.9L. Creatinine was trending up, so Iv lasix was discontinued. Now on oral lasix. Echocardiogram shows normal EF with diastolic dysfunction.  Deconditioning. Seen by physical therapy and recommended nursing facility placement. Patient has refused.   DVT prophylaxis: Heparin Code Status: DNR Family Communication: Discussed with daughters at the bedside Disposition Plan: Discharge home when improved, patient does not wish to go to a nursing home.   Consultants:   pulmonology  Procedures:  Echo- Mild LVH with LVEF 60-65% and grade 1 diastolic dysfunction.   Calcified mitral annulus and aortic annulus. Trivial tricuspid    regurgitation. Unable to assess PASP.  Antimicrobials:   Azithromycin 3/20>>  Ceftriaxone 3/20>>3/27  ceftin 3/27>>   Subjective: Has mild productive cough. Overall feeling better.  Objective: Vitals:   12/08/16 0500 12/08/16 0724 12/08/16 1240 12/08/16 1600  BP: (!) 130/40   (!) 137/57  Pulse: 63   86  Resp: 18   16  Temp: 97.7 F (36.5 C)   97.7 F (36.5 C)  TempSrc: Oral  Oral  SpO2: 93% 96% 96% 92%  Weight: 121.7 kg (268 lb 3.2 oz)     Height:        Intake/Output Summary (Last 24 hours) at 12/08/16  1629 Last data filed at 12/07/16 1700  Gross per 24 hour  Intake                0 ml  Output              300 ml  Net             -300 ml   Filed Weights   12/06/16 0500 12/07/16 0923 12/08/16 0500  Weight: 122.3 kg (269 lb 10 oz) 120.5 kg (265 lb 11.2 oz) 121.7 kg (268 lb 3.2 oz)    Examination:  General exam: Appears calm and comfortable  Respiratory system: coarse crackles at bases. Respiratory effort normal. Cardiovascular system: S1 & S2 heard, RRR. No JVD, murmurs, rubs, gallops or clicks. Trace pedal edema. Gastrointestinal system: Abdomen is nondistended, soft and nontender. No organomegaly or masses felt. Normal bowel sounds heard. Central nervous system: Alert and oriented. No focal neurological deficits. Extremities: Symmetric 5 x 5 power. Skin: No rashes, lesions or ulcers Psychiatry: Judgement and insight appear normal. Mood & affect appropriate.     Data Reviewed: I have personally reviewed following labs and imaging studies  CBC:  Recent Labs Lab 12/02/16 0436 12/03/16 0455 12/04/16 0426  WBC 8.1 8.9 8.4  HGB 11.6* 11.7* 12.3  HCT 38.2 36.7 39.4  MCV 94.8 93.6 94.0  PLT 287 291 932   Basic Metabolic Panel:  Recent Labs Lab 12/04/16 0426 12/05/16 0959 12/06/16 0459 12/07/16 0440 12/08/16 0504  NA 135 137 132* 134* 136  K 4.3 4.4 4.6 4.4 5.0  CL 89* 92* 91* 95* 99*  CO2 35* 32 29 28 27   GLUCOSE 177* 204* 194* 193* 165*  BUN 31* 44* 59* 62* 54*  CREATININE 1.19* 1.40* 1.57* 1.32* 1.17*  CALCIUM 8.9 9.8 9.5 9.2 9.2   GFR: Estimated Creatinine Clearance: 44 mL/min (A) (by C-G formula based on SCr of 1.17 mg/dL (H)). Liver Function Tests: No results for input(s): AST, ALT, ALKPHOS, BILITOT, PROT, ALBUMIN in the last 168 hours. No results for input(s): LIPASE, AMYLASE in the last 168 hours. No results for input(s): AMMONIA in the last 168 hours. Coagulation Profile: No results for input(s): INR, PROTIME in the last 168 hours. Cardiac  Enzymes: No results for input(s): CKTOTAL, CKMB, CKMBINDEX, TROPONINI in the last 168 hours. BNP (last 3 results) No results for input(s): PROBNP in the last 8760 hours. HbA1C: No results for input(s): HGBA1C in the last 72 hours. CBG: No results for input(s): GLUCAP in the last 168 hours. Lipid Profile: No results for input(s): CHOL, HDL, LDLCALC, TRIG, CHOLHDL, LDLDIRECT in the last 72 hours. Thyroid Function Tests: No results for input(s): TSH, T4TOTAL, FREET4, T3FREE, THYROIDAB in the last 72 hours. Anemia Panel: No results for input(s): VITAMINB12, FOLATE, FERRITIN, TIBC, IRON, RETICCTPCT in the last 72 hours. Sepsis Labs: No results for input(s): PROCALCITON, LATICACIDVEN in the last 168 hours.  Recent Results (from the past 240 hour(s))  Culture, blood (Routine X 2) w Reflex to ID Panel     Status: None   Collection Time: 12/01/16  3:01 AM  Result Value Ref Range Status   Specimen Description BLOOD LEFT HAND  Final   Special Requests BOTTLES DRAWN AEROBIC ONLY 2CC ONLY  Final   Culture NO GROWTH 5 DAYS  Final   Report Status 12/06/2016 FINAL  Final  Culture, blood (Routine X 2) w Reflex to ID Panel     Status: None   Collection Time: 12/01/16  3:08 AM  Result Value Ref Range Status   Specimen Description BLOOD RIGHT HAND  Final   Special Requests BOTTLES DRAWN AEROBIC ONLY 2CC ONLY  Final   Culture NO GROWTH 5 DAYS  Final   Report Status 12/06/2016 FINAL  Final  MRSA PCR Screening     Status: None   Collection Time: 12/02/16  6:30 AM  Result Value Ref Range Status   MRSA by PCR NEGATIVE NEGATIVE Final    Comment:        The GeneXpert MRSA Assay (FDA approved for NASAL specimens only), is one component of a comprehensive MRSA colonization surveillance program. It is not intended to diagnose MRSA infection nor to guide or monitor treatment for MRSA infections.          Radiology Studies: No results found.      Scheduled Meds: . amLODipine  5 mg Oral  Daily  . azithromycin  500 mg Oral Daily  . cefUROXime  500 mg Oral BID WC  . clopidogrel  75 mg Oral Daily  . docusate sodium  100 mg Oral BID  . furosemide  40 mg Oral Daily  . gemfibrozil  600 mg Oral BID AC  . guaiFENesin  1,200 mg Oral BID  . heparin subcutaneous  5,000 Units Subcutaneous Q8H  . ipratropium-albuterol  3 mL Nebulization BID  . predniSONE  40 mg Oral Q breakfast  . trimethoprim-polymyxin b  2 drop Both Eyes Q4H   Continuous Infusions:   LOS: 7 days    Time spent: 53mins    MEMON,JEHANZEB, MD [facility] Pager [phone]  If 7PM-7AM, please contact night-coverage [email or web address] Password [place] 12/08/2016, 4:29 PM

## 2016-12-09 DIAGNOSIS — G9341 Metabolic encephalopathy: Secondary | ICD-10-CM

## 2016-12-09 DIAGNOSIS — I5031 Acute diastolic (congestive) heart failure: Secondary | ICD-10-CM

## 2016-12-09 LAB — BASIC METABOLIC PANEL
ANION GAP: 7 (ref 5–15)
BUN: 43 mg/dL — AB (ref 6–20)
CALCIUM: 9.1 mg/dL (ref 8.9–10.3)
CO2: 30 mmol/L (ref 22–32)
CREATININE: 1.26 mg/dL — AB (ref 0.44–1.00)
Chloride: 99 mmol/L — ABNORMAL LOW (ref 101–111)
GFR calc non Af Amer: 37 mL/min — ABNORMAL LOW (ref >60)
GFR, EST AFRICAN AMERICAN: 42 mL/min — AB (ref >60)
GLUCOSE: 136 mg/dL — AB (ref 65–99)
Potassium: 4 mmol/L (ref 3.5–5.1)
SODIUM: 136 mmol/L (ref 135–145)

## 2016-12-09 MED ORDER — DOCUSATE SODIUM 100 MG PO CAPS: 100.0000 mg | ORAL_CAPSULE | Freq: Two times a day (BID) | ORAL | 0 refills | Status: DC

## 2016-12-09 MED ORDER — GUAIFENESIN ER 600 MG PO TB12: 1200.0000 mg | ORAL_TABLET | Freq: Two times a day (BID) | ORAL | 0 refills | Status: DC

## 2016-12-09 MED ORDER — FUROSEMIDE 40 MG PO TABS
40.0000 mg | ORAL_TABLET | Freq: Every day | ORAL | 0 refills | Status: AC
Start: 2016-12-10 — End: ?

## 2016-12-09 MED ORDER — ALBUTEROL SULFATE (2.5 MG/3ML) 0.083% IN NEBU: 2.5000 mg | INHALATION_SOLUTION | Freq: Four times a day (QID) | RESPIRATORY_TRACT | 1 refills | Status: DC | PRN

## 2016-12-09 MED ORDER — AMLODIPINE BESYLATE 5 MG PO TABS: 5.0000 mg | ORAL_TABLET | Freq: Every day | ORAL | 0 refills | Status: AC

## 2016-12-09 MED ORDER — PREDNISONE 20 MG PO TABS: ORAL_TABLET | ORAL | 0 refills | Status: DC

## 2016-12-09 MED ORDER — IPRATROPIUM-ALBUTEROL 0.5-2.5 (3) MG/3ML IN SOLN: 3.0000 mL | Freq: Four times a day (QID) | RESPIRATORY_TRACT | 1 refills | Status: DC | PRN

## 2016-12-09 NOTE — Discharge Summary (Signed)
Physician Discharge Summary  Brooke Lane ZOX:[id number] DOB: 1925-02-24 DOA: 12/01/2016  PCP: Rory Percy D, DO  Admit date: 12/01/2016 Discharge date: 12/09/2016  Admitted From: Home.  Disposition:  Home  Recommendations for Outpatient Follow-up:  1. Follow up with PCP in 1-2 weeks 2. Please obtain BMP/CBC in one week   Home Health:yes  Discharge Condition: stable.  CODE STATUS: DNR Diet recommendation: Heart Healthy  Brief/Interim Summary: 83 year old female who was brought to the hospital with a four-day history of progressive cough and shortness of breath. Found to be hypoxic on room air. Referred for further treatment of pneumonia. Became acutely lethargic and confused and was noted to be in respiratory acidosis. She was sent to the stepdown unit and started on BiPAP therapy. Chest x-ray showed pneumonia versus CHF. She was treated with intravenous antibiotics and IV Lasix with gradual improvement. Pulmonology has been following. She has had significant diuresis and has since been transitioned to oral Lasix. Respiratory acidosis has improved, although she had poor compliance with positive pressure. She was unable to tolerate BiPAP or CPAP and will likely not be using it at home. She's been transitioned to oral steroids and antibiotics as well as oral diuretics. PULMONARY cleared her for discharge.   Discharge Diagnoses:  Principal Problem:   Community acquired pneumonia Active Problems:   Hypertension   High cholesterol   Acute respiratory failure with hypoxia (HCC)   Acute on chronic alteration in mental status   Flash pulmonary edema (HCC)   Chronic constipation   Acute respiratory failure with hypoxia and hypercarbia (HCC)   Acute metabolic encephalopathy   Acute diastolic CHF (congestive heart failure) (HCC)  Acute respiratory failure with hypoxia and hypercarbia. Possibly precipitated by pneumonia/CHF. She had a markedly elevated PCO2 and low pH on ABG. She was  started on BiPAP therapy with improvement of her PCO2. Overall mental status improved.  PCO2 has trended down significantly. Pulmonology consulted and recommendations given. Evaluated by speech therapy and not felt to have any aspiration. We were able to wean her off the oxygen as tolerated. Recommend outpatient follow up with pulmonary as needed.   Metabolic encephalopathy related to respiratory acidosis. Initially improved after BiPAP therapy. Now back to baseline. Continue to monitor.  Community-acquired pneumonia. Continues to have productive cough and required supplemental oxygen. Completed 8 days of antibiotics.   Acute diastolic congestive heart failure. Chest x-ray indicated possible CHF and she does have some peripheral edema. Was previously on Lasix as an outpatient, but this was changed to hydrochlorothiazide approximately one year ago. She is currently on IV lasix with good urine output. Volume status is greater than 10 liters. Creatinine was trending up, so Iv lasix was discontinued. Discharge her on oral lasix. Echocardiogram shows normal EF with diastolic dysfunction.  Deconditioning. Seen by physical therapy and recommended nursing facility placement. Patient has refused.   Discharge Instructions  Discharge Instructions    Diet - low sodium heart healthy    Complete by:  As directed    Discharge instructions    Complete by:  As directed    Please follow up with PCP in one week.     Allergies as of 12/09/2016   No Known Allergies     Medication List    STOP taking these medications   azithromycin 500 MG tablet Commonly known as:  ZITHROMAX   losartan-hydrochlorothiazide 100-25 MG tablet Commonly known as:  HYZAAR     TAKE these medications   acetaminophen 500 MG tablet Commonly known  as:  TYLENOL Take 500-1,000 mg by mouth every 6 (six) hours as needed for headache.   albuterol (2.5 MG/3ML) 0.083% nebulizer solution Commonly known as:  PROVENTIL Inhale 3  mLs (2.5 mg total) into the lungs every 6 (six) hours as needed for wheezing or shortness of breath.   amLODipine 5 MG tablet Commonly known as:  NORVASC Take 1 tablet (5 mg total) by mouth daily. Start taking on:  12/10/2016   clopidogrel 75 MG tablet Commonly known as:  PLAVIX Take 75 mg by mouth daily.   DAYQUIL PO Take 15 mLs by mouth daily as needed (cold).   dextromethorphan-guaiFENesin 30-600 MG 12hr tablet Commonly known as:  MUCINEX DM Take 1 tablet by mouth 2 (two) times daily as needed for cough.   diphenhydrAMINE 25 mg capsule Commonly known as:  BENADRYL Take 25 mg by mouth every 6 (six) hours as needed for allergies.   docusate sodium 100 MG capsule Commonly known as:  COLACE Take 1 capsule (100 mg total) by mouth 2 (two) times daily.   furosemide 40 MG tablet Commonly known as:  LASIX Take 1 tablet (40 mg total) by mouth daily. Start taking on:  12/10/2016   gemfibrozil 600 MG tablet Commonly known as:  LOPID Take 600 mg by mouth 2 (two) times daily before a meal.   guaiFENesin 600 MG 12 hr tablet Commonly known as:  MUCINEX Take 2 tablets (1,200 mg total) by mouth 2 (two) times daily.   ipratropium-albuterol 0.5-2.5 (3) MG/3ML Soln Commonly known as:  DUONEB Take 3 mLs by nebulization every 6 (six) hours as needed.   predniSONE 20 MG tablet Commonly known as:  DELTASONE Prednisone 40 mg daily for 1 day followed by  Prednisone 30 mg daily for 2 days followed by  Prednisone 20 mg daily for 2 days. Start taking on:  12/10/2016   sodium chloride 0.65 % Soln nasal spray Commonly known as:  OCEAN Place 1 spray into both nostrils as needed for congestion.      Follow-up Information    HOWARD, KEVIN D, DO. Schedule an appointment as soon as possible for a visit in 1 week(s).   Specialty:  Family Medicine Why:  please follow up with bmp in one week.  Contact information: [place] [place] [id number] [phone]          No  Known Allergies  Consultations:  Pulmonary    Procedures/Studies: Dg Chest 2 View  Result Date: 12/01/2016 CLINICAL DATA:  83 year old female with productive cough and shortness of breath. EXAM: CHEST  2 VIEW COMPARISON:  None. FINDINGS: There is emphysematous changes of the lungs. Patchy areas of airspace density in the right upper lobe along the minor fissure as well as in the right middle lobe are concerning for pneumonia. Small left upper lobe hazy airspace density is also noted. There is no significant pleural effusion. No pneumothorax. Top-normal cardiac silhouette. No acute osseous pathology. IMPRESSION: Multiple area of airspace density predominantly involving the right lung likely multifocal pneumonia. Clinical correlation and follow-up to resolution recommended. Electronically Signed   By: Anner Crete M.D.   On: 12/01/2016 02:07   Dg Chest Port 1 View  Result Date: 12/02/2016 CLINICAL DATA:  Pneumonia EXAM: PORTABLE CHEST 1 VIEW COMPARISON:  December 01, 2016 FINDINGS: There is persistent airspace consolidation in both lower lobes, more on the right than on the left. There is cardiomegaly with pulmonary venous hypertension. There is slight interstitial edema with small pleural effusions  bilaterally. No evident adenopathy. No bone lesions. IMPRESSION: Findings indicative of a degree of congestive heart failure. Suspect superimposed pneumonia in the bases. Some of the opacity in the lung bases may represent alveolar edema; both pneumonia and alveolar edema may present concurrently. The appearance is similar compared to 1 day prior. Electronically Signed   By: Lowella Grip III M.D.   On: 12/02/2016 07:18   Dg Chest Port 1 View  Result Date: 12/01/2016 CLINICAL DATA:  PNEUMONIA, Reports of productive cough since Wednesday with nasal congestion. Also reports of feeling shortness of breath and nausea. HISTORY OF HTN, HIATAL HERNIA, STROKE EXAM: PORTABLE CHEST 1 VIEW COMPARISON:   12/01/2016 FINDINGS: Grossly unchanged cardiac silhouette and mediastinal contours. Pulmonary vasculature appears less distinct than present examination with cephalization of flow. Slightly nodular airspace opacity within the peripheral aspect the right lower lung is grossly unchanged. Interval development of small left-sided effusion with associated worsening left basilar opacities. A small amount of fluid is suspected within the right minor fissure. No pneumothorax. No acute osseus abnormalities. IMPRESSION: 1. Findings worrisome for pulmonary edema with development of a small left-sided effusion and worsening left basilar opacities, atelectasis versus infiltrate. 2. Grossly unchanged ill-defined heterogeneous airspace opacity with the peripheral aspect the right lower lung worrisome for infection. Continued attention on follow-up is recommended. Electronically Signed   By: Sandi Mariscal M.D.   On: 12/01/2016 17:14    Echocardiogram.    Subjective: No new complaints  Discharge Exam: Vitals:   12/08/16 2252 12/09/16 0643  BP: (!) 126/56 (!) 125/42  Pulse: 90 65  Resp: 20 15  Temp: 98.5 F (36.9 C) 98.2 F (36.8 C)   Vitals:   12/08/16 2252 12/09/16 0643 12/09/16 0714 12/09/16 0759  BP: (!) 126/56 (!) 125/42    Pulse: 90 65    Resp: 20 15    Temp: 98.5 F (36.9 C) 98.2 F (36.8 C)    TempSrc: Oral Oral    SpO2: 93% 95%  96%  Weight:   122.1 kg (269 lb 3.2 oz)   Height:        General: Pt is alert, awake, not in acute distress Cardiovascular: RRR, S1/S2 +, no rubs, no gallops Respiratory: CTA bilaterally, no wheezing, no rhonchi Abdominal: Soft, NT, ND, bowel sounds + Extremities: no edema, no cyanosis    The results of significant diagnostics from this hospitalization (including imaging, microbiology, ancillary and laboratory) are listed below for reference.     Microbiology: Recent Results (from the past 240 hour(s))  Culture, blood (Routine X 2) w Reflex to ID Panel      Status: None   Collection Time: 12/01/16  3:01 AM  Result Value Ref Range Status   Specimen Description BLOOD LEFT HAND  Final   Special Requests BOTTLES DRAWN AEROBIC ONLY 2CC ONLY  Final   Culture NO GROWTH 5 DAYS  Final   Report Status 12/06/2016 FINAL  Final  Culture, blood (Routine X 2) w Reflex to ID Panel     Status: None   Collection Time: 12/01/16  3:08 AM  Result Value Ref Range Status   Specimen Description BLOOD RIGHT HAND  Final   Special Requests BOTTLES DRAWN AEROBIC ONLY 2CC ONLY  Final   Culture NO GROWTH 5 DAYS  Final   Report Status 12/06/2016 FINAL  Final  MRSA PCR Screening     Status: None   Collection Time: 12/02/16  6:30 AM  Result Value Ref Range Status   MRSA by PCR  NEGATIVE NEGATIVE Final    Comment:        The GeneXpert MRSA Assay (FDA approved for NASAL specimens only), is one component of a comprehensive MRSA colonization surveillance program. It is not intended to diagnose MRSA infection nor to guide or monitor treatment for MRSA infections.      Labs: BNP (last 3 results)  Recent Labs  12/01/16 0138  BNP 58.5   Basic Metabolic Panel:  Recent Labs Lab 12/05/16 0959 12/06/16 0459 12/07/16 0440 12/08/16 0504 12/09/16 0436  NA 137 132* 134* 136 136  K 4.4 4.6 4.4 5.0 4.0  CL 92* 91* 95* 99* 99*  CO2 32 29 28 27 30   GLUCOSE 204* 194* 193* 165* 136*  BUN 44* 59* 62* 54* 43*  CREATININE 1.40* 1.57* 1.32* 1.17* 1.26*  CALCIUM 9.8 9.5 9.2 9.2 9.1   Liver Function Tests: No results for input(s): AST, ALT, ALKPHOS, BILITOT, PROT, ALBUMIN in the last 168 hours. No results for input(s): LIPASE, AMYLASE in the last 168 hours. No results for input(s): AMMONIA in the last 168 hours. CBC:  Recent Labs Lab 12/03/16 0455 12/04/16 0426  WBC 8.9 8.4  HGB 11.7* 12.3  HCT 36.7 39.4  MCV 93.6 94.0  PLT 291 291   Cardiac Enzymes: No results for input(s): CKTOTAL, CKMB, CKMBINDEX, TROPONINI in the last 168 hours. BNP: Invalid input(s):  POCBNP CBG: No results for input(s): GLUCAP in the last 168 hours. D-Dimer No results for input(s): DDIMER in the last 72 hours. Hgb A1c No results for input(s): HGBA1C in the last 72 hours. Lipid Profile No results for input(s): CHOL, HDL, LDLCALC, TRIG, CHOLHDL, LDLDIRECT in the last 72 hours. Thyroid function studies No results for input(s): TSH, T4TOTAL, T3FREE, THYROIDAB in the last 72 hours.  Invalid input(s): FREET3 Anemia work up No results for input(s): VITAMINB12, FOLATE, FERRITIN, TIBC, IRON, RETICCTPCT in the last 72 hours. Urinalysis No results found for: COLORURINE, APPEARANCEUR, [place], [place], [place], [place], [place], [place], PROTEINUR, UROBILINOGEN, NITRITE, LEUKOCYTESUR Sepsis Labs Invalid input(s): PROCALCITONIN,  WBC,  LACTICIDVEN Microbiology Recent Results (from the past 240 hour(s))  Culture, blood (Routine X 2) w Reflex to ID Panel     Status: None   Collection Time: 12/01/16  3:01 AM  Result Value Ref Range Status   Specimen Description BLOOD LEFT HAND  Final   Special Requests BOTTLES DRAWN AEROBIC ONLY 2CC ONLY  Final   Culture NO GROWTH 5 DAYS  Final   Report Status 12/06/2016 FINAL  Final  Culture, blood (Routine X 2) w Reflex to ID Panel     Status: None   Collection Time: 12/01/16  3:08 AM  Result Value Ref Range Status   Specimen Description BLOOD RIGHT HAND  Final   Special Requests BOTTLES DRAWN AEROBIC ONLY 2CC ONLY  Final   Culture NO GROWTH 5 DAYS  Final   Report Status 12/06/2016 FINAL  Final  MRSA PCR Screening     Status: None   Collection Time: 12/02/16  6:30 AM  Result Value Ref Range Status   MRSA by PCR NEGATIVE NEGATIVE Final    Comment:        The GeneXpert MRSA Assay (FDA approved for NASAL specimens only), is one component of a comprehensive MRSA colonization surveillance program. It is not intended to diagnose MRSA infection nor to guide or monitor treatment for MRSA infections.      Time coordinating  discharge: Over 30 minutes  SIGNED:   Hosie Poisson, MD  [facility] 12/09/2016,  7:28 PM Pager   If 7PM-7AM, please contact night-coverage [email or web address] Password [email or web address]

## 2016-12-09 NOTE — Progress Notes (Signed)
Subjective: She says she feels well. She is still weak but she's not having much shortness of breath. She is still requiring high flow nasal cannula but she has been able to taper down. She is hopeful of going home today. I discussed her situation with Dr. Roderic Palau yesterday and I think she is okay to go home from a pulmonary point of view.  Objective: Vital signs in last 24 hours: Temp:  [97.7 F (36.5 C)-98.5 F (36.9 C)] 98.2 F (36.8 C) (03/28 0643) Pulse Rate:  [65-90] 65 (03/28 0643) Resp:  [15-20] 15 (03/28 0643) BP: (125-137)/(42-57) 125/42 (03/28 0643) SpO2:  [92 %-96 %] 96 % (03/28 0759) Weight:  [122.1 kg (269 lb 3.2 oz)] 122.1 kg (269 lb 3.2 oz) (03/28 0714) Weight change:  Last BM Date: 12/08/16  Intake/Output from previous day: No intake/output data recorded.  PHYSICAL EXAM General appearance: alert, cooperative, no distress and morbidly obese Resp: clear to auscultation bilaterally Cardio: regular rate and rhythm, S1, S2 normal, no murmur, click, rub or gallop GI: soft, non-tender; bowel sounds normal; no masses,  no organomegaly Extremities: venous stasis dermatitis noted Skin warm and dry. Mucous membranes are moist  Lab Results:  Results for orders placed or performed during the hospital encounter of 12/01/16 (from the past 48 hour(s))  Basic metabolic panel     Status: Abnormal   Collection Time: 12/08/16  5:04 AM  Result Value Ref Range   Sodium 136 135 - 145 mmol/L   Potassium 5.0 3.5 - 5.1 mmol/L   Chloride 99 (L) 101 - 111 mmol/L   CO2 27 22 - 32 mmol/L   Glucose, Bld 165 (H) 65 - 99 mg/dL   BUN 54 (H) 6 - 20 mg/dL   Creatinine, Ser 1.17 (H) 0.44 - 1.00 mg/dL   Calcium 9.2 8.9 - 10.3 mg/dL   GFR calc non Af Amer 40 (L) >60 mL/min   GFR calc Af Amer 46 (L) >60 mL/min    Comment: (NOTE) The eGFR has been calculated using the CKD EPI equation. This calculation has not been validated in all clinical situations. eGFR's persistently <60 mL/min signify  possible Chronic Kidney Disease.    Anion gap 10 5 - 15  Basic metabolic panel     Status: Abnormal   Collection Time: 12/09/16  4:36 AM  Result Value Ref Range   Sodium 136 135 - 145 mmol/L   Potassium 4.0 3.5 - 5.1 mmol/L    Comment: DELTA CHECK NOTED   Chloride 99 (L) 101 - 111 mmol/L   CO2 30 22 - 32 mmol/L   Glucose, Bld 136 (H) 65 - 99 mg/dL   BUN 43 (H) 6 - 20 mg/dL   Creatinine, Ser 1.26 (H) 0.44 - 1.00 mg/dL   Calcium 9.1 8.9 - 10.3 mg/dL   GFR calc non Af Amer 37 (L) >60 mL/min   GFR calc Af Amer 42 (L) >60 mL/min    Comment: (NOTE) The eGFR has been calculated using the CKD EPI equation. This calculation has not been validated in all clinical situations. eGFR's persistently <60 mL/min signify possible Chronic Kidney Disease.    Anion gap 7 5 - 15    ABGS No results for input(s): PHART, PO2ART, TCO2, HCO3 in the last 72 hours.  Invalid input(s): PCO2 CULTURES Recent Results (from the past 240 hour(s))  Culture, blood (Routine X 2) w Reflex to ID Panel     Status: None   Collection Time: 12/01/16  3:01 AM  Result  Value Ref Range Status   Specimen Description BLOOD LEFT HAND  Final   Special Requests BOTTLES DRAWN AEROBIC ONLY 2CC ONLY  Final   Culture NO GROWTH 5 DAYS  Final   Report Status 12/06/2016 FINAL  Final  Culture, blood (Routine X 2) w Reflex to ID Panel     Status: None   Collection Time: 12/01/16  3:08 AM  Result Value Ref Range Status   Specimen Description BLOOD RIGHT HAND  Final   Special Requests BOTTLES DRAWN AEROBIC ONLY 2CC ONLY  Final   Culture NO GROWTH 5 DAYS  Final   Report Status 12/06/2016 FINAL  Final  MRSA PCR Screening     Status: None   Collection Time: 12/02/16  6:30 AM  Result Value Ref Range Status   MRSA by PCR NEGATIVE NEGATIVE Final    Comment:        The GeneXpert MRSA Assay (FDA approved for NASAL specimens only), is one component of a comprehensive MRSA colonization surveillance program. It is not intended to  diagnose MRSA infection nor to guide or monitor treatment for MRSA infections.    Studies/Results: No results found.  Medications:  Prior to Admission:  Prescriptions Prior to Admission  Medication Sig Dispense Refill Last Dose  . acetaminophen (TYLENOL) 500 MG tablet Take 500-1,000 mg by mouth every 6 (six) hours as needed for headache.   Past Week at Unknown time  . clopidogrel (PLAVIX) 75 MG tablet Take 75 mg by mouth daily.   11/30/2016  . dextromethorphan-guaiFENesin (MUCINEX DM) 30-600 MG 12hr tablet Take 1 tablet by mouth 2 (two) times daily as needed for cough.   11/30/2016 at Unknown time  . diphenhydrAMINE (BENADRYL) 25 mg capsule Take 25 mg by mouth every 6 (six) hours as needed for allergies.   Past Week at Unknown time  . gemfibrozil (LOPID) 600 MG tablet Take 600 mg by mouth 2 (two) times daily before a meal.   11/30/2016 at Unknown time  . losartan-hydrochlorothiazide (HYZAAR) 100-25 MG tablet Take 1 tablet by mouth daily.   11/30/2016  . Pseudoephedrine-APAP-DM (DAYQUIL PO) Take 15 mLs by mouth daily as needed (cold).   11/30/2016 at Unknown time  . sodium chloride (OCEAN) 0.65 % SOLN nasal spray Place 1 spray into both nostrils as needed for congestion.   unknown  . azithromycin (ZITHROMAX) 500 MG tablet Take 1 tablet by mouth daily.  0 Completed Course at Unknown time   Scheduled: . amLODipine  5 mg Oral Daily  . azithromycin  500 mg Oral Daily  . cefUROXime  500 mg Oral BID WC  . clopidogrel  75 mg Oral Daily  . docusate sodium  100 mg Oral BID  . furosemide  40 mg Oral Daily  . gemfibrozil  600 mg Oral BID AC  . guaiFENesin  1,200 mg Oral BID  . heparin subcutaneous  5,000 Units Subcutaneous Q8H  . ipratropium-albuterol  3 mL Nebulization BID  . predniSONE  40 mg Oral Q breakfast  . trimethoprim-polymyxin b  2 drop Both Eyes Q4H   Continuous:  TIR:[id number], albuterol, ondansetron (ZOFRAN) IV  Assesment: She was admitted with community-acquired pneumonia.  She has had acute hypoxic and hypercapnic respiratory failure. I think she has some element of obesity hypoventilation/sleep apnea but she will not agree to wearing any positive pressure device because she doesn't tolerate it very well. I think she has also had acute on chronic diastolic heart failure. In retrospect she has gained 20-25 pounds over the  last several months had previously been on loop diuretics and that was discontinued. I think she has had the respiratory failure multifactorial. She is substantially better. She's been able to walk some. She still becomes hypoxic when she ambulates Principal Problem:   Community acquired pneumonia Active Problems:   Hypertension   High cholesterol   Acute respiratory failure with hypoxia (HCC)   Acute on chronic alteration in mental status   Flash pulmonary edema (HCC)   Chronic constipation   Acute respiratory failure with hypoxia and hypercarbia (HCC)   Acute metabolic encephalopathy   Acute diastolic CHF (congestive heart failure) ([place])    Plan: I will plan to sign off. She is hopeful of going home. She will need home health services oxygen etc. Once she follows up with her primary care physician am glad to see her as an outpatient if needed    LOS: 8 days   Waver Dibiasio L 12/09/2016, 8:11 AM

## 2016-12-09 NOTE — Care Management Note (Addendum)
Case Management Note  Patient Details  Name: Brooke Lane MRN: [id number] Date of Birth: Feb 03, 1925  Expected Discharge Date:    12/09/2016              Expected Discharge Plan:  Home/Self Care  In-House Referral:  Clinical Social Work  Discharge planning Services  CM Consult  Post Acute Care Choice:  NA Choice offered to:  NA  Status of Service:  Completed, signed off  Additional Comments: Pt discharging home today with self care. She is going to stay at her daughters house in [place]. Daughters at the bedside. She does not meet criteria for home oxgyen. She has been up and ambulating in room, daughters have list of exercises to do with pt. Pt is no longer interested in [facility]. [facility] rep made aware. Referral made for [facility] Transition calls. Sherald Barge, RN 12/09/2016, 12:23 PM

## 2016-12-09 NOTE — Progress Notes (Signed)
Patient states understanding of discharge instructions.  

## 2016-12-09 NOTE — Care Management Important Message (Signed)
Important Message  Patient Details  Name: Brooke Lane MRN: [id number] Date of Birth: Oct 16, 1924   Medicare Important Message Given:  Yes    Sherald Barge, RN 12/09/2016, 12:21 PM

## 2016-12-16 DIAGNOSIS — J189 Pneumonia, unspecified organism: Secondary | ICD-10-CM | POA: Diagnosis not present

## 2016-12-29 DIAGNOSIS — Z1389 Encounter for screening for other disorder: Secondary | ICD-10-CM | POA: Diagnosis not present

## 2016-12-29 DIAGNOSIS — I5189 Other ill-defined heart diseases: Secondary | ICD-10-CM | POA: Diagnosis not present

## 2016-12-29 DIAGNOSIS — I10 Essential (primary) hypertension: Secondary | ICD-10-CM | POA: Diagnosis not present

## 2016-12-29 DIAGNOSIS — E784 Other hyperlipidemia: Secondary | ICD-10-CM | POA: Diagnosis not present

## 2016-12-29 DIAGNOSIS — Z6841 Body Mass Index (BMI) 40.0 and over, adult: Secondary | ICD-10-CM | POA: Diagnosis not present

## 2016-12-29 DIAGNOSIS — J188 Other pneumonia, unspecified organism: Secondary | ICD-10-CM | POA: Diagnosis not present

## 2016-12-29 DIAGNOSIS — J90 Pleural effusion, not elsewhere classified: Secondary | ICD-10-CM | POA: Diagnosis not present

## 2016-12-29 DIAGNOSIS — G458 Other transient cerebral ischemic attacks and related syndromes: Secondary | ICD-10-CM | POA: Diagnosis not present

## 2017-01-11 DIAGNOSIS — R7309 Other abnormal glucose: Secondary | ICD-10-CM | POA: Diagnosis not present

## 2017-01-11 DIAGNOSIS — E784 Other hyperlipidemia: Secondary | ICD-10-CM | POA: Diagnosis not present

## 2017-01-11 DIAGNOSIS — I10 Essential (primary) hypertension: Secondary | ICD-10-CM | POA: Diagnosis not present

## 2017-01-21 DIAGNOSIS — B372 Candidiasis of skin and nail: Secondary | ICD-10-CM | POA: Diagnosis not present

## 2017-01-21 DIAGNOSIS — Z6841 Body Mass Index (BMI) 40.0 and over, adult: Secondary | ICD-10-CM | POA: Diagnosis not present

## 2017-01-21 DIAGNOSIS — G458 Other transient cerebral ischemic attacks and related syndromes: Secondary | ICD-10-CM | POA: Diagnosis not present

## 2017-01-21 DIAGNOSIS — Z23 Encounter for immunization: Secondary | ICD-10-CM | POA: Diagnosis not present

## 2017-01-21 DIAGNOSIS — E784 Other hyperlipidemia: Secondary | ICD-10-CM | POA: Diagnosis not present

## 2017-01-21 DIAGNOSIS — I10 Essential (primary) hypertension: Secondary | ICD-10-CM | POA: Diagnosis not present

## 2017-01-21 DIAGNOSIS — Z0000 Encounter for general adult medical examination without abnormal findings: Secondary | ICD-10-CM | POA: Diagnosis not present

## 2017-01-21 DIAGNOSIS — R7309 Other abnormal glucose: Secondary | ICD-10-CM | POA: Diagnosis not present

## 2017-01-21 DIAGNOSIS — I5189 Other ill-defined heart diseases: Secondary | ICD-10-CM | POA: Diagnosis not present

## 2017-03-01 DIAGNOSIS — Z85828 Personal history of other malignant neoplasm of skin: Secondary | ICD-10-CM | POA: Diagnosis not present

## 2017-03-01 DIAGNOSIS — D229 Melanocytic nevi, unspecified: Secondary | ICD-10-CM | POA: Diagnosis not present

## 2017-03-01 DIAGNOSIS — D692 Other nonthrombocytopenic purpura: Secondary | ICD-10-CM | POA: Diagnosis not present

## 2017-05-31 DIAGNOSIS — J209 Acute bronchitis, unspecified: Secondary | ICD-10-CM | POA: Diagnosis not present

## 2017-05-31 DIAGNOSIS — Z6841 Body Mass Index (BMI) 40.0 and over, adult: Secondary | ICD-10-CM | POA: Diagnosis not present

## 2017-05-31 DIAGNOSIS — R05 Cough: Secondary | ICD-10-CM | POA: Diagnosis not present

## 2017-06-09 DIAGNOSIS — H40031 Anatomical narrow angle, right eye: Secondary | ICD-10-CM | POA: Diagnosis not present

## 2017-06-09 DIAGNOSIS — H25811 Combined forms of age-related cataract, right eye: Secondary | ICD-10-CM | POA: Diagnosis not present

## 2017-06-09 DIAGNOSIS — H353132 Nonexudative age-related macular degeneration, bilateral, intermediate dry stage: Secondary | ICD-10-CM | POA: Diagnosis not present

## 2017-06-14 DIAGNOSIS — J181 Lobar pneumonia, unspecified organism: Secondary | ICD-10-CM | POA: Diagnosis not present

## 2017-06-14 DIAGNOSIS — Z6841 Body Mass Index (BMI) 40.0 and over, adult: Secondary | ICD-10-CM | POA: Diagnosis not present

## 2017-06-14 DIAGNOSIS — J309 Allergic rhinitis, unspecified: Secondary | ICD-10-CM | POA: Diagnosis not present

## 2017-06-14 DIAGNOSIS — I509 Heart failure, unspecified: Secondary | ICD-10-CM | POA: Diagnosis not present

## 2017-06-14 DIAGNOSIS — R05 Cough: Secondary | ICD-10-CM | POA: Diagnosis not present

## 2017-07-10 DIAGNOSIS — Z23 Encounter for immunization: Secondary | ICD-10-CM | POA: Diagnosis not present

## 2017-07-28 DIAGNOSIS — E7849 Other hyperlipidemia: Secondary | ICD-10-CM | POA: Diagnosis not present

## 2017-07-28 DIAGNOSIS — I509 Heart failure, unspecified: Secondary | ICD-10-CM | POA: Diagnosis not present

## 2017-07-28 DIAGNOSIS — Z6841 Body Mass Index (BMI) 40.0 and over, adult: Secondary | ICD-10-CM | POA: Diagnosis not present

## 2017-07-28 DIAGNOSIS — R7309 Other abnormal glucose: Secondary | ICD-10-CM | POA: Diagnosis not present

## 2017-07-28 DIAGNOSIS — I10 Essential (primary) hypertension: Secondary | ICD-10-CM | POA: Diagnosis not present

## 2017-08-09 DIAGNOSIS — I10 Essential (primary) hypertension: Secondary | ICD-10-CM | POA: Diagnosis not present

## 2017-08-30 ENCOUNTER — Emergency Department (HOSPITAL_COMMUNITY): Payer: Medicare Other

## 2017-08-30 ENCOUNTER — Emergency Department (HOSPITAL_COMMUNITY)
Admission: EM | Admit: 2017-08-30 | Discharge: 2017-08-30 | Disposition: A | Discharge status: Home / Self Care | Payer: Medicare Other | Attending: Emergency Medicine | Admitting: Emergency Medicine

## 2017-08-30 ENCOUNTER — Encounter (HOSPITAL_COMMUNITY): Payer: Self-pay | Admitting: Emergency Medicine

## 2017-08-30 DIAGNOSIS — Y929 Unspecified place or not applicable: Secondary | ICD-10-CM | POA: Insufficient documentation

## 2017-08-30 DIAGNOSIS — Y9301 Activity, walking, marching and hiking: Secondary | ICD-10-CM | POA: Diagnosis not present

## 2017-08-30 DIAGNOSIS — S6992XA Unspecified injury of left wrist, hand and finger(s), initial encounter: Secondary | ICD-10-CM | POA: Diagnosis not present

## 2017-08-30 DIAGNOSIS — S0993XA Unspecified injury of face, initial encounter: Secondary | ICD-10-CM | POA: Diagnosis not present

## 2017-08-30 DIAGNOSIS — W19XXXA Unspecified fall, initial encounter: Secondary | ICD-10-CM

## 2017-08-30 DIAGNOSIS — S199XXA Unspecified injury of neck, initial encounter: Secondary | ICD-10-CM | POA: Diagnosis not present

## 2017-08-30 DIAGNOSIS — Z7901 Long term (current) use of anticoagulants: Secondary | ICD-10-CM | POA: Insufficient documentation

## 2017-08-30 DIAGNOSIS — S0083XA Contusion of other part of head, initial encounter: Secondary | ICD-10-CM

## 2017-08-30 DIAGNOSIS — Y999 Unspecified external cause status: Secondary | ICD-10-CM | POA: Insufficient documentation

## 2017-08-30 DIAGNOSIS — I110 Hypertensive heart disease with heart failure: Secondary | ICD-10-CM | POA: Insufficient documentation

## 2017-08-30 DIAGNOSIS — H5712 Ocular pain, left eye: Secondary | ICD-10-CM | POA: Diagnosis not present

## 2017-08-30 DIAGNOSIS — R220 Localized swelling, mass and lump, head: Secondary | ICD-10-CM | POA: Diagnosis not present

## 2017-08-30 DIAGNOSIS — W101XXA Fall (on)(from) sidewalk curb, initial encounter: Secondary | ICD-10-CM | POA: Insufficient documentation

## 2017-08-30 DIAGNOSIS — S022XXA Fracture of nasal bones, initial encounter for closed fracture: Secondary | ICD-10-CM | POA: Diagnosis not present

## 2017-08-30 DIAGNOSIS — M25532 Pain in left wrist: Secondary | ICD-10-CM | POA: Diagnosis not present

## 2017-08-30 DIAGNOSIS — S0990XA Unspecified injury of head, initial encounter: Secondary | ICD-10-CM

## 2017-08-30 DIAGNOSIS — S8001XA Contusion of right knee, initial encounter: Secondary | ICD-10-CM | POA: Diagnosis not present

## 2017-08-30 DIAGNOSIS — M25562 Pain in left knee: Secondary | ICD-10-CM | POA: Diagnosis not present

## 2017-08-30 DIAGNOSIS — T148XXA Other injury of unspecified body region, initial encounter: Secondary | ICD-10-CM | POA: Diagnosis not present

## 2017-08-30 DIAGNOSIS — R221 Localized swelling, mass and lump, neck: Secondary | ICD-10-CM | POA: Diagnosis not present

## 2017-08-30 DIAGNOSIS — I5030 Unspecified diastolic (congestive) heart failure: Secondary | ICD-10-CM | POA: Diagnosis not present

## 2017-08-30 MED ORDER — ONDANSETRON HCL 4 MG/2ML IJ SOLN
4.0000 mg | Freq: Once | INTRAMUSCULAR | Status: AC
  Administered 2017-08-30: 4 mg via INTRAVENOUS
  Filled 2017-08-30: qty 2

## 2017-08-30 MED ORDER — BACITRACIN ZINC 500 UNIT/GM EX OINT
TOPICAL_OINTMENT | CUTANEOUS | Status: AC
  Administered 2017-08-30: 1
  Filled 2017-08-30: qty 3.6

## 2017-08-30 NOTE — ED Notes (Signed)
Cleaned wounds and applied bacitracin to left hand and bridge of nose. Covered wounds on left hand and forehead with tefla and sterile gauze. Placed bandaid on nose. Patient tolerated well. Discharged via wheelchair with family to drive her home. Patient able to stand and ambulate with assitance. Denies pain or lightheadedness at discharge.

## 2017-08-30 NOTE — ED Provider Notes (Signed)
Emergency Department Provider Note   I have reviewed the triage vital signs and the nursing notes.   HISTORY  Chief Complaint Fall   HPI Carena Stream is a 83 y.o. female with PMH of HTN, HLD, and CVA on Plavix presents to the emergency department for evaluation after mechanical fall with head/face trauma.  Patient was stepping off the curb when she missed her step and fell forward striking her forehead and face on the pavement.  She also struck her left wrist.  She has some bleeding on scene which was controlled with direct pressure.  The patient denies any loss of consciousness.  She is on Plavix.  No preceding chest pain, palpitations, dyspnea.  No vision changes, unilateral weakness/numbness.   Past Medical History:  Diagnosis Date  . Hiatal hernia   . High cholesterol   . Hypertension   . Stroke [facility])     Patient Active Problem List   Diagnosis Date Noted  . Acute metabolic encephalopathy 53/61/4431  . Acute diastolic CHF (congestive heart failure) ([place]) 12/03/2016  . Community acquired pneumonia 12/01/2016  . Acute respiratory failure with hypoxia ([place]) 12/01/2016  . Chronic constipation 12/01/2016  . Acute respiratory failure with hypoxia and hypercarbia ([place]) 12/01/2016  . Hypertension   . High cholesterol   . Acute on chronic alteration in mental status   . Flash pulmonary edema [facility])     Past Surgical History:  Procedure Laterality Date  . BREAST LUMPECTOMY    . TONSILLECTOMY      Current Outpatient Rx  . Order #: [id number] Class: Normal  . Order #: [id number] Class: Historical Med  . Order #: [id number] Class: Historical Med  . Order #: [id number] Class: Historical Med  . Order #: [id number] Class: Normal  . Order #: [id number] Class: Normal  . Order #: [id number] Class: Historical Med  . Order #: [id number] Class: Historical Med    Allergies Patient has no known allergies.  History reviewed. No pertinent family history.  Social History Social History    Tobacco Use  . Smoking status: Never Smoker  . Smokeless tobacco: Never Used  Substance Use Topics  . Alcohol use: No    Alcohol/week: 0.0 oz  . Drug use: No    Review of Systems  Constitutional: No fever/chills Eyes: No visual changes. ENT: No sore throat. Cardiovascular: Denies chest pain. Respiratory: Denies shortness of breath. Gastrointestinal: No abdominal pain.  No nausea, no vomiting.  No diarrhea.  No constipation. Genitourinary: Negative for dysuria. Musculoskeletal: Negative for back pain. Skin: Abrasion over nose and forehead.  Neurological: Negative for headaches, focal weakness or numbness.  10-point ROS otherwise negative.  ____________________________________________   PHYSICAL EXAM:  VITAL SIGNS: ED Triage Vitals  Enc Vitals Group     BP 08/30/17 1414 (!) 158/51     Pulse Rate 08/30/17 1414 96     Resp 08/30/17 1414 20     Temp 08/30/17 1414 97.6 F (36.4 C)     Temp Source 08/30/17 1414 Oral     SpO2 08/30/17 1414 94 %     Weight 08/30/17 1411 280 lb (127 kg)     Height 08/30/17 1411 5\' 7"  (1.702 m)     Pain Score 08/30/17 1411 10   Constitutional: Alert and oriented. Well appearing and in no acute distress. Eyes: Conjunctivae are normal. PERRL. Head: Large forehead hematoma with abrasion. No laceration.  Nose: Abrasion over the nose bridge with some swelling. No septal hematoma.  Mouth/Throat: Mucous membranes are moist.   Neck: No stridor.  No cervical spine tenderness to palpation. Cardiovascular: Normal rate, regular rhythm. Good peripheral circulation. Grossly normal heart sounds.   Respiratory: Normal respiratory effort.  No retractions. Lungs CTAB. Gastrointestinal: Soft and nontender. No distention.  Musculoskeletal: Some bruising over the right knee with full passive and active ROM. Normal ROM of bilateral hips. No gross deformities of extremities. Neurologic:  Normal speech and language. No gross focal neurologic deficits are  appreciated.  Skin:  Skin is warm, dry and intact. Abrasions over the forehead, nose, and left wrist.   ____________________________________________  RADIOLOGY  Dg Wrist Complete Left  Result Date: 08/30/2017 CLINICAL DATA:  Acute left wrist pain following fall. Initial encounter. EXAM: LEFT WRIST - COMPLETE 3+ VIEW COMPARISON:  None. FINDINGS: No acute fracture or dislocation identified. Degenerative changes at the first carpometacarpal joint noted. No suspicious focal bony lesions are present. IMPRESSION: 1. No acute abnormality 2. Degenerative changes at the first carpometacarpal joint. Electronically Signed   By: Margarette Canada M.D.   On: 08/30/2017 15:31   Ct Head Wo Contrast  Result Date: 08/30/2017 CLINICAL DATA:  [address] and hit forehead with swelling and laceration to bridge of nose EXAM: CT HEAD WITHOUT CONTRAST CT MAXILLOFACIAL WITHOUT CONTRAST CT CERVICAL SPINE WITHOUT CONTRAST TECHNIQUE: Multidetector CT imaging of the head, cervical spine, and maxillofacial structures were performed using the standard protocol without intravenous contrast. Multiplanar CT image reconstructions of the cervical spine and maxillofacial structures were also generated. COMPARISON:  None. FINDINGS: CT HEAD FINDINGS Brain: Motion degradation. No acute territorial infarction, hemorrhage or intracranial mass is visualized. Mild small vessel ischemic changes of the white matter. Mild atrophy. Normal ventricle size. Vascular: No hyperdense vessels. Scattered calcifications at the carotid siphons. Skull: No fracture.  Hyperostosis frontalis interna. Other: Large left forehead hematoma. CT MAXILLOFACIAL FINDINGS Osseous: Motion degradation. The mandibular heads are normally position. No obvious mandibular fracture. Mastoids are grossly clear. Pterygoid plates and zygomatic arches are intact. Questionable right nasal bone fracture versus motion artifact. Orbits: Negative. No traumatic or inflammatory finding. Sinuses: No  acute fluid levels.  No sinus wall fracture Soft tissues: Negative.  Fatty replacement of the parotid glands CT CERVICAL SPINE FINDINGS Alignment: No subluxation.  Facet alignment is within normal limits. Skull base and vertebrae: No acute fracture. No primary bone lesion or focal pathologic process. Soft tissues and spinal canal: No prevertebral fluid or swelling. No visible canal hematoma. Disc levels: Mild degenerative spurring of the spine. No significant disc space narrowing. Upper chest: Negative. Other: None IMPRESSION: 1. Motion artifact limits the examination 2. No definite CT evidence for acute intracranial abnormality. Large forehead hematoma 3. Questionable acute right nasal bone fracture versus motion artifact. Otherwise no acute facial bone fracture is seen 4. No acute osseous abnormality of the cervical spine Electronically Signed   By: Donavan Foil M.D.   On: 08/30/2017 15:45   Ct Cervical Spine Wo Contrast  Result Date: 08/30/2017 CLINICAL DATA:  [address] and hit forehead with swelling and laceration to bridge of nose EXAM: CT HEAD WITHOUT CONTRAST CT MAXILLOFACIAL WITHOUT CONTRAST CT CERVICAL SPINE WITHOUT CONTRAST TECHNIQUE: Multidetector CT imaging of the head, cervical spine, and maxillofacial structures were performed using the standard protocol without intravenous contrast. Multiplanar CT image reconstructions of the cervical spine and maxillofacial structures were also generated. COMPARISON:  None. FINDINGS: CT HEAD FINDINGS Brain: Motion degradation. No acute territorial infarction, hemorrhage or intracranial mass is visualized. Mild small vessel ischemic changes of the white matter. Mild atrophy. Normal ventricle size. Vascular: No hyperdense  vessels. Scattered calcifications at the carotid siphons. Skull: No fracture.  Hyperostosis frontalis interna. Other: Large left forehead hematoma. CT MAXILLOFACIAL FINDINGS Osseous: Motion degradation. The mandibular heads are normally position. No  obvious mandibular fracture. Mastoids are grossly clear. Pterygoid plates and zygomatic arches are intact. Questionable right nasal bone fracture versus motion artifact. Orbits: Negative. No traumatic or inflammatory finding. Sinuses: No acute fluid levels.  No sinus wall fracture Soft tissues: Negative.  Fatty replacement of the parotid glands CT CERVICAL SPINE FINDINGS Alignment: No subluxation.  Facet alignment is within normal limits. Skull base and vertebrae: No acute fracture. No primary bone lesion or focal pathologic process. Soft tissues and spinal canal: No prevertebral fluid or swelling. No visible canal hematoma. Disc levels: Mild degenerative spurring of the spine. No significant disc space narrowing. Upper chest: Negative. Other: None IMPRESSION: 1. Motion artifact limits the examination 2. No definite CT evidence for acute intracranial abnormality. Large forehead hematoma 3. Questionable acute right nasal bone fracture versus motion artifact. Otherwise no acute facial bone fracture is seen 4. No acute osseous abnormality of the cervical spine Electronically Signed   By: Donavan Foil M.D.   On: 08/30/2017 15:45   Ct Maxillofacial Wo Contrast  Result Date: 08/30/2017 CLINICAL DATA:  [address] and hit forehead with swelling and laceration to bridge of nose EXAM: CT HEAD WITHOUT CONTRAST CT MAXILLOFACIAL WITHOUT CONTRAST CT CERVICAL SPINE WITHOUT CONTRAST TECHNIQUE: Multidetector CT imaging of the head, cervical spine, and maxillofacial structures were performed using the standard protocol without intravenous contrast. Multiplanar CT image reconstructions of the cervical spine and maxillofacial structures were also generated. COMPARISON:  None. FINDINGS: CT HEAD FINDINGS Brain: Motion degradation. No acute territorial infarction, hemorrhage or intracranial mass is visualized. Mild small vessel ischemic changes of the white matter. Mild atrophy. Normal ventricle size. Vascular: No hyperdense vessels.  Scattered calcifications at the carotid siphons. Skull: No fracture.  Hyperostosis frontalis interna. Other: Large left forehead hematoma. CT MAXILLOFACIAL FINDINGS Osseous: Motion degradation. The mandibular heads are normally position. No obvious mandibular fracture. Mastoids are grossly clear. Pterygoid plates and zygomatic arches are intact. Questionable right nasal bone fracture versus motion artifact. Orbits: Negative. No traumatic or inflammatory finding. Sinuses: No acute fluid levels.  No sinus wall fracture Soft tissues: Negative.  Fatty replacement of the parotid glands CT CERVICAL SPINE FINDINGS Alignment: No subluxation.  Facet alignment is within normal limits. Skull base and vertebrae: No acute fracture. No primary bone lesion or focal pathologic process. Soft tissues and spinal canal: No prevertebral fluid or swelling. No visible canal hematoma. Disc levels: Mild degenerative spurring of the spine. No significant disc space narrowing. Upper chest: Negative. Other: None IMPRESSION: 1. Motion artifact limits the examination 2. No definite CT evidence for acute intracranial abnormality. Large forehead hematoma 3. Questionable acute right nasal bone fracture versus motion artifact. Otherwise no acute facial bone fracture is seen 4. No acute osseous abnormality of the cervical spine Electronically Signed   By: Donavan Foil M.D.   On: 08/30/2017 15:45    ____________________________________________   PROCEDURES  Procedure(s) performed:   Procedures  None ____________________________________________   INITIAL IMPRESSION / ASSESSMENT AND PLAN / ED COURSE  Pertinent labs & imaging results that were available during my care of the patient were reviewed by me and considered in my medical decision making (see chart for details).  Patient with large hematoma on the forehead with overlying abrasion.  No laceration.  Similarly the nose has a large abrasion without deformity.  No  septal  hematoma.  Patient's neurological exam is otherwise intact.  Plan for imaging of the head, face, C-spine and reassessment.  04:20 PM CT and plain film imaging reviewed with no acute findings.  Plan for basic wound care.  Patient likely has a small nasal bone fracture.  Advised PCP follow-up with referral to ENT if symptoms continue.  Discussed basic wound management at home.  Discussed return precautions in detail.  At this time, I do not feel there is any life-threatening condition present. I have reviewed and discussed all results (EKG, imaging, lab, urine as appropriate), exam findings with patient. I have reviewed nursing notes and appropriate previous records.  I feel the patient is safe to be discharged home without further emergent workup. Discussed usual and customary return precautions. Patient and family (if present) verbalize understanding and are comfortable with this plan.  Patient will follow-up with their primary care provider. If they do not have a primary care provider, information for follow-up has been provided to them. All questions have been answered.  ____________________________________________  FINAL CLINICAL IMPRESSION(S) / ED DIAGNOSES  Final diagnoses:  Fall, initial encounter  Injury of head, initial encounter  Contusion of face, initial encounter  Closed fracture of nasal bone, initial encounter     MEDICATIONS GIVEN DURING THIS VISIT:  Medications  ondansetron (ZOFRAN) injection 4 mg (4 mg Intravenous Given 08/30/17 1536)  bacitracin 500 UNIT/GM ointment (1 application  Given 63/87/56 1704)    Note:  This document was prepared using Dragon voice recognition software and may include unintentional dictation errors.  Nanda Quinton, MD Emergency Medicine    Glynna Failla, [facility], MD 08/30/17 [phone]

## 2017-08-30 NOTE — ED Triage Notes (Signed)
Patient brought in by EMS, states she fell off the curb at [facility], hitting her forehead. Patient has swelling to forehead and laceration to bridge of nose. Patient is on plavix. Denies LOC. Also complaining of swelling and pain to right knee.

## 2017-08-30 NOTE — Discharge Instructions (Signed)
You were seen in the Emergency Department (ED) today for a head injury. Your CT scan did not show any evidence of serious injury or bleeding.    You could develop a concussion from this injury but are showing no signs of that at this time. Symptoms to expect from a concussion include nausea, mild to moderate headache, difficulty concentrating or sleeping, and mild lightheadedness.  These symptoms should improve over the next few days to weeks, but it may take many weeks before you feel back to normal.  Return to the emergency department or follow-up with your primary care doctor if your symptoms are not improving over this time.  Signs of a more serious head injury include vomiting, severe headache, excessive sleepiness or confusion, and weakness or numbness in your face, arms or legs.  Return immediately to the Emergency Department if you experience any of these more concerning symptoms.    Rest, avoid strenuous physical or mental activity, and avoid activities that could potentially result in another head injury until all your symptoms from this head injury are completely resolved for at least 2-3 weeks. You may take acetaminophen over the counter according to label instructions for mild headache or scalp soreness.

## 2017-08-30 NOTE — ED Notes (Signed)
Patient complaining of nausea. Advised Dr Laverta Baltimore, zofran 4mg  IV to be given.

## 2017-09-17 DIAGNOSIS — W101XXA Fall (on)(from) sidewalk curb, initial encounter: Secondary | ICD-10-CM | POA: Diagnosis not present

## 2017-09-17 DIAGNOSIS — Z6841 Body Mass Index (BMI) 40.0 and over, adult: Secondary | ICD-10-CM | POA: Diagnosis not present

## 2017-09-17 DIAGNOSIS — M7989 Other specified soft tissue disorders: Secondary | ICD-10-CM | POA: Diagnosis not present

## 2017-09-17 DIAGNOSIS — S022XXA Fracture of nasal bones, initial encounter for closed fracture: Secondary | ICD-10-CM | POA: Diagnosis not present

## 2017-09-17 DIAGNOSIS — S0003XA Contusion of scalp, initial encounter: Secondary | ICD-10-CM | POA: Diagnosis not present

## 2017-10-01 DIAGNOSIS — M10071 Idiopathic gout, right ankle and foot: Secondary | ICD-10-CM | POA: Diagnosis not present

## 2017-10-01 DIAGNOSIS — N183 Chronic kidney disease, stage 3 (moderate): Secondary | ICD-10-CM | POA: Diagnosis not present

## 2017-12-08 DIAGNOSIS — J309 Allergic rhinitis, unspecified: Secondary | ICD-10-CM | POA: Diagnosis not present

## 2017-12-08 DIAGNOSIS — I10 Essential (primary) hypertension: Secondary | ICD-10-CM | POA: Diagnosis not present

## 2017-12-08 DIAGNOSIS — I509 Heart failure, unspecified: Secondary | ICD-10-CM | POA: Diagnosis not present

## 2017-12-08 DIAGNOSIS — R05 Cough: Secondary | ICD-10-CM | POA: Diagnosis not present

## 2017-12-08 DIAGNOSIS — J209 Acute bronchitis, unspecified: Secondary | ICD-10-CM | POA: Diagnosis not present

## 2018-01-19 DIAGNOSIS — R7309 Other abnormal glucose: Secondary | ICD-10-CM | POA: Diagnosis not present

## 2018-01-19 DIAGNOSIS — E7849 Other hyperlipidemia: Secondary | ICD-10-CM | POA: Diagnosis not present

## 2018-01-19 DIAGNOSIS — M10071 Idiopathic gout, right ankle and foot: Secondary | ICD-10-CM | POA: Diagnosis not present

## 2018-01-19 DIAGNOSIS — Z0000 Encounter for general adult medical examination without abnormal findings: Secondary | ICD-10-CM | POA: Diagnosis not present

## 2018-01-19 DIAGNOSIS — R82998 Other abnormal findings in urine: Secondary | ICD-10-CM | POA: Diagnosis not present

## 2018-01-19 DIAGNOSIS — Z13820 Encounter for screening for osteoporosis: Secondary | ICD-10-CM | POA: Diagnosis not present

## 2018-01-27 DIAGNOSIS — R7309 Other abnormal glucose: Secondary | ICD-10-CM | POA: Diagnosis not present

## 2018-01-27 DIAGNOSIS — Z6841 Body Mass Index (BMI) 40.0 and over, adult: Secondary | ICD-10-CM | POA: Diagnosis not present

## 2018-01-27 DIAGNOSIS — G458 Other transient cerebral ischemic attacks and related syndromes: Secondary | ICD-10-CM | POA: Diagnosis not present

## 2018-01-27 DIAGNOSIS — E876 Hypokalemia: Secondary | ICD-10-CM | POA: Diagnosis not present

## 2018-01-27 DIAGNOSIS — M10071 Idiopathic gout, right ankle and foot: Secondary | ICD-10-CM | POA: Diagnosis not present

## 2018-01-27 DIAGNOSIS — E7849 Other hyperlipidemia: Secondary | ICD-10-CM | POA: Diagnosis not present

## 2018-01-27 DIAGNOSIS — Z1389 Encounter for screening for other disorder: Secondary | ICD-10-CM | POA: Diagnosis not present

## 2018-01-27 DIAGNOSIS — Z0000 Encounter for general adult medical examination without abnormal findings: Secondary | ICD-10-CM | POA: Diagnosis not present

## 2018-01-27 DIAGNOSIS — I130 Hypertensive heart and chronic kidney disease with heart failure and stage 1 through stage 4 chronic kidney disease, or unspecified chronic kidney disease: Secondary | ICD-10-CM | POA: Diagnosis not present

## 2018-01-27 DIAGNOSIS — I509 Heart failure, unspecified: Secondary | ICD-10-CM | POA: Diagnosis not present

## 2018-01-27 DIAGNOSIS — I10 Essential (primary) hypertension: Secondary | ICD-10-CM | POA: Diagnosis not present

## 2018-01-27 DIAGNOSIS — N183 Chronic kidney disease, stage 3 (moderate): Secondary | ICD-10-CM | POA: Diagnosis not present

## 2018-02-08 DIAGNOSIS — Z1212 Encounter for screening for malignant neoplasm of rectum: Secondary | ICD-10-CM | POA: Diagnosis not present

## 2018-02-28 DIAGNOSIS — I10 Essential (primary) hypertension: Secondary | ICD-10-CM | POA: Diagnosis not present

## 2018-02-28 DIAGNOSIS — Z6841 Body Mass Index (BMI) 40.0 and over, adult: Secondary | ICD-10-CM | POA: Diagnosis not present

## 2018-02-28 DIAGNOSIS — Z5181 Encounter for therapeutic drug level monitoring: Secondary | ICD-10-CM | POA: Diagnosis not present

## 2018-02-28 DIAGNOSIS — I509 Heart failure, unspecified: Secondary | ICD-10-CM | POA: Diagnosis not present

## 2018-02-28 DIAGNOSIS — N183 Chronic kidney disease, stage 3 (moderate): Secondary | ICD-10-CM | POA: Diagnosis not present

## 2018-02-28 DIAGNOSIS — E876 Hypokalemia: Secondary | ICD-10-CM | POA: Diagnosis not present

## 2018-02-28 DIAGNOSIS — I130 Hypertensive heart and chronic kidney disease with heart failure and stage 1 through stage 4 chronic kidney disease, or unspecified chronic kidney disease: Secondary | ICD-10-CM | POA: Diagnosis not present

## 2018-02-28 DIAGNOSIS — M109 Gout, unspecified: Secondary | ICD-10-CM | POA: Diagnosis not present

## 2018-04-04 DIAGNOSIS — R600 Localized edema: Secondary | ICD-10-CM | POA: Diagnosis not present

## 2018-04-04 DIAGNOSIS — M7120 Synovial cyst of popliteal space [Baker], unspecified knee: Secondary | ICD-10-CM | POA: Diagnosis not present

## 2018-04-04 DIAGNOSIS — Z6841 Body Mass Index (BMI) 40.0 and over, adult: Secondary | ICD-10-CM | POA: Diagnosis not present

## 2018-04-04 DIAGNOSIS — M79661 Pain in right lower leg: Secondary | ICD-10-CM | POA: Diagnosis not present

## 2018-04-05 ENCOUNTER — Ambulatory Visit (HOSPITAL_COMMUNITY)
Admission: RE | Admit: 2018-04-05 | Discharge: 2018-04-05 | Disposition: A | Discharge status: Home / Self Care | Payer: Medicare Other | Source: Ambulatory Visit | Attending: Family | Admitting: Family

## 2018-04-05 ENCOUNTER — Other Ambulatory Visit (HOSPITAL_COMMUNITY): Payer: Self-pay | Admitting: Internal Medicine

## 2018-04-05 DIAGNOSIS — M79661 Pain in right lower leg: Secondary | ICD-10-CM

## 2018-04-05 DIAGNOSIS — M7120 Synovial cyst of popliteal space [Baker], unspecified knee: Secondary | ICD-10-CM | POA: Diagnosis not present

## 2018-04-05 DIAGNOSIS — M7121 Synovial cyst of popliteal space [Baker], right knee: Secondary | ICD-10-CM

## 2018-04-05 DIAGNOSIS — R600 Localized edema: Secondary | ICD-10-CM

## 2018-04-28 DIAGNOSIS — I10 Essential (primary) hypertension: Secondary | ICD-10-CM | POA: Diagnosis not present

## 2018-04-28 DIAGNOSIS — I130 Hypertensive heart and chronic kidney disease with heart failure and stage 1 through stage 4 chronic kidney disease, or unspecified chronic kidney disease: Secondary | ICD-10-CM | POA: Diagnosis not present

## 2018-04-28 DIAGNOSIS — M25562 Pain in left knee: Secondary | ICD-10-CM | POA: Diagnosis not present

## 2018-04-28 DIAGNOSIS — N183 Chronic kidney disease, stage 3 (moderate): Secondary | ICD-10-CM | POA: Diagnosis not present

## 2018-04-28 DIAGNOSIS — M25561 Pain in right knee: Secondary | ICD-10-CM | POA: Diagnosis not present

## 2018-04-28 DIAGNOSIS — I509 Heart failure, unspecified: Secondary | ICD-10-CM | POA: Diagnosis not present

## 2018-04-28 DIAGNOSIS — R7309 Other abnormal glucose: Secondary | ICD-10-CM | POA: Diagnosis not present

## 2018-04-28 DIAGNOSIS — E7849 Other hyperlipidemia: Secondary | ICD-10-CM | POA: Diagnosis not present

## 2018-04-28 DIAGNOSIS — Z6841 Body Mass Index (BMI) 40.0 and over, adult: Secondary | ICD-10-CM | POA: Diagnosis not present

## 2018-04-28 DIAGNOSIS — M109 Gout, unspecified: Secondary | ICD-10-CM | POA: Diagnosis not present

## 2018-05-05 DIAGNOSIS — M1712 Unilateral primary osteoarthritis, left knee: Secondary | ICD-10-CM | POA: Diagnosis not present

## 2018-05-05 DIAGNOSIS — M1711 Unilateral primary osteoarthritis, right knee: Secondary | ICD-10-CM | POA: Diagnosis not present

## 2018-05-23 DIAGNOSIS — M1711 Unilateral primary osteoarthritis, right knee: Secondary | ICD-10-CM | POA: Diagnosis not present

## 2018-05-23 DIAGNOSIS — M1712 Unilateral primary osteoarthritis, left knee: Secondary | ICD-10-CM | POA: Diagnosis not present

## 2018-06-06 DIAGNOSIS — H25811 Combined forms of age-related cataract, right eye: Secondary | ICD-10-CM | POA: Diagnosis not present

## 2018-06-06 DIAGNOSIS — H353132 Nonexudative age-related macular degeneration, bilateral, intermediate dry stage: Secondary | ICD-10-CM | POA: Diagnosis not present

## 2018-07-01 DIAGNOSIS — Z23 Encounter for immunization: Secondary | ICD-10-CM | POA: Diagnosis not present

## 2018-08-12 ENCOUNTER — Encounter (HOSPITAL_COMMUNITY): Payer: Self-pay

## 2018-08-12 ENCOUNTER — Other Ambulatory Visit: Payer: Self-pay

## 2018-08-12 ENCOUNTER — Emergency Department (HOSPITAL_COMMUNITY): Payer: Medicare Other

## 2018-08-12 ENCOUNTER — Emergency Department (HOSPITAL_COMMUNITY)
Admission: EM | Admit: 2018-08-12 | Discharge: 2018-08-12 | Disposition: A | Discharge status: Home / Self Care | Payer: Medicare Other | Attending: Emergency Medicine | Admitting: Emergency Medicine

## 2018-08-12 DIAGNOSIS — R05 Cough: Secondary | ICD-10-CM | POA: Diagnosis not present

## 2018-08-12 DIAGNOSIS — R0602 Shortness of breath: Secondary | ICD-10-CM | POA: Diagnosis not present

## 2018-08-12 DIAGNOSIS — R079 Chest pain, unspecified: Secondary | ICD-10-CM | POA: Insufficient documentation

## 2018-08-12 DIAGNOSIS — Z79899 Other long term (current) drug therapy: Secondary | ICD-10-CM | POA: Diagnosis not present

## 2018-08-12 DIAGNOSIS — I10 Essential (primary) hypertension: Secondary | ICD-10-CM | POA: Insufficient documentation

## 2018-08-12 HISTORY — DX: Pneumonia, unspecified organism: J18.9

## 2018-08-12 MED ORDER — DOXYCYCLINE HYCLATE 100 MG PO CAPS: 100.0000 mg | ORAL_CAPSULE | Freq: Two times a day (BID) | ORAL | 0 refills | Status: DC

## 2018-08-12 NOTE — Discharge Instructions (Addendum)
Chest x-ray shows the possibility of a small amount of extra fluid.  Would take 1-1/2 fluid pills for the next 3 days.  If your cough and sputum worsen, recommend starting antibiotics.  Prescription given.  Follow-up with your primary care doctor.

## 2018-08-12 NOTE — ED Triage Notes (Signed)
Pt reports that she has been coughing for 2 days. SOB increasing Cough is productive . Mucous is clear to milky. Coming of pain under right breast

## 2018-08-12 NOTE — ED Provider Notes (Signed)
[facility] ([facility]) EMERGENCY DEPARTMENT Provider Note   CSN: [id number] Arrival date & time: 08/12/18  4098     History   Chief Complaint Chief Complaint  Patient presents with  . Cough    HPI Brooke Lane is a 84 y.o. female.  Patient presents with right anterior lateral inferior chest pain for 48 hours with associated cough.  She is concerned about pneumonia.  No substernal chest pain, dyspnea, diaphoresis, nausea.  Productive cough is clear to milky in color.  Severity of symptoms is mild to moderate.  Coughing makes symptoms worse.     Past Medical History:  Diagnosis Date  . Hiatal hernia   . High cholesterol   . Hypertension   . Pneumonia   . Stroke [facility])     Patient Active Problem List   Diagnosis Date Noted  . Acute metabolic encephalopathy 11/91/4782  . Acute diastolic CHF (congestive heart failure) ([place]) 12/03/2016  . Community acquired pneumonia 12/01/2016  . Acute respiratory failure with hypoxia ([place]) 12/01/2016  . Chronic constipation 12/01/2016  . Acute respiratory failure with hypoxia and hypercarbia ([place]) 12/01/2016  . Hypertension   . High cholesterol   . Acute on chronic alteration in mental status   . Flash pulmonary edema [facility])     Past Surgical History:  Procedure Laterality Date  . BREAST LUMPECTOMY    . TONSILLECTOMY       OB History   None      Home Medications    Prior to Admission medications   Medication Sig Start Date End Date Taking? Authorizing Provider  amLODipine (NORVASC) 5 MG tablet Take 1 tablet (5 mg total) by mouth daily. 12/10/16   Hosie Poisson, MD  clopidogrel (PLAVIX) 75 MG tablet Take 75 mg by mouth daily.    [provider]  dextromethorphan-guaiFENesin (MUCINEX DM) 30-600 MG 12hr tablet Take 1 tablet by mouth 2 (two) times daily as needed for cough.    [provider]  diphenhydrAMINE (BENADRYL) 25 mg capsule Take 25 mg by mouth every 6 (six) hours as needed for allergies.    [provider]  docusate sodium (COLACE) 100 MG capsule Take 1 capsule (100 mg total) by mouth 2 (two) times daily. 12/09/16   Hosie Poisson, MD  doxycycline (VIBRAMYCIN) 100 MG capsule Take 1 capsule (100 mg total) by mouth 2 (two) times daily. 08/12/18   Nat Christen, MD  furosemide (LASIX) 40 MG tablet Take 1 tablet (40 mg total) by mouth daily. 12/10/16   Hosie Poisson, MD  gemfibrozil (LOPID) 600 MG tablet Take 600 mg by mouth 2 (two) times daily before a meal.    [provider]  sodium chloride (OCEAN) 0.65 % SOLN nasal spray Place 1 spray into both nostrils as needed for congestion.    [provider]    Family History No family history on file.  Social History Social History   Tobacco Use  . Smoking status: Never Smoker  . Smokeless tobacco: Never Used  Substance Use Topics  . Alcohol use: No    Alcohol/week: 0.0 standard drinks  . Drug use: No     Allergies   Patient has no known allergies.   Review of Systems Review of Systems  All other systems reviewed and are negative.    Physical Exam Updated Vital Signs BP (!) 136/44 (BP Location: Left Arm)   Pulse 65   Temp 98 F (36.7 C) (Oral)   Resp 19   Ht 5\' 6"  (1.676 m)  Wt 124.3 kg   SpO2 91%   BMI 44.22 kg/m   Physical Exam  Constitutional: She is oriented to person, place, and time. She appears well-developed and well-nourished.  HENT:  Head: Normocephalic and atraumatic.  Eyes: Conjunctivae are normal.  Neck: Neck supple.  Cardiovascular: Normal rate and regular rhythm.  Pulmonary/Chest: Effort normal and breath sounds normal.  Minimal tenderness inferior anterior lateral chest wall.  Abdominal: Soft. Bowel sounds are normal.  Musculoskeletal: Normal range of motion.  Neurological: She is alert and oriented to person, place, and time.  Skin: Skin is warm and dry.  Psychiatric: She has a normal mood and affect. Her behavior is normal.  Nursing note and vitals reviewed.    ED  Treatments / Results  Labs (all labs ordered are listed, but only abnormal results are displayed) Labs Reviewed - No data to display  EKG EKG Interpretation  Date/Time:  Friday August 12 2018 07:53:26 EST Ventricular Rate:  71 PR Interval:    QRS Duration: 129 QT Interval:  449 QTC Calculation: 488 R Axis:   -52 Text Interpretation:  Sinus rhythm RBBB and LAFB Probable left ventricular hypertrophy Lateral infarct, old Confirmed by Nat Christen [phone]) on 08/12/2018 8:28:46 AM   Radiology Dg Chest 2 View  Result Date: 08/12/2018 CLINICAL DATA:  Cough and shortness of breath. EXAM: CHEST - 2 VIEW COMPARISON:  12/02/2016 FINDINGS: Very low lung volumes with bibasilar atelectasis. No overt airspace consolidation. Component of mild interstitial edema cannot be entirely excluded. No pleural effusions, nodules or pneumothorax identified. The heart size is stable. The bony thorax shows stable osteopenia and degenerative disease of the thoracic spine. IMPRESSION: Low volumes with bibasilar atelectasis. Cannot exclude component of mild pulmonary interstitial edema. Electronically Signed   By: Aletta Edouard M.D.   On: 08/12/2018 08:38    Procedures Procedures (including critical care time)  Medications Ordered in ED Medications - No data to display   Initial Impression / Assessment and Plan / ED Course  I have reviewed the triage vital signs and the nursing notes.  Pertinent labs & imaging results that were available during my care of the patient were reviewed by me and considered in my medical decision making (see chart for details).     Patient is in no acute distress.  History and physical most consistent with a possible upper respiratory infection.  EKG negative.  Chest x-ray suggests questionable mild pulmonary edema.  Will increase Lasix to 50% for 3 days.  Also given a prescription for doxycycline 100 mg if symptoms worsen.  This was discussed with the patient and her daughter  who is an Therapist, sports.  They agree with this treatment plan.  Final Clinical Impressions(s) / ED Diagnoses   Final diagnoses:  Chest pain, unspecified type    ED Discharge Orders         Ordered    doxycycline (VIBRAMYCIN) 100 MG capsule  2 times daily     08/12/18 0937           Nat Christen, MD 08/12/18 1144

## 2018-08-12 NOTE — ED Notes (Signed)
Patient transported to X-ray 

## 2018-08-16 IMAGING — CT CT CERVICAL SPINE W/O CM
3 of 13 series · 10 of 35 positions shown, 11 images · non-contrast
Comparison: None.

CLINICAL DATA: Fell and hit forehead with swelling and laceration
to bridge of nose

EXAM:
CT HEAD WITHOUT CONTRAST
CT MAXILLOFACIAL WITHOUT CONTRAST
CT CERVICAL SPINE WITHOUT CONTRAST
TECHNIQUE: Multidetector CT imaging of the head, cervical spine, and
maxillofacial structures were performed using the standard protocol
without intravenous contrast. Multiplanar CT image reconstructions
of the cervical spine and maxillofacial structures were also
generated.

[Series 12: max soft · axial · 0.35mm/px · z∈[-41,+43]mm · 3 of 85 slices shown]
[im 22/85  soft-tissue]
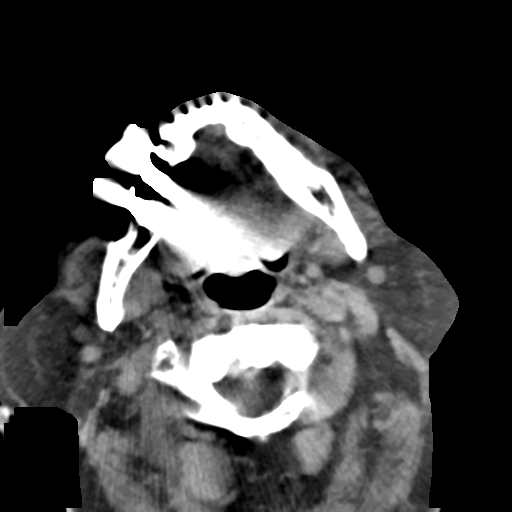
[im 43/85  soft-tissue]
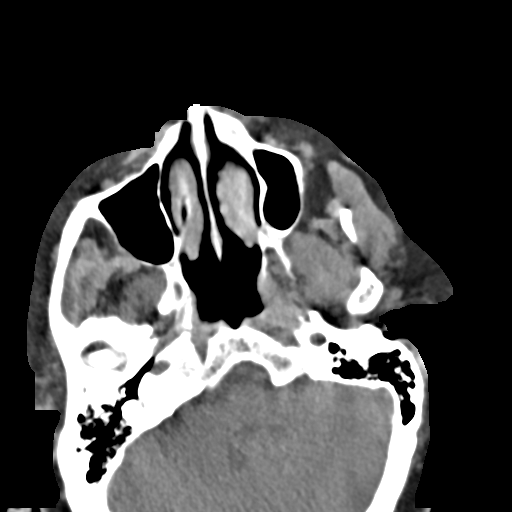
[im 64/85  soft-tissue]
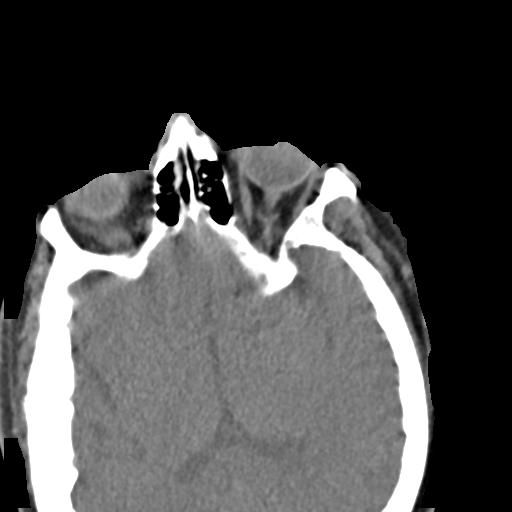

[Series 19: sagittal bone · sagittal · 0.32mm/px · 3 of 89 slices shown]
[im 4/89  soft-tissue]
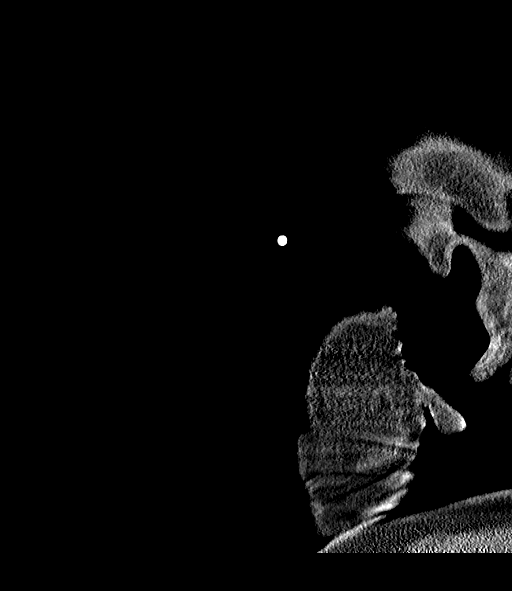
[im 30/89  bone]
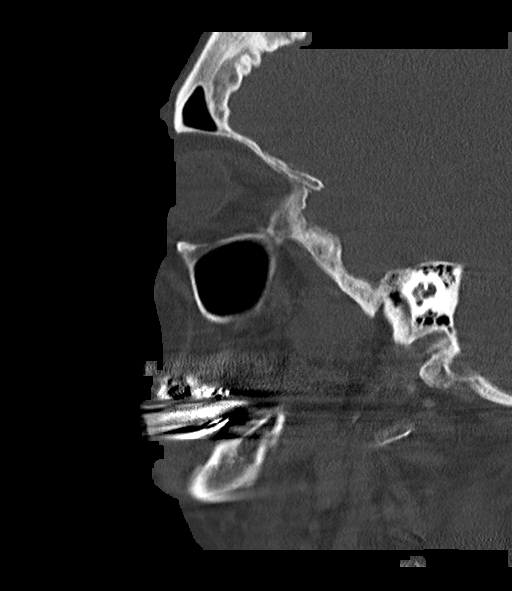
[im 59/89  bone]
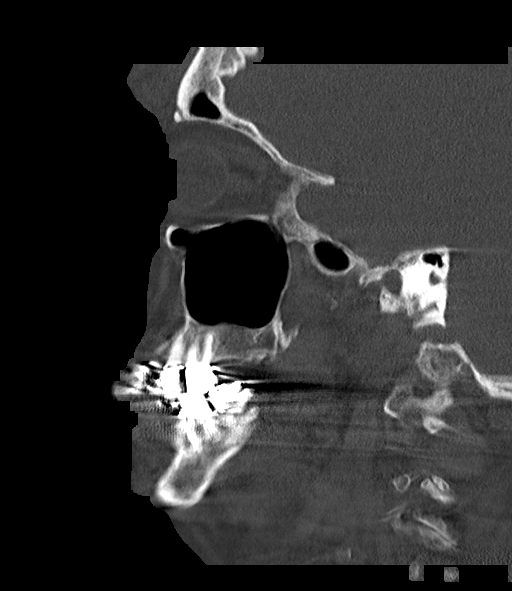

[Series 21: c spine soft · axial · 0.32mm/px · z∈[-128,-14]mm · 4 of 97 slices shown, 5 images]
[im 20/97  soft-tissue]
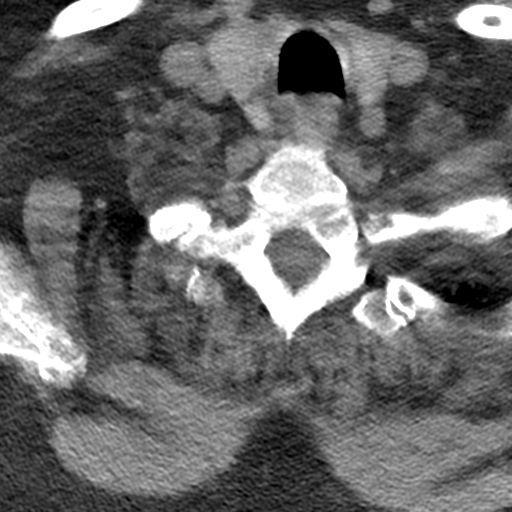
[im 20/97  bone]
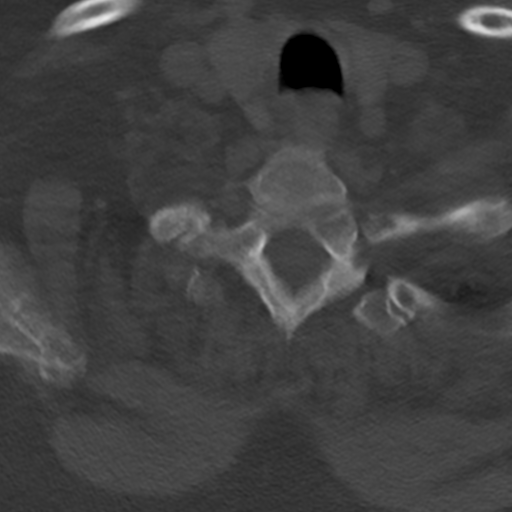
[im 39/97  bone]
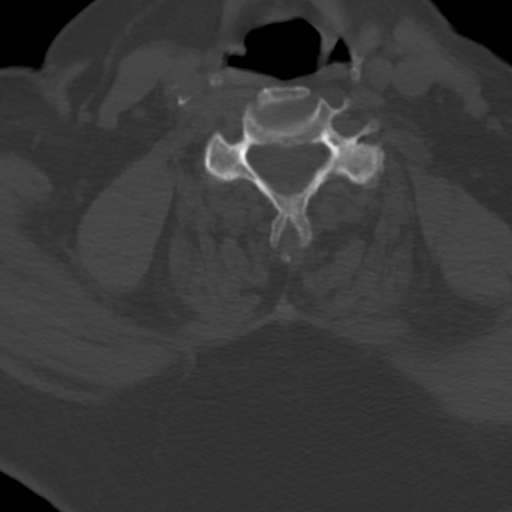
[im 58/97  bone]
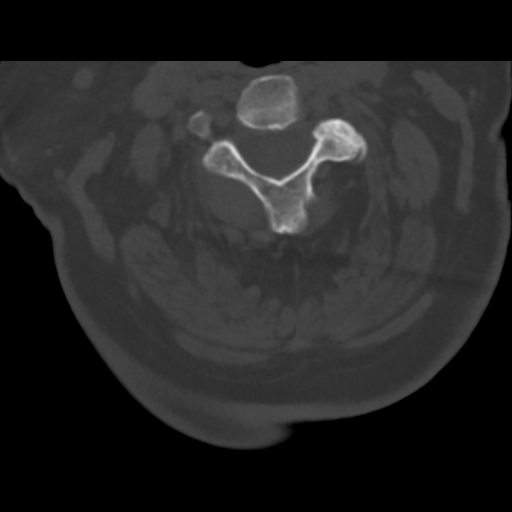
[im 77/97  bone]
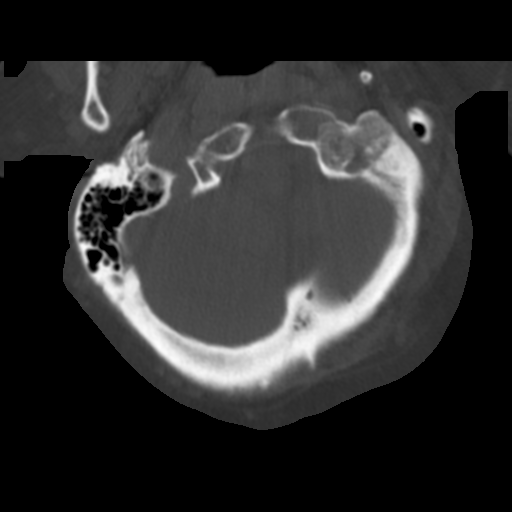

[10 of 35 positions shown; findings below may reference images not displayed]

FINDINGS: CT HEAD FINDINGS

Brain: Motion degradation. No acute territorial infarction,
hemorrhage or intracranial mass is visualized. Mild small vessel
ischemic changes of the white matter. Mild atrophy. Normal ventricle
size.

Vascular: No hyperdense vessels. Scattered calcifications at the
carotid siphons.

Skull: No fracture.  Hyperostosis frontalis interna.

Other: Large left forehead hematoma.

CT MAXILLOFACIAL FINDINGS

Osseous: Motion degradation. The mandibular heads are normally
position. No obvious mandibular fracture. Mastoids are grossly
clear. Pterygoid plates and zygomatic arches are intact.
Questionable right nasal bone fracture versus motion artifact.

Orbits: Negative. No traumatic or inflammatory finding.

Sinuses: No acute fluid levels.  No sinus wall fracture

Soft tissues: Negative.  Fatty replacement of the parotid glands

CT CERVICAL SPINE FINDINGS

Alignment: No subluxation.  Facet alignment is within normal limits.

Skull base and vertebrae: No acute fracture. No primary bone lesion
or focal pathologic process.

Soft tissues and spinal canal: No prevertebral fluid or swelling. No
visible canal hematoma.

Disc levels: Mild degenerative spurring of the spine. No significant
disc space narrowing.

Upper chest: Negative.

Other: None
IMPRESSION: 1. Motion artifact limits the examination
2. No definite CT evidence for acute intracranial abnormality. Large
forehead hematoma
3. Questionable acute right nasal bone fracture versus motion
artifact. Otherwise no acute facial bone fracture is seen
4. No acute osseous abnormality of the cervical spine

## 2018-08-22 DIAGNOSIS — J181 Lobar pneumonia, unspecified organism: Secondary | ICD-10-CM | POA: Diagnosis not present

## 2018-08-22 DIAGNOSIS — I509 Heart failure, unspecified: Secondary | ICD-10-CM | POA: Diagnosis not present

## 2018-08-22 DIAGNOSIS — Z6841 Body Mass Index (BMI) 40.0 and over, adult: Secondary | ICD-10-CM | POA: Diagnosis not present

## 2018-08-22 DIAGNOSIS — N183 Chronic kidney disease, stage 3 (moderate): Secondary | ICD-10-CM | POA: Diagnosis not present

## 2018-08-22 DIAGNOSIS — R5381 Other malaise: Secondary | ICD-10-CM | POA: Diagnosis not present

## 2018-08-22 DIAGNOSIS — I10 Essential (primary) hypertension: Secondary | ICD-10-CM | POA: Diagnosis not present

## 2018-08-24 DIAGNOSIS — M25561 Pain in right knee: Secondary | ICD-10-CM | POA: Diagnosis not present

## 2018-09-12 DIAGNOSIS — H353221 Exudative age-related macular degeneration, left eye, with active choroidal neovascularization: Secondary | ICD-10-CM | POA: Diagnosis not present

## 2018-09-12 DIAGNOSIS — H353111 Nonexudative age-related macular degeneration, right eye, early dry stage: Secondary | ICD-10-CM | POA: Diagnosis not present

## 2018-09-23 DIAGNOSIS — H3562 Retinal hemorrhage, left eye: Secondary | ICD-10-CM | POA: Diagnosis not present

## 2018-09-23 DIAGNOSIS — H353221 Exudative age-related macular degeneration, left eye, with active choroidal neovascularization: Secondary | ICD-10-CM | POA: Diagnosis not present

## 2018-09-27 DIAGNOSIS — Z6841 Body Mass Index (BMI) 40.0 and over, adult: Secondary | ICD-10-CM | POA: Diagnosis not present

## 2018-09-27 DIAGNOSIS — R7309 Other abnormal glucose: Secondary | ICD-10-CM | POA: Diagnosis not present

## 2018-09-27 DIAGNOSIS — I10 Essential (primary) hypertension: Secondary | ICD-10-CM | POA: Diagnosis not present

## 2018-09-27 DIAGNOSIS — M109 Gout, unspecified: Secondary | ICD-10-CM | POA: Diagnosis not present

## 2018-09-27 DIAGNOSIS — H3530 Unspecified macular degeneration: Secondary | ICD-10-CM | POA: Diagnosis not present

## 2018-10-12 DIAGNOSIS — H353221 Exudative age-related macular degeneration, left eye, with active choroidal neovascularization: Secondary | ICD-10-CM | POA: Diagnosis not present

## 2018-10-12 DIAGNOSIS — H353111 Nonexudative age-related macular degeneration, right eye, early dry stage: Secondary | ICD-10-CM | POA: Diagnosis not present

## 2018-10-24 DIAGNOSIS — D0439 Carcinoma in situ of skin of other parts of face: Secondary | ICD-10-CM | POA: Diagnosis not present

## 2018-10-24 DIAGNOSIS — L219 Seborrheic dermatitis, unspecified: Secondary | ICD-10-CM | POA: Diagnosis not present

## 2018-10-24 DIAGNOSIS — L570 Actinic keratosis: Secondary | ICD-10-CM | POA: Diagnosis not present

## 2018-10-24 DIAGNOSIS — I872 Venous insufficiency (chronic) (peripheral): Secondary | ICD-10-CM | POA: Diagnosis not present

## 2018-11-09 DIAGNOSIS — H353221 Exudative age-related macular degeneration, left eye, with active choroidal neovascularization: Secondary | ICD-10-CM | POA: Diagnosis not present

## 2018-11-17 DIAGNOSIS — D0439 Carcinoma in situ of skin of other parts of face: Secondary | ICD-10-CM | POA: Diagnosis not present

## 2018-12-07 DIAGNOSIS — H353221 Exudative age-related macular degeneration, left eye, with active choroidal neovascularization: Secondary | ICD-10-CM | POA: Diagnosis not present

## 2019-01-11 DIAGNOSIS — H353221 Exudative age-related macular degeneration, left eye, with active choroidal neovascularization: Secondary | ICD-10-CM | POA: Diagnosis not present

## 2019-01-31 DIAGNOSIS — R946 Abnormal results of thyroid function studies: Secondary | ICD-10-CM | POA: Diagnosis not present

## 2019-01-31 DIAGNOSIS — M109 Gout, unspecified: Secondary | ICD-10-CM | POA: Diagnosis not present

## 2019-01-31 DIAGNOSIS — R7309 Other abnormal glucose: Secondary | ICD-10-CM | POA: Diagnosis not present

## 2019-01-31 DIAGNOSIS — E7849 Other hyperlipidemia: Secondary | ICD-10-CM | POA: Diagnosis not present

## 2019-01-31 DIAGNOSIS — I130 Hypertensive heart and chronic kidney disease with heart failure and stage 1 through stage 4 chronic kidney disease, or unspecified chronic kidney disease: Secondary | ICD-10-CM | POA: Diagnosis not present

## 2019-02-07 DIAGNOSIS — R5383 Other fatigue: Secondary | ICD-10-CM | POA: Diagnosis not present

## 2019-02-07 DIAGNOSIS — M1990 Unspecified osteoarthritis, unspecified site: Secondary | ICD-10-CM | POA: Diagnosis not present

## 2019-02-07 DIAGNOSIS — E785 Hyperlipidemia, unspecified: Secondary | ICD-10-CM | POA: Diagnosis not present

## 2019-02-07 DIAGNOSIS — Z1331 Encounter for screening for depression: Secondary | ICD-10-CM | POA: Diagnosis not present

## 2019-02-07 DIAGNOSIS — M109 Gout, unspecified: Secondary | ICD-10-CM | POA: Diagnosis not present

## 2019-02-07 DIAGNOSIS — R7309 Other abnormal glucose: Secondary | ICD-10-CM | POA: Diagnosis not present

## 2019-02-07 DIAGNOSIS — N183 Chronic kidney disease, stage 3 (moderate): Secondary | ICD-10-CM | POA: Diagnosis not present

## 2019-02-07 DIAGNOSIS — H3530 Unspecified macular degeneration: Secondary | ICD-10-CM | POA: Diagnosis not present

## 2019-02-07 DIAGNOSIS — I130 Hypertensive heart and chronic kidney disease with heart failure and stage 1 through stage 4 chronic kidney disease, or unspecified chronic kidney disease: Secondary | ICD-10-CM | POA: Diagnosis not present

## 2019-02-07 DIAGNOSIS — Z0000 Encounter for general adult medical examination without abnormal findings: Secondary | ICD-10-CM | POA: Diagnosis not present

## 2019-02-07 DIAGNOSIS — I509 Heart failure, unspecified: Secondary | ICD-10-CM | POA: Diagnosis not present

## 2019-02-07 DIAGNOSIS — G459 Transient cerebral ischemic attack, unspecified: Secondary | ICD-10-CM | POA: Diagnosis not present

## 2019-02-22 DIAGNOSIS — H353221 Exudative age-related macular degeneration, left eye, with active choroidal neovascularization: Secondary | ICD-10-CM | POA: Diagnosis not present

## 2019-02-22 DIAGNOSIS — R82998 Other abnormal findings in urine: Secondary | ICD-10-CM | POA: Diagnosis not present

## 2019-02-22 DIAGNOSIS — N183 Chronic kidney disease, stage 3 (moderate): Secondary | ICD-10-CM | POA: Diagnosis not present

## 2019-03-31 DIAGNOSIS — L219 Seborrheic dermatitis, unspecified: Secondary | ICD-10-CM | POA: Diagnosis not present

## 2019-04-12 DIAGNOSIS — H353221 Exudative age-related macular degeneration, left eye, with active choroidal neovascularization: Secondary | ICD-10-CM | POA: Diagnosis not present

## 2019-05-03 DIAGNOSIS — I872 Venous insufficiency (chronic) (peripheral): Secondary | ICD-10-CM | POA: Diagnosis not present

## 2019-05-03 DIAGNOSIS — L239 Allergic contact dermatitis, unspecified cause: Secondary | ICD-10-CM | POA: Diagnosis not present

## 2019-05-08 DIAGNOSIS — N183 Chronic kidney disease, stage 3 (moderate): Secondary | ICD-10-CM | POA: Diagnosis not present

## 2019-05-08 DIAGNOSIS — R946 Abnormal results of thyroid function studies: Secondary | ICD-10-CM | POA: Diagnosis not present

## 2019-05-08 DIAGNOSIS — R5381 Other malaise: Secondary | ICD-10-CM | POA: Diagnosis not present

## 2019-05-08 DIAGNOSIS — J309 Allergic rhinitis, unspecified: Secondary | ICD-10-CM | POA: Diagnosis not present

## 2019-05-08 DIAGNOSIS — L219 Seborrheic dermatitis, unspecified: Secondary | ICD-10-CM | POA: Diagnosis not present

## 2019-05-08 DIAGNOSIS — I509 Heart failure, unspecified: Secondary | ICD-10-CM | POA: Diagnosis not present

## 2019-05-08 DIAGNOSIS — K5900 Constipation, unspecified: Secondary | ICD-10-CM | POA: Diagnosis not present

## 2019-05-08 DIAGNOSIS — M25561 Pain in right knee: Secondary | ICD-10-CM | POA: Diagnosis not present

## 2019-05-08 DIAGNOSIS — R7309 Other abnormal glucose: Secondary | ICD-10-CM | POA: Diagnosis not present

## 2019-05-08 DIAGNOSIS — I130 Hypertensive heart and chronic kidney disease with heart failure and stage 1 through stage 4 chronic kidney disease, or unspecified chronic kidney disease: Secondary | ICD-10-CM | POA: Diagnosis not present

## 2019-05-25 DIAGNOSIS — H353221 Exudative age-related macular degeneration, left eye, with active choroidal neovascularization: Secondary | ICD-10-CM | POA: Diagnosis not present

## 2019-06-05 DIAGNOSIS — L309 Dermatitis, unspecified: Secondary | ICD-10-CM | POA: Diagnosis not present

## 2019-06-05 DIAGNOSIS — L570 Actinic keratosis: Secondary | ICD-10-CM | POA: Diagnosis not present

## 2019-06-26 DIAGNOSIS — D485 Neoplasm of uncertain behavior of skin: Secondary | ICD-10-CM | POA: Diagnosis not present

## 2019-06-26 DIAGNOSIS — L309 Dermatitis, unspecified: Secondary | ICD-10-CM | POA: Diagnosis not present

## 2019-06-29 DIAGNOSIS — Z23 Encounter for immunization: Secondary | ICD-10-CM | POA: Diagnosis not present

## 2019-07-04 DIAGNOSIS — L309 Dermatitis, unspecified: Secondary | ICD-10-CM | POA: Diagnosis not present

## 2019-07-13 DIAGNOSIS — H353221 Exudative age-related macular degeneration, left eye, with active choroidal neovascularization: Secondary | ICD-10-CM | POA: Diagnosis not present

## 2019-07-24 DIAGNOSIS — L309 Dermatitis, unspecified: Secondary | ICD-10-CM | POA: Diagnosis not present

## 2019-07-25 DIAGNOSIS — J302 Other seasonal allergic rhinitis: Secondary | ICD-10-CM | POA: Diagnosis not present

## 2019-07-25 DIAGNOSIS — I129 Hypertensive chronic kidney disease with stage 1 through stage 4 chronic kidney disease, or unspecified chronic kidney disease: Secondary | ICD-10-CM | POA: Diagnosis not present

## 2019-07-25 DIAGNOSIS — R300 Dysuria: Secondary | ICD-10-CM | POA: Diagnosis not present

## 2019-07-25 DIAGNOSIS — M109 Gout, unspecified: Secondary | ICD-10-CM | POA: Diagnosis not present

## 2019-07-25 DIAGNOSIS — R7309 Other abnormal glucose: Secondary | ICD-10-CM | POA: Diagnosis not present

## 2019-07-25 DIAGNOSIS — N1832 Chronic kidney disease, stage 3b: Secondary | ICD-10-CM | POA: Diagnosis not present

## 2019-07-25 DIAGNOSIS — E785 Hyperlipidemia, unspecified: Secondary | ICD-10-CM | POA: Diagnosis not present

## 2019-07-25 DIAGNOSIS — R21 Rash and other nonspecific skin eruption: Secondary | ICD-10-CM | POA: Diagnosis not present

## 2019-07-26 DIAGNOSIS — Z029 Encounter for administrative examinations, unspecified: Secondary | ICD-10-CM | POA: Diagnosis not present

## 2019-07-29 IMAGING — DX DG CHEST 2V
2 series · 2 of 2 positions shown · non-contrast
Comparison: 12/02/2016

CLINICAL DATA: Cough and shortness of breath.

EXAM:
CHEST - 2 VIEW

[chest lat]
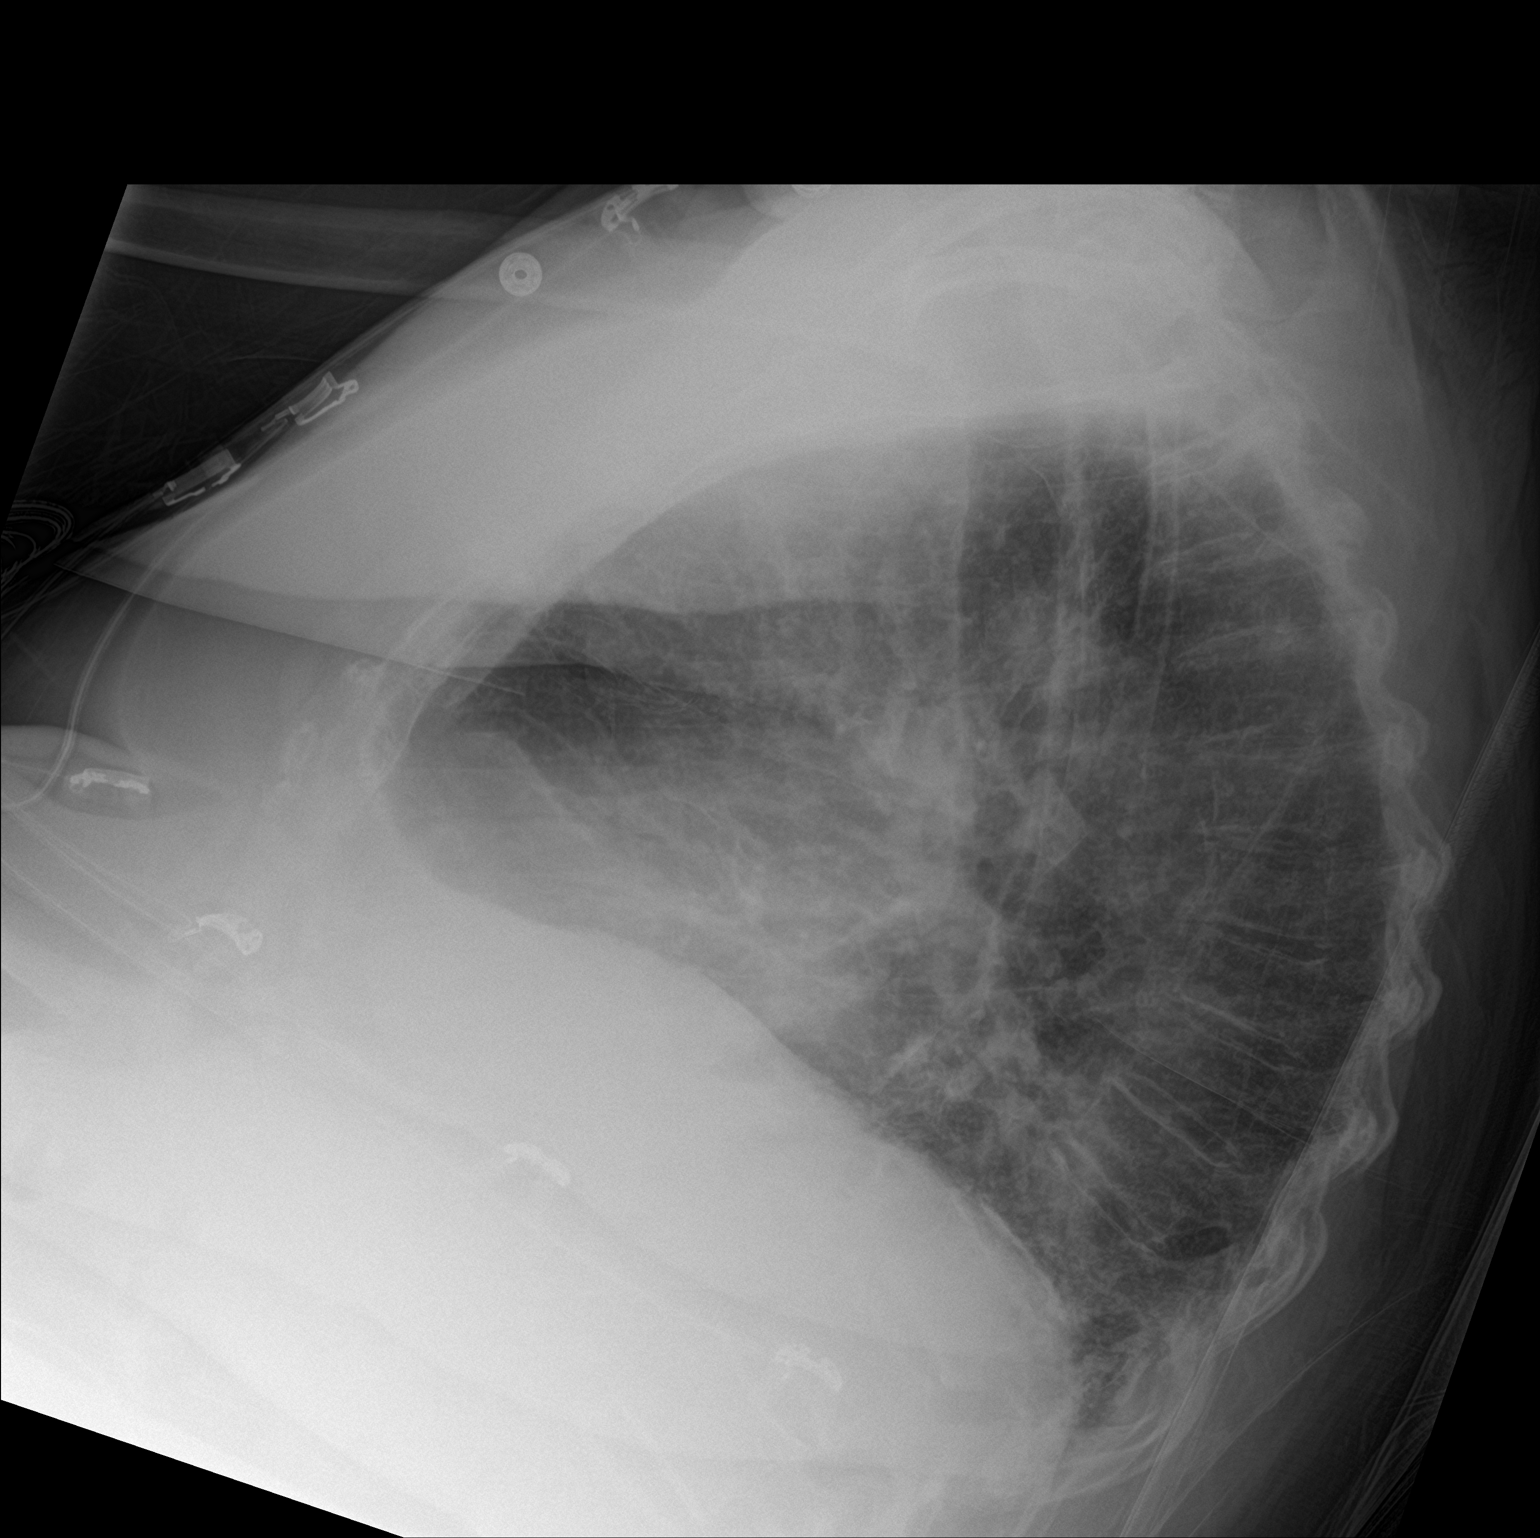

[chest ap]
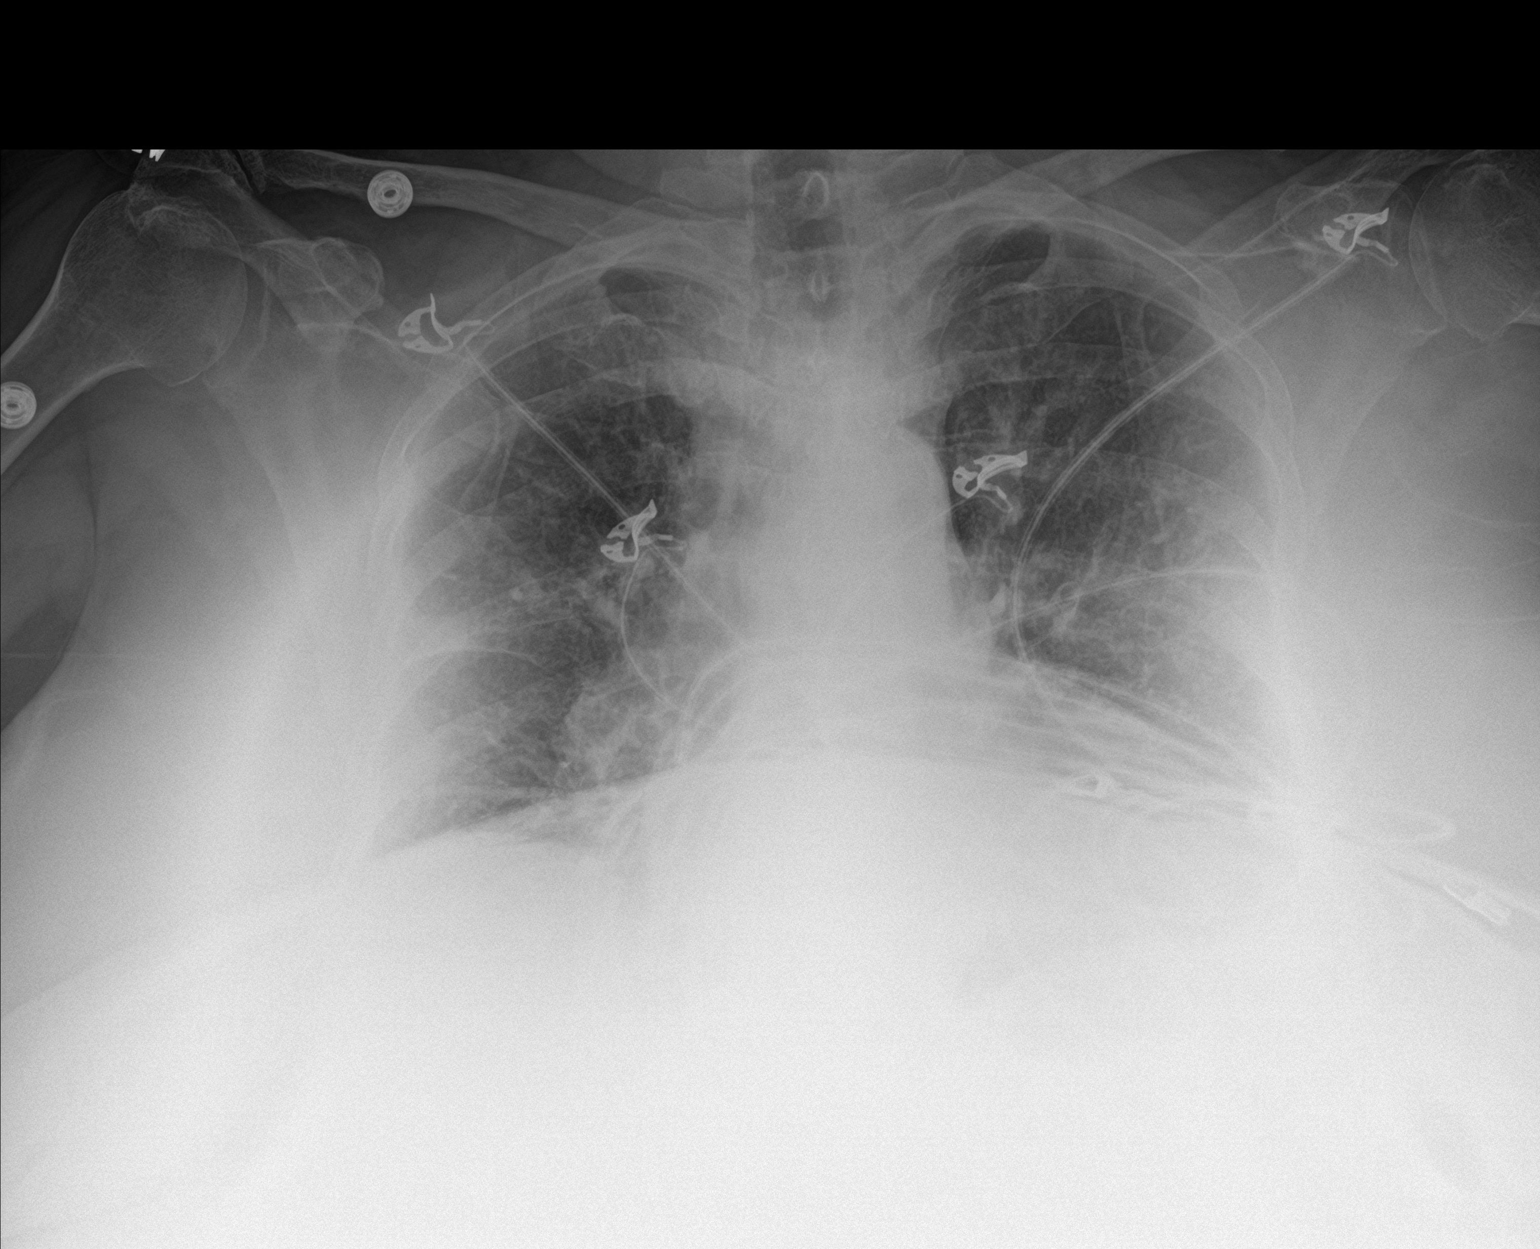

[2 of 2 positions shown; findings below may reference images not displayed]

FINDINGS: Very low lung volumes with bibasilar atelectasis. No overt airspace
consolidation. Component of mild interstitial edema cannot be
entirely excluded. No pleural effusions, nodules or pneumothorax
identified. The heart size is stable. The bony thorax shows stable
osteopenia and degenerative disease of the thoracic spine.
IMPRESSION: Low volumes with bibasilar atelectasis. Cannot exclude component of
mild pulmonary interstitial edema.

## 2019-11-23 DIAGNOSIS — H353221 Exudative age-related macular degeneration, left eye, with active choroidal neovascularization: Secondary | ICD-10-CM | POA: Diagnosis not present

## 2019-12-05 DIAGNOSIS — J302 Other seasonal allergic rhinitis: Secondary | ICD-10-CM | POA: Diagnosis not present

## 2019-12-05 DIAGNOSIS — J0100 Acute maxillary sinusitis, unspecified: Secondary | ICD-10-CM | POA: Diagnosis not present

## 2019-12-05 DIAGNOSIS — Z1152 Encounter for screening for COVID-19: Secondary | ICD-10-CM | POA: Diagnosis not present

## 2019-12-05 DIAGNOSIS — J209 Acute bronchitis, unspecified: Secondary | ICD-10-CM | POA: Diagnosis not present

## 2019-12-22 DIAGNOSIS — H353221 Exudative age-related macular degeneration, left eye, with active choroidal neovascularization: Secondary | ICD-10-CM | POA: Diagnosis not present

## 2019-12-22 DIAGNOSIS — H353111 Nonexudative age-related macular degeneration, right eye, early dry stage: Secondary | ICD-10-CM | POA: Diagnosis not present

## 2020-01-11 ENCOUNTER — Other Ambulatory Visit: Payer: Self-pay | Admitting: *Deleted

## 2020-01-11 MED ORDER — DUPIXENT 300 MG/2ML ~~LOC~~ SOSY: 300.0000 mg | PREFILLED_SYRINGE | SUBCUTANEOUS | 5 refills | Status: DC

## 2020-02-02 DIAGNOSIS — H353221 Exudative age-related macular degeneration, left eye, with active choroidal neovascularization: Secondary | ICD-10-CM | POA: Diagnosis not present

## 2020-02-06 DIAGNOSIS — R946 Abnormal results of thyroid function studies: Secondary | ICD-10-CM | POA: Diagnosis not present

## 2020-02-06 DIAGNOSIS — E7849 Other hyperlipidemia: Secondary | ICD-10-CM | POA: Diagnosis not present

## 2020-02-06 DIAGNOSIS — R7309 Other abnormal glucose: Secondary | ICD-10-CM | POA: Diagnosis not present

## 2020-02-13 DIAGNOSIS — R82998 Other abnormal findings in urine: Secondary | ICD-10-CM | POA: Diagnosis not present

## 2020-02-13 DIAGNOSIS — N1832 Chronic kidney disease, stage 3b: Secondary | ICD-10-CM | POA: Diagnosis not present

## 2020-02-13 DIAGNOSIS — Z0000 Encounter for general adult medical examination without abnormal findings: Secondary | ICD-10-CM | POA: Diagnosis not present

## 2020-02-13 DIAGNOSIS — I130 Hypertensive heart and chronic kidney disease with heart failure and stage 1 through stage 4 chronic kidney disease, or unspecified chronic kidney disease: Secondary | ICD-10-CM | POA: Diagnosis not present

## 2020-02-13 DIAGNOSIS — I509 Heart failure, unspecified: Secondary | ICD-10-CM | POA: Diagnosis not present

## 2020-02-13 DIAGNOSIS — Z1331 Encounter for screening for depression: Secondary | ICD-10-CM | POA: Diagnosis not present

## 2020-02-13 DIAGNOSIS — G459 Transient cerebral ischemic attack, unspecified: Secondary | ICD-10-CM | POA: Diagnosis not present

## 2020-02-13 DIAGNOSIS — M109 Gout, unspecified: Secondary | ICD-10-CM | POA: Diagnosis not present

## 2020-02-13 DIAGNOSIS — M1990 Unspecified osteoarthritis, unspecified site: Secondary | ICD-10-CM | POA: Diagnosis not present

## 2020-02-13 DIAGNOSIS — R7309 Other abnormal glucose: Secondary | ICD-10-CM | POA: Diagnosis not present

## 2020-02-13 DIAGNOSIS — R110 Nausea: Secondary | ICD-10-CM | POA: Diagnosis not present

## 2020-02-13 DIAGNOSIS — L209 Atopic dermatitis, unspecified: Secondary | ICD-10-CM | POA: Diagnosis not present

## 2020-02-28 ENCOUNTER — Other Ambulatory Visit (HOSPITAL_COMMUNITY): Payer: Self-pay | Admitting: Internal Medicine

## 2020-02-28 ENCOUNTER — Other Ambulatory Visit: Payer: Self-pay

## 2020-02-28 ENCOUNTER — Ambulatory Visit (HOSPITAL_COMMUNITY)
Admission: RE | Admit: 2020-02-28 | Discharge: 2020-02-28 | Disposition: A | Discharge status: Home / Self Care | Payer: Medicare Other | Source: Ambulatory Visit | Attending: Vascular Surgery | Admitting: Vascular Surgery

## 2020-02-28 DIAGNOSIS — I509 Heart failure, unspecified: Secondary | ICD-10-CM | POA: Diagnosis not present

## 2020-02-28 DIAGNOSIS — R600 Localized edema: Secondary | ICD-10-CM

## 2020-03-25 ENCOUNTER — Encounter: Payer: Self-pay | Admitting: *Deleted

## 2020-03-27 DIAGNOSIS — I509 Heart failure, unspecified: Secondary | ICD-10-CM | POA: Diagnosis not present

## 2020-03-27 DIAGNOSIS — N184 Chronic kidney disease, stage 4 (severe): Secondary | ICD-10-CM | POA: Diagnosis not present

## 2020-03-27 DIAGNOSIS — R600 Localized edema: Secondary | ICD-10-CM | POA: Diagnosis not present

## 2020-03-27 DIAGNOSIS — I130 Hypertensive heart and chronic kidney disease with heart failure and stage 1 through stage 4 chronic kidney disease, or unspecified chronic kidney disease: Secondary | ICD-10-CM | POA: Diagnosis not present

## 2020-04-02 ENCOUNTER — Ambulatory Visit: Payer: Medicare Other | Admitting: Dermatology

## 2020-05-02 DIAGNOSIS — Z23 Encounter for immunization: Secondary | ICD-10-CM | POA: Diagnosis not present

## 2020-05-24 DIAGNOSIS — H353221 Exudative age-related macular degeneration, left eye, with active choroidal neovascularization: Secondary | ICD-10-CM | POA: Diagnosis not present

## 2020-05-29 DIAGNOSIS — R600 Localized edema: Secondary | ICD-10-CM | POA: Diagnosis not present

## 2020-05-29 DIAGNOSIS — I509 Heart failure, unspecified: Secondary | ICD-10-CM | POA: Diagnosis not present

## 2020-05-29 DIAGNOSIS — N1832 Chronic kidney disease, stage 3b: Secondary | ICD-10-CM | POA: Diagnosis not present

## 2020-05-29 DIAGNOSIS — I130 Hypertensive heart and chronic kidney disease with heart failure and stage 1 through stage 4 chronic kidney disease, or unspecified chronic kidney disease: Secondary | ICD-10-CM | POA: Diagnosis not present

## 2020-05-29 DIAGNOSIS — I87329 Chronic venous hypertension (idiopathic) with inflammation of unspecified lower extremity: Secondary | ICD-10-CM | POA: Diagnosis not present

## 2020-06-10 DIAGNOSIS — N1832 Chronic kidney disease, stage 3b: Secondary | ICD-10-CM | POA: Diagnosis not present

## 2020-06-10 DIAGNOSIS — J9601 Acute respiratory failure with hypoxia: Secondary | ICD-10-CM | POA: Diagnosis not present

## 2020-06-10 DIAGNOSIS — I87329 Chronic venous hypertension (idiopathic) with inflammation of unspecified lower extremity: Secondary | ICD-10-CM | POA: Diagnosis not present

## 2020-06-10 DIAGNOSIS — R600 Localized edema: Secondary | ICD-10-CM | POA: Diagnosis not present

## 2020-06-10 DIAGNOSIS — L0390 Cellulitis, unspecified: Secondary | ICD-10-CM | POA: Diagnosis not present

## 2020-06-10 DIAGNOSIS — I130 Hypertensive heart and chronic kidney disease with heart failure and stage 1 through stage 4 chronic kidney disease, or unspecified chronic kidney disease: Secondary | ICD-10-CM | POA: Diagnosis not present

## 2020-06-11 DIAGNOSIS — Z23 Encounter for immunization: Secondary | ICD-10-CM | POA: Diagnosis not present

## 2020-06-24 ENCOUNTER — Telehealth: Payer: Self-pay | Admitting: Dermatology

## 2020-06-24 NOTE — Telephone Encounter (Signed)
Patient's daughter, Anya Murphey, is cakking to get advice on Dupixent.  Himani stopped Dupixent for a couple of months because her rash disappeared.  The rash came back and so the Dupixent was started 2 weeks ago.  Glorine only did one injection.  Hilda Blades wants to know if they should have done 2 injections? Brendalee is due for another injection today and should she do one or two injections?

## 2020-06-24 NOTE — Telephone Encounter (Signed)
Phone call to patients daughter Hilda Blades. Told her to just take the one shot today and just continue as normal patient is already feeling better after starting back with one shot.

## 2020-07-17 ENCOUNTER — Other Ambulatory Visit: Payer: Self-pay | Admitting: Dermatology

## 2020-07-22 ENCOUNTER — Telehealth: Payer: Self-pay | Admitting: *Deleted

## 2020-07-22 NOTE — Telephone Encounter (Signed)
Dupixent approved

## 2020-07-23 ENCOUNTER — Telehealth: Payer: Self-pay

## 2020-07-23 NOTE — Telephone Encounter (Signed)
Fax received from [place] stating that the patient's Dupixent is approved from 04/21/2020 through 07/20/2021.

## 2020-08-14 DIAGNOSIS — H353221 Exudative age-related macular degeneration, left eye, with active choroidal neovascularization: Secondary | ICD-10-CM | POA: Diagnosis not present

## 2020-08-16 DIAGNOSIS — I509 Heart failure, unspecified: Secondary | ICD-10-CM | POA: Diagnosis not present

## 2020-08-16 DIAGNOSIS — L0390 Cellulitis, unspecified: Secondary | ICD-10-CM | POA: Diagnosis not present

## 2020-08-16 DIAGNOSIS — N1831 Chronic kidney disease, stage 3a: Secondary | ICD-10-CM | POA: Diagnosis not present

## 2020-08-16 DIAGNOSIS — I130 Hypertensive heart and chronic kidney disease with heart failure and stage 1 through stage 4 chronic kidney disease, or unspecified chronic kidney disease: Secondary | ICD-10-CM | POA: Diagnosis not present

## 2020-08-16 DIAGNOSIS — E785 Hyperlipidemia, unspecified: Secondary | ICD-10-CM | POA: Diagnosis not present

## 2020-08-16 DIAGNOSIS — I87329 Chronic venous hypertension (idiopathic) with inflammation of unspecified lower extremity: Secondary | ICD-10-CM | POA: Diagnosis not present

## 2020-08-16 DIAGNOSIS — R7309 Other abnormal glucose: Secondary | ICD-10-CM | POA: Diagnosis not present

## 2020-08-16 DIAGNOSIS — K5900 Constipation, unspecified: Secondary | ICD-10-CM | POA: Diagnosis not present

## 2020-08-16 DIAGNOSIS — L309 Dermatitis, unspecified: Secondary | ICD-10-CM | POA: Diagnosis not present

## 2020-08-16 DIAGNOSIS — M109 Gout, unspecified: Secondary | ICD-10-CM | POA: Diagnosis not present

## 2020-08-29 ENCOUNTER — Other Ambulatory Visit: Payer: Self-pay | Admitting: Dermatology

## 2020-10-17 ENCOUNTER — Other Ambulatory Visit: Payer: Self-pay | Admitting: Dermatology

## 2020-11-13 DIAGNOSIS — H353221 Exudative age-related macular degeneration, left eye, with active choroidal neovascularization: Secondary | ICD-10-CM | POA: Diagnosis not present

## 2020-11-13 DIAGNOSIS — H353112 Nonexudative age-related macular degeneration, right eye, intermediate dry stage: Secondary | ICD-10-CM | POA: Diagnosis not present

## 2020-11-15 ENCOUNTER — Other Ambulatory Visit: Payer: Self-pay | Admitting: Dermatology

## 2020-11-19 ENCOUNTER — Encounter: Payer: Self-pay | Admitting: Dermatology

## 2020-11-19 ENCOUNTER — Other Ambulatory Visit: Payer: Self-pay

## 2020-11-19 ENCOUNTER — Ambulatory Visit (INDEPENDENT_AMBULATORY_CARE_PROVIDER_SITE_OTHER): Payer: Medicare Other | Admitting: Dermatology

## 2020-11-19 DIAGNOSIS — D0461 Carcinoma in situ of skin of right upper limb, including shoulder: Secondary | ICD-10-CM | POA: Diagnosis not present

## 2020-11-19 DIAGNOSIS — L2089 Other atopic dermatitis: Secondary | ICD-10-CM

## 2020-11-19 DIAGNOSIS — D0460 Carcinoma in situ of skin of unspecified upper limb, including shoulder: Secondary | ICD-10-CM

## 2020-11-19 DIAGNOSIS — D0462 Carcinoma in situ of skin of left upper limb, including shoulder: Secondary | ICD-10-CM | POA: Diagnosis not present

## 2020-11-19 DIAGNOSIS — D485 Neoplasm of uncertain behavior of skin: Secondary | ICD-10-CM

## 2020-11-19 NOTE — Patient Instructions (Signed)

## 2020-11-28 ENCOUNTER — Telehealth: Payer: Self-pay | Admitting: Dermatology

## 2020-11-28 DIAGNOSIS — L2089 Other atopic dermatitis: Secondary | ICD-10-CM

## 2020-11-28 MED ORDER — DUPIXENT 300 MG/2ML ~~LOC~~ SOSY: PREFILLED_SYRINGE | SUBCUTANEOUS | 6 refills | Status: DC

## 2020-11-28 NOTE — Telephone Encounter (Signed)
Phone call to patient to inform her that the information has been sent in for her [place].  Patient aware.

## 2020-11-28 NOTE — Telephone Encounter (Signed)
Specialty pharmacy told her they haven't gotten necessary paperwork to fill Dupixent Rx

## 2020-12-02 ENCOUNTER — Ambulatory Visit (INDEPENDENT_AMBULATORY_CARE_PROVIDER_SITE_OTHER): Payer: Medicare Other | Admitting: Dermatology

## 2020-12-02 ENCOUNTER — Other Ambulatory Visit: Payer: Self-pay

## 2020-12-02 ENCOUNTER — Telehealth: Payer: Self-pay | Admitting: Dermatology

## 2020-12-02 ENCOUNTER — Encounter: Payer: Self-pay | Admitting: Dermatology

## 2020-12-02 DIAGNOSIS — L080 Pyoderma: Secondary | ICD-10-CM | POA: Diagnosis not present

## 2020-12-02 DIAGNOSIS — C44722 Squamous cell carcinoma of skin of right lower limb, including hip: Secondary | ICD-10-CM

## 2020-12-02 MED ORDER — SULFAMETHOXAZOLE-TRIMETHOPRIM 800-160 MG PO TABS: ORAL_TABLET | ORAL | 0 refills | Status: DC

## 2020-12-02 NOTE — Telephone Encounter (Signed)
ERROR....IGNORE THIS

## 2020-12-02 NOTE — Patient Instructions (Signed)
Take one tablet in the morning and one tablet

## 2020-12-03 LAB — ANAEROBIC AND AEROBIC CULTURE: MICRO NUMBER:: 11671257

## 2020-12-05 LAB — ANAEROBIC AND AEROBIC CULTURE

## 2020-12-07 NOTE — Progress Notes (Signed)
Follow-Up Visit   Subjective  Brooke Lane is a 87 y.o. female who presents for the following: Follow-up (Patient here for Dupixent follow up. Per patient's daughter Hilda Blades patient is doing well on [place]. Check spot on right hand per patient's daughter no bleeding.).  Crusts on left arm and right hand Location:  Duration:  Quality:  Associated Signs/Symptoms: Modifying Factors:  Severity:  Timing: Context:   Objective  Well appearing patient in no apparent distress; mood and affect are within normal limits. Objective  Chest - Medial [facility]): Doing well on dupixent patient stated she was clear,  minor itch on back per Dr Denna Haggard may or may not be related to dermatitis   Objective  Left Elbow - Posterior: 2.3 cm waxy pink crust left elbow; tx with bx     Objective  Right 4th Finger Metacarpophalangeal Joint: Heaped up somewhat tender 2.2 cm pink crust on top of right hand 4th finger tx with bx       A focused examination was performed including Arms. Relevant physical exam findings are noted in the Assessment and Plan.   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, unspecified laterality (2) Left Elbow - Posterior  Skin / nail biopsy Type of biopsy: tangential   Informed consent: discussed and consent obtained   Timeout: patient name, date of birth, surgical site, and procedure verified   Procedure prep:  Patient was prepped and draped in usual sterile fashion (Non sterile) Prep type:  Chlorhexidine Anesthesia: the lesion was anesthetized in a standard fashion   Anesthetic:  1% lidocaine w/ epinephrine 1-100,000 local infiltration Instrument used: flexible razor blade   Outcome: patient tolerated procedure well   Post-procedure details: wound care instructions given    Destruction of  lesion Complexity: simple   Destruction method: electrodesiccation and curettage   Informed consent: discussed and consent obtained   Timeout:  patient name, date of birth, surgical site, and procedure verified Anesthesia: the lesion was anesthetized in a standard fashion   Anesthetic:  1% lidocaine w/ epinephrine 1-100,000 local infiltration Curettage performed in three different directions: Yes   Curettage cycles:  3 Lesion length (cm):  2.3 Lesion width (cm):  2.3 Margin per side (cm):  0 Final wound size (cm):  2.3 Hemostasis achieved with:  aluminum chloride Outcome: patient tolerated procedure well with no complications   Post-procedure details: wound care instructions given    Specimen 1 - Surgical pathology Differential Diagnosis: bcc vs scc Curet and cautery tx with bx  Check Margins: No  Right 4th Finger Metacarpophalangeal Joint  Skin / nail biopsy Type of biopsy: tangential   Informed consent: discussed and consent obtained   Timeout: patient name, date of birth, surgical site, and procedure verified   Anesthesia: the lesion was anesthetized in a standard fashion   Anesthetic:  1% lidocaine w/ epinephrine 1-100,000 local infiltration Instrument used: flexible razor blade   Hemostasis achieved with: ferric subsulfate   Outcome: patient tolerated procedure well   Post-procedure details: wound care instructions given    Destruction of lesion Complexity: simple   Destruction method: electrodesiccation and curettage   Informed consent: discussed and consent obtained   Timeout:  patient name, date of birth, surgical site, and procedure verified Anesthesia: the lesion was anesthetized in a standard fashion   Anesthetic:  1% lidocaine w/ epinephrine 1-100,000  local infiltration Curettage performed in three different directions: Yes   Curettage cycles:  3 Lesion length (cm):  2.1 Lesion width (cm):  2.1 Margin per side (cm):  0 Final wound size (cm):   2.1 Hemostasis achieved with:  aluminum chloride Outcome: patient tolerated procedure well with no complications   Post-procedure details: wound care instructions given    Specimen 2 - Surgical pathology Differential Diagnosis: bcc vs scc Curet and cautery tx with bx  Check Margins: No      I, Lavonna Monarch, MD, have reviewed all documentation for this visit.  The documentation on 12/07/20 for the exam, diagnosis, procedures, and orders are all accurate and complete.

## 2020-12-08 LAB — ANAEROBIC AND AEROBIC CULTURE
MICRO NUMBER:: 11671258
SPECIMEN QUALITY:: ADEQUATE
SPECIMEN QUALITY:: ADEQUATE

## 2020-12-11 ENCOUNTER — Telehealth: Payer: Self-pay | Admitting: Dermatology

## 2020-12-11 NOTE — Telephone Encounter (Signed)
FYI for ST: "It's looking 100% better!"

## 2020-12-13 NOTE — Progress Notes (Signed)
   Follow-Up Visit   Subjective  Brooke Lane is a 87 y.o. female who presents for the following: Wound Check (Biopsy done on right hand 11/19/2020- red and draining - tender to touch).  Wound check Location:  Duration:  Quality:  Associated Signs/Symptoms: Modifying Factors:  Severity:  Timing: Context:   Objective  Well appearing patient in no apparent distress; mood and affect are within normal limits. Objective  Right Hand - Posterior: Wound check, mucopurulent drainage.    A focused examination was performed including Right hand. Relevant physical exam findings are noted in the Assessment and Plan.   Assessment & Plan    Pyoderma (skin infection) Right 4th Finger Metacarpophalangeal Joint  sulfamethoxazole-trimethoprim (BACTRIM DS) 800-160 MG tablet - Right 4th Finger Metacarpophalangeal Joint  Anaerobic and Aerobic Culture - Right 4th Finger Metacarpophalangeal Joint  Squamous cell carcinoma of skin of right lower limb, including hip Right Hand - Posterior  Bacterial culture plus begin on twice daily generic Bactrim DS (side effects discussed).      I, Lavonna Monarch, MD, have reviewed all documentation for this visit.  The documentation on 12/13/20 for the exam, diagnosis, procedures, and orders are all accurate and complete.

## 2020-12-26 NOTE — Addendum Note (Signed)
Addended by: Lavonna Monarch on: 12/26/2020 07:14 AM   Modules accepted: Level of Service

## 2020-12-31 DIAGNOSIS — M25561 Pain in right knee: Secondary | ICD-10-CM | POA: Diagnosis not present

## 2020-12-31 DIAGNOSIS — M1711 Unilateral primary osteoarthritis, right knee: Secondary | ICD-10-CM | POA: Diagnosis not present

## 2021-01-23 DIAGNOSIS — R682 Dry mouth, unspecified: Secondary | ICD-10-CM | POA: Diagnosis not present

## 2021-02-03 DIAGNOSIS — Z1152 Encounter for screening for COVID-19: Secondary | ICD-10-CM | POA: Diagnosis not present

## 2021-02-03 DIAGNOSIS — I509 Heart failure, unspecified: Secondary | ICD-10-CM | POA: Diagnosis not present

## 2021-02-03 DIAGNOSIS — N1831 Chronic kidney disease, stage 3a: Secondary | ICD-10-CM | POA: Diagnosis not present

## 2021-02-03 DIAGNOSIS — I130 Hypertensive heart and chronic kidney disease with heart failure and stage 1 through stage 4 chronic kidney disease, or unspecified chronic kidney disease: Secondary | ICD-10-CM | POA: Diagnosis not present

## 2021-02-03 DIAGNOSIS — E785 Hyperlipidemia, unspecified: Secondary | ICD-10-CM | POA: Diagnosis not present

## 2021-02-03 DIAGNOSIS — R5383 Other fatigue: Secondary | ICD-10-CM | POA: Diagnosis not present

## 2021-02-03 DIAGNOSIS — R059 Cough, unspecified: Secondary | ICD-10-CM | POA: Diagnosis not present

## 2021-02-03 DIAGNOSIS — U071 COVID-19: Secondary | ICD-10-CM | POA: Diagnosis not present

## 2021-02-19 DIAGNOSIS — E785 Hyperlipidemia, unspecified: Secondary | ICD-10-CM | POA: Diagnosis not present

## 2021-02-19 DIAGNOSIS — Z0000 Encounter for general adult medical examination without abnormal findings: Secondary | ICD-10-CM | POA: Diagnosis not present

## 2021-02-19 DIAGNOSIS — H353221 Exudative age-related macular degeneration, left eye, with active choroidal neovascularization: Secondary | ICD-10-CM | POA: Diagnosis not present

## 2021-02-19 DIAGNOSIS — M109 Gout, unspecified: Secondary | ICD-10-CM | POA: Diagnosis not present

## 2021-02-19 DIAGNOSIS — R7309 Other abnormal glucose: Secondary | ICD-10-CM | POA: Diagnosis not present

## 2021-02-26 DIAGNOSIS — I130 Hypertensive heart and chronic kidney disease with heart failure and stage 1 through stage 4 chronic kidney disease, or unspecified chronic kidney disease: Secondary | ICD-10-CM | POA: Diagnosis not present

## 2021-02-26 DIAGNOSIS — R82998 Other abnormal findings in urine: Secondary | ICD-10-CM | POA: Diagnosis not present

## 2021-03-05 DIAGNOSIS — Z0000 Encounter for general adult medical examination without abnormal findings: Secondary | ICD-10-CM | POA: Diagnosis not present

## 2021-03-05 DIAGNOSIS — Z1331 Encounter for screening for depression: Secondary | ICD-10-CM | POA: Diagnosis not present

## 2021-03-05 DIAGNOSIS — E785 Hyperlipidemia, unspecified: Secondary | ICD-10-CM | POA: Diagnosis not present

## 2021-03-05 DIAGNOSIS — U071 COVID-19: Secondary | ICD-10-CM | POA: Diagnosis not present

## 2021-03-05 DIAGNOSIS — M1990 Unspecified osteoarthritis, unspecified site: Secondary | ICD-10-CM | POA: Diagnosis not present

## 2021-03-05 DIAGNOSIS — Z1389 Encounter for screening for other disorder: Secondary | ICD-10-CM | POA: Diagnosis not present

## 2021-03-05 DIAGNOSIS — I509 Heart failure, unspecified: Secondary | ICD-10-CM | POA: Diagnosis not present

## 2021-03-05 DIAGNOSIS — I10 Essential (primary) hypertension: Secondary | ICD-10-CM | POA: Diagnosis not present

## 2021-03-05 DIAGNOSIS — I130 Hypertensive heart and chronic kidney disease with heart failure and stage 1 through stage 4 chronic kidney disease, or unspecified chronic kidney disease: Secondary | ICD-10-CM | POA: Diagnosis not present

## 2021-03-05 DIAGNOSIS — N1832 Chronic kidney disease, stage 3b: Secondary | ICD-10-CM | POA: Diagnosis not present

## 2021-03-05 DIAGNOSIS — M109 Gout, unspecified: Secondary | ICD-10-CM | POA: Diagnosis not present

## 2021-03-05 DIAGNOSIS — R600 Localized edema: Secondary | ICD-10-CM | POA: Diagnosis not present

## 2021-05-02 ENCOUNTER — Other Ambulatory Visit: Payer: Self-pay | Admitting: Dermatology

## 2021-05-02 DIAGNOSIS — L2089 Other atopic dermatitis: Secondary | ICD-10-CM

## 2021-05-21 DIAGNOSIS — I87329 Chronic venous hypertension (idiopathic) with inflammation of unspecified lower extremity: Secondary | ICD-10-CM | POA: Diagnosis not present

## 2021-05-21 DIAGNOSIS — Z23 Encounter for immunization: Secondary | ICD-10-CM | POA: Diagnosis not present

## 2021-05-21 DIAGNOSIS — L97821 Non-pressure chronic ulcer of other part of left lower leg limited to breakdown of skin: Secondary | ICD-10-CM | POA: Diagnosis not present

## 2021-05-28 DIAGNOSIS — L97821 Non-pressure chronic ulcer of other part of left lower leg limited to breakdown of skin: Secondary | ICD-10-CM | POA: Diagnosis not present

## 2021-05-28 DIAGNOSIS — I87329 Chronic venous hypertension (idiopathic) with inflammation of unspecified lower extremity: Secondary | ICD-10-CM | POA: Diagnosis not present

## 2021-05-28 DIAGNOSIS — L97911 Non-pressure chronic ulcer of unspecified part of right lower leg limited to breakdown of skin: Secondary | ICD-10-CM | POA: Diagnosis not present

## 2021-06-04 DIAGNOSIS — H53001 Unspecified amblyopia, right eye: Secondary | ICD-10-CM | POA: Diagnosis not present

## 2021-06-04 DIAGNOSIS — H353221 Exudative age-related macular degeneration, left eye, with active choroidal neovascularization: Secondary | ICD-10-CM | POA: Diagnosis not present

## 2021-06-04 DIAGNOSIS — H353112 Nonexudative age-related macular degeneration, right eye, intermediate dry stage: Secondary | ICD-10-CM | POA: Diagnosis not present

## 2021-06-18 ENCOUNTER — Other Ambulatory Visit: Payer: Self-pay | Admitting: Dermatology

## 2021-06-18 DIAGNOSIS — L2089 Other atopic dermatitis: Secondary | ICD-10-CM

## 2021-06-25 DIAGNOSIS — L97821 Non-pressure chronic ulcer of other part of left lower leg limited to breakdown of skin: Secondary | ICD-10-CM | POA: Diagnosis not present

## 2021-06-25 DIAGNOSIS — L0390 Cellulitis, unspecified: Secondary | ICD-10-CM | POA: Diagnosis not present

## 2021-06-25 DIAGNOSIS — I83028 Varicose veins of left lower extremity with ulcer other part of lower leg: Secondary | ICD-10-CM | POA: Diagnosis not present

## 2021-07-09 DIAGNOSIS — E1169 Type 2 diabetes mellitus with other specified complication: Secondary | ICD-10-CM | POA: Diagnosis not present

## 2021-07-09 DIAGNOSIS — M1990 Unspecified osteoarthritis, unspecified site: Secondary | ICD-10-CM | POA: Diagnosis not present

## 2021-07-09 DIAGNOSIS — R531 Weakness: Secondary | ICD-10-CM | POA: Diagnosis not present

## 2021-07-09 DIAGNOSIS — N1832 Chronic kidney disease, stage 3b: Secondary | ICD-10-CM | POA: Diagnosis not present

## 2021-07-09 DIAGNOSIS — I10 Essential (primary) hypertension: Secondary | ICD-10-CM | POA: Diagnosis not present

## 2021-07-09 DIAGNOSIS — L97821 Non-pressure chronic ulcer of other part of left lower leg limited to breakdown of skin: Secondary | ICD-10-CM | POA: Diagnosis not present

## 2021-07-09 DIAGNOSIS — I83028 Varicose veins of left lower extremity with ulcer other part of lower leg: Secondary | ICD-10-CM | POA: Diagnosis not present

## 2021-07-09 DIAGNOSIS — L0390 Cellulitis, unspecified: Secondary | ICD-10-CM | POA: Diagnosis not present

## 2021-09-17 DIAGNOSIS — H353221 Exudative age-related macular degeneration, left eye, with active choroidal neovascularization: Secondary | ICD-10-CM | POA: Diagnosis not present

## 2021-09-17 DIAGNOSIS — H353112 Nonexudative age-related macular degeneration, right eye, intermediate dry stage: Secondary | ICD-10-CM | POA: Diagnosis not present

## 2021-09-17 DIAGNOSIS — H53001 Unspecified amblyopia, right eye: Secondary | ICD-10-CM | POA: Diagnosis not present

## 2021-10-28 DIAGNOSIS — H0100A Unspecified blepharitis right eye, upper and lower eyelids: Secondary | ICD-10-CM | POA: Diagnosis not present

## 2021-10-28 DIAGNOSIS — H353112 Nonexudative age-related macular degeneration, right eye, intermediate dry stage: Secondary | ICD-10-CM | POA: Diagnosis not present

## 2021-10-28 DIAGNOSIS — H0100B Unspecified blepharitis left eye, upper and lower eyelids: Secondary | ICD-10-CM | POA: Diagnosis not present

## 2021-10-28 DIAGNOSIS — H353221 Exudative age-related macular degeneration, left eye, with active choroidal neovascularization: Secondary | ICD-10-CM | POA: Diagnosis not present

## 2021-11-06 ENCOUNTER — Other Ambulatory Visit: Payer: Self-pay | Admitting: Dermatology

## 2021-11-06 DIAGNOSIS — L2089 Other atopic dermatitis: Secondary | ICD-10-CM

## 2021-12-03 DIAGNOSIS — I87329 Chronic venous hypertension (idiopathic) with inflammation of unspecified lower extremity: Secondary | ICD-10-CM | POA: Diagnosis not present

## 2021-12-03 DIAGNOSIS — I509 Heart failure, unspecified: Secondary | ICD-10-CM | POA: Diagnosis not present

## 2021-12-03 DIAGNOSIS — I83028 Varicose veins of left lower extremity with ulcer other part of lower leg: Secondary | ICD-10-CM | POA: Diagnosis not present

## 2021-12-03 DIAGNOSIS — L03115 Cellulitis of right lower limb: Secondary | ICD-10-CM | POA: Diagnosis not present

## 2021-12-03 DIAGNOSIS — L97821 Non-pressure chronic ulcer of other part of left lower leg limited to breakdown of skin: Secondary | ICD-10-CM | POA: Diagnosis not present

## 2021-12-24 DIAGNOSIS — H353221 Exudative age-related macular degeneration, left eye, with active choroidal neovascularization: Secondary | ICD-10-CM | POA: Diagnosis not present

## 2022-02-27 ENCOUNTER — Encounter: Payer: Self-pay | Admitting: Specialist

## 2022-02-27 DIAGNOSIS — M109 Gout, unspecified: Secondary | ICD-10-CM | POA: Diagnosis not present

## 2022-02-27 DIAGNOSIS — I10 Essential (primary) hypertension: Secondary | ICD-10-CM | POA: Diagnosis not present

## 2022-02-27 DIAGNOSIS — R5383 Other fatigue: Secondary | ICD-10-CM | POA: Diagnosis not present

## 2022-02-27 DIAGNOSIS — E785 Hyperlipidemia, unspecified: Secondary | ICD-10-CM | POA: Diagnosis not present

## 2022-02-27 DIAGNOSIS — R7309 Other abnormal glucose: Secondary | ICD-10-CM | POA: Diagnosis not present

## 2022-02-27 DIAGNOSIS — R7989 Other specified abnormal findings of blood chemistry: Secondary | ICD-10-CM | POA: Diagnosis not present

## 2022-02-27 DIAGNOSIS — Z79899 Other long term (current) drug therapy: Secondary | ICD-10-CM | POA: Diagnosis not present

## 2022-03-05 DIAGNOSIS — I509 Heart failure, unspecified: Secondary | ICD-10-CM | POA: Diagnosis not present

## 2022-03-05 DIAGNOSIS — R5383 Other fatigue: Secondary | ICD-10-CM | POA: Diagnosis not present

## 2022-03-11 ENCOUNTER — Other Ambulatory Visit (HOSPITAL_COMMUNITY): Payer: Self-pay | Admitting: Internal Medicine

## 2022-03-11 DIAGNOSIS — R0602 Shortness of breath: Secondary | ICD-10-CM

## 2022-03-11 DIAGNOSIS — Z0000 Encounter for general adult medical examination without abnormal findings: Secondary | ICD-10-CM | POA: Diagnosis not present

## 2022-03-11 DIAGNOSIS — N1832 Chronic kidney disease, stage 3b: Secondary | ICD-10-CM | POA: Diagnosis not present

## 2022-03-11 DIAGNOSIS — E1169 Type 2 diabetes mellitus with other specified complication: Secondary | ICD-10-CM | POA: Diagnosis not present

## 2022-03-11 DIAGNOSIS — M1990 Unspecified osteoarthritis, unspecified site: Secondary | ICD-10-CM | POA: Diagnosis not present

## 2022-03-11 DIAGNOSIS — Z1339 Encounter for screening examination for other mental health and behavioral disorders: Secondary | ICD-10-CM | POA: Diagnosis not present

## 2022-03-11 DIAGNOSIS — I83028 Varicose veins of left lower extremity with ulcer other part of lower leg: Secondary | ICD-10-CM | POA: Diagnosis not present

## 2022-03-11 DIAGNOSIS — L97821 Non-pressure chronic ulcer of other part of left lower leg limited to breakdown of skin: Secondary | ICD-10-CM | POA: Diagnosis not present

## 2022-03-11 DIAGNOSIS — L0390 Cellulitis, unspecified: Secondary | ICD-10-CM | POA: Diagnosis not present

## 2022-03-11 DIAGNOSIS — I130 Hypertensive heart and chronic kidney disease with heart failure and stage 1 through stage 4 chronic kidney disease, or unspecified chronic kidney disease: Secondary | ICD-10-CM | POA: Diagnosis not present

## 2022-03-11 DIAGNOSIS — Z1331 Encounter for screening for depression: Secondary | ICD-10-CM | POA: Diagnosis not present

## 2022-03-11 DIAGNOSIS — I509 Heart failure, unspecified: Secondary | ICD-10-CM | POA: Diagnosis not present

## 2022-03-11 DIAGNOSIS — R531 Weakness: Secondary | ICD-10-CM | POA: Diagnosis not present

## 2022-03-13 ENCOUNTER — Ambulatory Visit (HOSPITAL_COMMUNITY): Payer: Medicare Other | Attending: Cardiology

## 2022-03-13 DIAGNOSIS — R0602 Shortness of breath: Secondary | ICD-10-CM | POA: Insufficient documentation

## 2022-03-13 DIAGNOSIS — I509 Heart failure, unspecified: Secondary | ICD-10-CM | POA: Diagnosis not present

## 2022-03-13 LAB — ECHOCARDIOGRAM COMPLETE
Area-P 1/2: 3.72 cm2
S' Lateral: 3.2 cm

## 2022-03-18 NOTE — Progress Notes (Unsigned)
Cardiology Office Note   Date:  03/19/2022   ID:  Brooke Lane, DOB 05-06-1925, MRN [id number]  PCP:  [facility]  Cardiologist:   Minus Breeding, MD Referring:  Velna Hatchet, MD  Chief Complaint  Patient presents with   Shortness of Breath      History of Present Illness: Brooke Lane is a 88 y.o. female who presents for evaluation of SOB and a significantly elevated BNP.  She had an echo on 6/30 with an EF of 60 - 65%.  There is mild diastolic dysfunction.    There is moderate RV dysfunction.      The patient lives by herself.  She has 2 attentive daughters.  She has had longstanding lower extremity swelling.  She is chronically slept in a chair because of her back.  She has had increased dyspnea with oxygen saturations that she says are in the 90% range when she is seated and she uses a home pulse oximeter.  However, today she was hypoxemic in the 80s on room air.  She does wear compression stockings.  She has had some lower extremity wounds managed apparently venous stasis.  She did have a markedly elevated BNP the other day at 4227.  She is referred here for this.  She gets around slowly with a walker.  She does feel short of breath when she ambulates.  She says if she sits down she recovers fairly quickly.  Sleeping in the chair she is not describing any acute shortness of breath, PND or orthopnea.  She says her legs are less swollen when she wakes up.  She was given an increased dose of Lasix 80 mg instead of 40 mg for 3 days.  However, she has renal insufficiency and her primary has been cautious about this.  She has had some confusion on how much fluid to drink because of her renal insufficiency she is told to drink significant mount of fluid.  She probably does watch her salt reasonably well.   Past Medical History:  Diagnosis Date   Hiatal hernia    High cholesterol    Hypertension    Pneumonia    SCC (squamous cell carcinoma) 08/24/2003   left shin  superior (cx47f) ([facility])   SCC (squamous cell carcinoma) 08/24/2003   left shin inferior (cx346f ([facility])    SCC (squamous cell carcinoma) 03/03/2011   left elbow ([facility])    SCC (squamous cell carcinoma) 03/03/2011   left bicep (insitu)    SCC (squamous cell carcinoma) 2/10/20202   left outer cheek (cx3559f([facility])   Squamous cell carcinoma of skin 04/04/1998   Left lower post leg (cx35f5f [facility])   [facility]  Past Surgical History:  Procedure Laterality Date   BREAST LUMPECTOMY     TONSILLECTOMY       Current Outpatient Medications  Medication Sig Dispense Refill   allopurinol (ZYLOPRIM) 300 MG tablet Take 1 tablet by mouth daily.     clopidogrel (PLAVIX) 75 MG tablet Take 75 mg by mouth daily.     colchicine 0.6 MG tablet Take 0.3 mg by mouth daily as needed.     DUPIXENT 300 MG/2ML prefilled syringe INJECT '300MG'$  (1 SYRINGE) SUBCUTANEOUSLY EVERY 2 WEEKS AS DIRECTED. 2 mL 10   furosemide (LASIX) 40 MG tablet Take 1 tablet (40 mg total) by mouth daily. 30 tablet 0   gemfibrozil (LOPID) 600 MG tablet Take 600 mg by mouth 2 (two) times daily before a meal.  nebivolol (BYSTOLIC) 10 MG tablet Take 1 tablet by mouth daily.     amLODipine (NORVASC) 5 MG tablet Take 1 tablet (5 mg total) by mouth daily. (Patient not taking: Reported on 03/19/2022) 30 tablet 0   dextromethorphan-guaiFENesin (MUCINEX DM) 30-600 MG 12hr tablet Take 1 tablet by mouth 2 (two) times daily as needed for cough. (Patient not taking: Reported on 03/19/2022)     diphenhydrAMINE (BENADRYL) 25 mg capsule Take 25 mg by mouth every 6 (six) hours as needed for allergies. (Patient not taking: Reported on 03/19/2022)     docusate sodium (COLACE) 100 MG capsule Take 1 capsule (100 mg total) by mouth 2 (two) times daily. (Patient not taking: Reported on 03/19/2022) 10 capsule 0   doxycycline (VIBRAMYCIN) 100 MG capsule Take 1 capsule (100 mg total) by mouth 2 (two) times daily. (Patient not taking: Reported on 03/19/2022) 20  capsule 0   linaclotide (LINZESS) 72 MCG capsule Take 1 tablet by mouth daily. (Patient not taking: Reported on 03/19/2022)     sodium chloride (OCEAN) 0.65 % SOLN nasal spray Place 1 spray into both nostrils as needed for congestion. (Patient not taking: Reported on 03/19/2022)     sulfamethoxazole-trimethoprim (BACTRIM DS) 800-160 MG tablet Take one po bid x 10 days (Patient not taking: Reported on 03/19/2022) 20 tablet 0   No current facility-administered medications for this visit.    Allergies:   Patient has no known allergies.    Social History:  The patient  reports that she has never smoked. She has never used smokeless tobacco. She reports that she does not drink alcohol and does not use drugs.   Family History:  The patient's family history is not on file.    ROS:  Please see the history of present illness.   Otherwise, review of systems are positive for none.   All other systems are reviewed and negative.    PHYSICAL EXAM: VS:  BP 116/60 (BP Location: Left Arm, Patient Position: Sitting, Cuff Size: Large)   Pulse (!) 56   Ht '5\' 6"'$  (1.676 m)   Wt 261 lb 3.2 oz (118.5 kg)   SpO2 92%   BMI 42.16 kg/m  , BMI Body mass index is 42.16 kg/m. GEN:  No distress NECK:  No jugular venous distention at 90 degrees, waveform within normal limits, carotid upstroke brisk and symmetric, no bruits, no thyromegaly LYMPHATICS:  No cervical adenopathy LUNGS: Bilateral diffuse crackles one third of the way up BACK:  No CVA tenderness CHEST:  Unremarkable HEART:  S1 and S2 within normal limits, no S3, no S4, no clicks, no rubs, no murmurs ABD:  Positive bowel sounds normal in frequency in pitch, no bruits, no rebound, no guarding, unable to assess midline mass or bruit with the patient seated. EXT:  2 plus pulses throughout, moderate edema, no cyanosis no clubbing SKIN:  No rashes no nodules NEURO:  Cranial nerves II through XII grossly intact, motor grossly intact throughout PSYCH:  Cognitively  intact, oriented to person place and time   EKG:  EKG is ordered today. The ekg ordered today demonstrates sinus rhythm, rate 56, right bundle branch block, left anterior fascicular block, no acute ST-T wave changes.  SATURATION QUALIFICATIONS: (This note is used to comply with regulatory documentation for home oxygen)  Patient Saturations on Room Air at Rest = 92%  Patient Saturations on Room Air while Ambulating = 82%  Patient Saturations on 2 Liters of oxygen while Ambulating = 93%  Please briefly explain  why patient needs home oxygen: Because of hypoxemia  Recent Labs: No results found for requested labs within last 365 days.    Lipid Panel No results found for: "CHOL", "TRIG", "HDL", "CHOLHDL", "VLDL", "LDLCALC", "LDLDIRECT"    Wt Readings from Last 3 Encounters:  03/19/22 261 lb 3.2 oz (118.5 kg)  08/12/18 274 lb (124.3 kg)  08/30/17 280 lb (127 kg)      Other studies Reviewed: Additional studies/ records that were reviewed today include: Primary care office records and labs. Review of the above records demonstrates:  Please see elsewhere in the note.  See elsewhere   ASSESSMENT AND PLAN:  Acute diastolic heart failure: The patient has acute diastolic heart failure.  I agree that she needs to increase Lasix.  I am going to have her take the 80 mg of Lasix for 5 more days.  She will get a basic metabolic profile today and we need to see her back and get a basic metabolic profile again mid next week.  We talked about salt and fluid restriction.  We talked about keeping her feet elevated and the compression stockings.  I would like to get a chest x-ray.  She qualifies for home oxygen as above.  HTN: Her blood pressure is controlled.  She will continue the meds as listed.  CKD IIIb: I will be following this closely with a basic metabolic profile today and again in 5 days only see her back.   Current medicines are reviewed at length with the patient today.  The patient  does not have concerns regarding medicines.  The following changes have been made: As above  Labs/ tests ordered today include: None  Orders Placed This Encounter  Procedures   DG Chest 2 View   Basic Metabolic Panel (BMET)   EKG 12-Lead     Disposition:   FU with me or APP in one week.      Signed, Minus Breeding, MD  03/19/2022 3:41 PM    [facility]

## 2022-03-19 ENCOUNTER — Ambulatory Visit (INDEPENDENT_AMBULATORY_CARE_PROVIDER_SITE_OTHER): Payer: Medicare Other | Admitting: Cardiology

## 2022-03-19 ENCOUNTER — Encounter: Payer: Self-pay | Admitting: Cardiology

## 2022-03-19 ENCOUNTER — Telehealth: Payer: Self-pay | Admitting: *Deleted

## 2022-03-19 ENCOUNTER — Ambulatory Visit
Admission: RE | Admit: 2022-03-19 | Discharge: 2022-03-19 | Disposition: A | Discharge status: Home / Self Care | Payer: Medicare Other | Source: Ambulatory Visit | Attending: Cardiology | Admitting: Cardiology

## 2022-03-19 VITALS — BP 116/60 | HR 56 | Ht 66.0 in | Wt 261.2 lb

## 2022-03-19 DIAGNOSIS — R0602 Shortness of breath: Secondary | ICD-10-CM

## 2022-03-19 DIAGNOSIS — R0902 Hypoxemia: Secondary | ICD-10-CM

## 2022-03-19 DIAGNOSIS — R918 Other nonspecific abnormal finding of lung field: Secondary | ICD-10-CM | POA: Diagnosis not present

## 2022-03-19 NOTE — Telephone Encounter (Signed)
Message has been sent to [place] to get home oxygen set up.    SATURATION QUALIFICATIONS: (This note is used to comply with regulatory documentation for home oxygen)  Patient Saturations on Room Air at Rest = 92%  Patient Saturations on Room Air while Ambulating = 82%  Patient Saturations on 2 Liters of oxygen while Ambulating = 93%  Please briefly explain why patient needs home oxygen: Patient has hypoxemia.

## 2022-03-19 NOTE — Patient Instructions (Addendum)
Medication Instructions:  No changes  *If you need a refill on your cardiac medications before your next appointment, please call your pharmacy*   Lab Work: Your provider would like for you to have the following labs today: BMET  If you have labs (blood work) drawn today and your tests are completely normal, you will receive your results only by: [place] (if you have MyChart) OR A paper copy in the mail If you have any lab test that is abnormal or we need to change your treatment, we will call you to review the results.   Testing/Procedures: A chest x-ray takes a picture of the organs and structures inside the chest, including the heart, lungs, and blood vessels. This test can show several things, including, whether the heart is enlarges; whether fluid is building up in the lungs; and whether pacemaker / defibrillator leads are still in place.  This can be done at [place]. You do not need an appointment.  [address]   Follow-Up: At [facility], you and your health needs are our priority.  As part of our continuing mission to provide you with exceptional heart care, we have created designated Provider Care Teams.  These Care Teams include your primary Cardiologist (physician) and Advanced Practice Providers (APPs -  Physician Assistants and Nurse Practitioners) who all work together to provide you with the care you need, when you need it.  We recommend signing up for the patient portal called "MyChart".  Sign up information is provided on this After Visit Summary.  MyChart is used to connect with patients for Virtual Visits (Telemedicine).  Patients are able to view lab/test results, encounter notes, upcoming appointments, etc.  Non-urgent messages can be sent to your provider as well.   To learn more about what you can do with MyChart, go to [email or web address].    Your next appointment:   1 week(s)  The format for your next appointment:   In  Person  Provider:  Dr. Percival Spanish or APP  Other Instructions We will place an order for Home Oxygen. They will call you to set this up.  Important Information About Sugar

## 2022-03-20 ENCOUNTER — Other Ambulatory Visit: Payer: Self-pay | Admitting: *Deleted

## 2022-03-20 DIAGNOSIS — I509 Heart failure, unspecified: Secondary | ICD-10-CM

## 2022-03-20 LAB — BASIC METABOLIC PANEL
BUN/Creatinine Ratio: 21 (ref 12–28)
BUN: 32 mg/dL (ref 10–36)
CO2: 27 mmol/L (ref 20–29)
Calcium: 9.4 mg/dL (ref 8.7–10.3)
Chloride: 99 mmol/L (ref 96–106)
Creatinine, Ser: 1.55 mg/dL — ABNORMAL HIGH (ref 0.57–1.00)
Glucose: 81 mg/dL (ref 70–99)
Potassium: 5.3 mmol/L — ABNORMAL HIGH (ref 3.5–5.2)
Sodium: 142 mmol/L (ref 134–144)
eGFR: 31 mL/min/{1.73_m2} — ABNORMAL LOW (ref >59)

## 2022-03-20 NOTE — Telephone Encounter (Signed)
Patient is due to receive oxygen today per staff message

## 2022-03-24 ENCOUNTER — Other Ambulatory Visit: Payer: Self-pay | Admitting: *Deleted

## 2022-03-24 NOTE — Progress Notes (Signed)
Cardiology Clinic Note   Patient Name: Brooke Lane Date of Encounter: 03/27/2022  Primary Care Provider:  [facility] Primary Cardiologist:  Minus Breeding, MD  Patient Profile    Brooke Lane 88 year old female presents to clinic today for follow-up evaluation of her acute diastolic CHF.  Past Medical History    Past Medical History:  Diagnosis Date   Hiatal hernia    High cholesterol    Hypertension    Pneumonia    SCC (squamous cell carcinoma) 08/24/2003   left shin superior (cx59f) ([facility])   SCC (squamous cell carcinoma) 08/24/2003   left shin inferior (cx377f ([facility])    SCC (squamous cell carcinoma) 03/03/2011   left elbow ([facility])    SCC (squamous cell carcinoma) 03/03/2011   left bicep (insitu)    SCC (squamous cell carcinoma) 2/10/20202   left outer cheek (cx3533f([facility])   Squamous cell carcinoma of skin 04/04/1998   Left lower post leg (cx35f20f [facility]IS)   Stroke (HCC)[place] Past Surgical History:  Procedure Laterality Date   BREAST LUMPECTOMY     TONSILLECTOMY      Allergies  No Known Allergies  History of Present Illness    Brooke Lane a PMH of HTN, acute diastolic CHF, community-acquired pneumonia, acute respiratory failure, chronic constipation, acute metabolic encephalopathy, hyperlipidemia, and acute on chronic altered mental status.  She was noted to have an elevation in her BNP.  Her echocardiogram 630 showed an EF of 60 to 65% with mild diastolic dysfunction.  She was noted to have moderate RV dysfunction.  She was seen by Dr. HochPercival Spanish7/02/2022.  She continues to live by herself.  She has 2 daughters that help care for her.  She has chronic lower extremity swelling.  She has chronically slept in a recliner due to back discomfort.  She is noted to have increased dyspnea with oxygen saturations that are in the 90 range when she is seated at home via pulse oximeter.  In the clinic on evaluation she was noted to be hypoxic  in the 80s while on room air.  She wears compression stockings.  Her BNP was noted to be 4227 and she was referred for evaluation and management.  She reported shortness of breath with ambulation.  She uses a walker with ambulation.  She felt that with rest she recovered fairly quickly.  She did not described shortness of breath, PND, or orthopnea.  She indicated that her legs are less swollen when she would wake up in the morning.  She was taking furosemide 80 mg daily instead of 40 mg for 3 days.  Her primary care doctor noted she has renal insufficiency and was conscious of this with diuresis.  She did note some confusion on how much fluid she should drink due to her renal insufficiency.  She was told to drink a increased amount of fluid.  It was felt that she had some dietary indiscretion.  Her furosemide was maintained to 80 mg for another 5 days.  She presents to the clinic today for follow-up evaluation states she feels much better after her increased furosemide.  Her weight today is 255 pounds which is down from 261 pounds.  She does note some increased work of breathing with increased physical activity but no increased work of breathing at rest.  We reviewed the importance of low-sodium diet, fluid restriction, lower extremity support stockings.  She expressed understanding.  She presents with her daughter who reports  that she has been consuming an increased amount of tomatoes from her garden.  She reports that oxygen has been installed at their house.  However, she needs a personal oxygen to carry around for her errands and doctors appointments.  I will send a note to Dr. Percival Spanish to facilitate personal oxygen.  I will give a weight log, continue current medication regimen, order BMP and plan follow-up in 3 to 4 months.  Today she denies chest pain, shortness of breath, lower extremity edema, fatigue, palpitations, melena, hematuria, hemoptysis, diaphoresis, weakness, presyncope, syncope, orthopnea,  and PND.    Home Medications    Prior to Admission medications   Medication Sig Start Date End Date Taking? Authorizing Provider  allopurinol (ZYLOPRIM) 300 MG tablet Take 1 tablet by mouth daily. 12/03/20   [provider]  amLODipine (NORVASC) 5 MG tablet Take 1 tablet (5 mg total) by mouth daily. Patient not taking: Reported on 03/19/2022 12/10/16   Hosie Poisson, MD  clopidogrel (PLAVIX) 75 MG tablet Take 75 mg by mouth daily.    [provider]  colchicine 0.6 MG tablet Take 0.3 mg by mouth daily as needed. 02/26/22   [provider]  dextromethorphan-guaiFENesin (MUCINEX DM) 30-600 MG 12hr tablet Take 1 tablet by mouth 2 (two) times daily as needed for cough. Patient not taking: Reported on 03/19/2022    [provider]  diphenhydrAMINE (BENADRYL) 25 mg capsule Take 25 mg by mouth every 6 (six) hours as needed for allergies. Patient not taking: Reported on 03/19/2022    [provider]  docusate sodium (COLACE) 100 MG capsule Take 1 capsule (100 mg total) by mouth 2 (two) times daily. Patient not taking: Reported on 03/19/2022 12/09/16   Hosie Poisson, MD  doxycycline (VIBRAMYCIN) 100 MG capsule Take 1 capsule (100 mg total) by mouth 2 (two) times daily. Patient not taking: Reported on 03/19/2022 08/12/18   Nat Christen, MD  DUPIXENT 300 MG/2ML prefilled syringe INJECT '300MG'$  (1 SYRINGE) SUBCUTANEOUSLY EVERY 2 WEEKS AS DIRECTED. 11/06/21   Lavonna Monarch, MD  furosemide (LASIX) 40 MG tablet Take 1 tablet (40 mg total) by mouth daily. 12/10/16   Hosie Poisson, MD  gemfibrozil (LOPID) 600 MG tablet Take 600 mg by mouth 2 (two) times daily before a meal.    [provider]  linaclotide (LINZESS) 72 MCG capsule Take 1 tablet by mouth daily. Patient not taking: Reported on 03/19/2022    [provider]  nebivolol (BYSTOLIC) 10 MG tablet Take 1 tablet by mouth daily. 01/02/21   [provider]  sodium chloride (OCEAN) 0.65 % SOLN nasal  spray Place 1 spray into both nostrils as needed for congestion. Patient not taking: Reported on 03/19/2022    [provider]  sulfamethoxazole-trimethoprim (BACTRIM DS) 800-160 MG tablet Take one po bid x 10 days Patient not taking: Reported on 03/19/2022 12/02/20   Lavonna Monarch, MD    Family History    History reviewed. No pertinent family history. has no family status information on file.   Social History    Social History   Socioeconomic History   Marital status: Widowed    Spouse name: Not on file   Number of children: Not on file   Years of education: Not on file   Highest education level: Not on file  Occupational History   Not on file  Tobacco Use   Smoking status: Never   Smokeless tobacco: Never  Substance and Sexual Activity   Alcohol use: No  Alcohol/week: 0.0 standard drinks of alcohol   Drug use: No   Sexual activity: Not on file  Other Topics Concern   Not on file  Social History Narrative   Not on file   Social Determinants of Health   Financial Resource Strain: Not on file  Food Insecurity: Not on file  Transportation Needs: Not on file  Physical Activity: Not on file  Stress: Not on file  Social Connections: Not on file  Intimate Partner Violence: Not on file     Review of Systems    General:  No chills, fever, night sweats or weight changes.  Cardiovascular:  No chest pain, dyspnea on exertion, edema, orthopnea, palpitations, paroxysmal nocturnal dyspnea. Dermatological: No rash, lesions/masses Respiratory: No cough, dyspnea Urologic: No hematuria, dysuria Abdominal:   No nausea, vomiting, diarrhea, bright red blood per rectum, melena, or hematemesis Neurologic:  No visual changes, wkns, changes in mental status. All other systems reviewed and are otherwise negative except as noted above.  Physical Exam    VS:  BP (!) 146/64   Pulse 65   Ht '5\' 6"'$  (1.676 m)   Wt 255 lb (115.7 kg)   SpO2 91%   BMI 41.16 kg/m  , BMI Body mass  index is 41.16 kg/m. GEN: Well nourished, well developed, in no acute distress. HEENT: normal. Neck: Supple, no JVD, carotid bruits, or masses. Cardiac: RRR, no murmurs, rubs, or gallops. No clubbing, cyanosis, edema.  Radials/DP/PT 2+ and equal bilaterally.  Respiratory:  Respirations regular and unlabored, clear to auscultation bilaterally. GI: Soft, nontender, nondistended, BS + x 4. MS: no deformity or atrophy. Skin: warm and dry, no rash. Neuro:  Strength and sensation are intact. Psych: Normal affect.  Accessory Clinical Findings    Recent Labs: 03/19/2022: BUN 32; Creatinine, Ser 1.55; Potassium 5.3; Sodium 142   Recent Lipid Panel No results found for: "CHOL", "TRIG", "HDL", "CHOLHDL", "VLDL", "LDLCALC", "LDLDIRECT"  ECG personally reviewed by me today-none today.  Echocardiogram 03/13/2022  IMPRESSIONS     1. Left ventricular ejection fraction, by estimation, is 60 to 65%. The  left ventricle has normal function. The left ventricle has no regional  wall motion abnormalities. Left ventricular diastolic parameters are  consistent with Grade I diastolic  dysfunction (impaired relaxation). Elevated left ventricular end-diastolic  pressure.   2. Right ventricular systolic function is normal. The right ventricular  size is moderately enlarged. Tricuspid regurgitation signal is inadequate  for assessing PA pressure.   3. The mitral valve is degenerative. No evidence of mitral valve  regurgitation. No evidence of mitral stenosis.   4. The aortic valve is tricuspid. Aortic valve regurgitation is not  visualized. Aortic valve sclerosis/calcification is present, without any  evidence of aortic stenosis.   5. The inferior vena cava is dilated in size with <50% respiratory  variability, suggesting right atrial pressure of 15 mmHg.   Assessment & Plan   1.  Acute diastolic CHF-generalized bilateral lower extremity +1 pitting edema.  Feels swelling is greatly improved.  Reports  breathing much better.  Chest x-ray 03/19/2022 showed right midlung and left lung base opacities that were felt to be related to edema.  Echocardiogram 03/13/2022 showed an LVEF of 60-65% and G1 DD. Needs order for small personal oxygen-we will send note to Dr. Percival Spanish. Continue furosemide 40 mg daily Continue lower extremity support stockings Elevate lower extremities when not active Fluid restriction 48-52 ounces Daily weights-contact office with a weight increase of 2-3 pounds overnight or 5 pounds in  1 week.  Weight log given Heart healthy low-sodium diet-salty 6 given Order BMP  Essential hypertension-BP today 146/64.  Well-controlled at home. Continue current medical therapy  CKD stage IIIb-creatinine 1.55 on 10/21/2021.  Baseline creatinine appears to be around 1.2. Follows with PCP   Disposition: Follow-up with Dr. Percival Spanish or me in 3-4  months.   Jossie Ng. Easton Fetty NP-C     03/27/2022, 9:21 AM [address] [place] [place] [address] Office [phone] Fax [phone]  Notice: This dictation was prepared with Dragon dictation along with smaller phrase technology. Any transcriptional errors that result from this process are unintentional and may not be corrected upon review.  I spent 13 minutes examining this patient, reviewing medications, and using patient centered shared decision making involving her cardiac care.  Prior to her visit I spent greater than 20 minutes reviewing her past medical history,  medications, and prior cardiac tests.

## 2022-03-27 ENCOUNTER — Ambulatory Visit (INDEPENDENT_AMBULATORY_CARE_PROVIDER_SITE_OTHER): Payer: Medicare Other | Admitting: General Practice

## 2022-03-27 ENCOUNTER — Encounter: Payer: Self-pay | Admitting: General Practice

## 2022-03-27 ENCOUNTER — Other Ambulatory Visit: Payer: Self-pay | Admitting: *Deleted

## 2022-03-27 VITALS — BP 146/64 | HR 65 | Ht 66.0 in | Wt 255.0 lb

## 2022-03-27 DIAGNOSIS — N1832 Chronic kidney disease, stage 3b: Secondary | ICD-10-CM | POA: Diagnosis not present

## 2022-03-27 DIAGNOSIS — I5031 Acute diastolic (congestive) heart failure: Secondary | ICD-10-CM

## 2022-03-27 DIAGNOSIS — R0902 Hypoxemia: Secondary | ICD-10-CM

## 2022-03-27 DIAGNOSIS — R0602 Shortness of breath: Secondary | ICD-10-CM

## 2022-03-27 DIAGNOSIS — I10 Essential (primary) hypertension: Secondary | ICD-10-CM

## 2022-03-27 LAB — BASIC METABOLIC PANEL
BUN/Creatinine Ratio: 22 (ref 12–28)
BUN: 33 mg/dL (ref 10–36)
CO2: 30 mmol/L — ABNORMAL HIGH (ref 20–29)
Calcium: 9.2 mg/dL (ref 8.7–10.3)
Chloride: 101 mmol/L (ref 96–106)
Creatinine, Ser: 1.52 mg/dL — ABNORMAL HIGH (ref 0.57–1.00)
Glucose: 86 mg/dL (ref 70–99)
Potassium: 4.6 mmol/L (ref 3.5–5.2)
Sodium: 143 mmol/L (ref 134–144)
eGFR: 32 mL/min/{1.73_m2} — ABNORMAL LOW (ref >59)

## 2022-03-27 NOTE — Patient Instructions (Signed)
Medication Instructions:  The current medical regimen is effective;  continue present plan and medications as directed. Please refer to the Current Medication list given to you today.   *If you need a refill on your cardiac medications before your next appointment, please call your pharmacy*  Lab Work:   Testing/Procedures:  BMET TODAY   NONE  If you have labs (blood work) drawn today and your tests are completely normal, you will receive your results only by: [place] (if you have MyChart) OR  A paper copy in the mail If you have any lab test that is abnormal or we need to change your treatment, we will call you to review the results.  Special Instructions PLEASE READ AND FOLLOW SALTY 6-ATTACHED-1,'800mg'$  daily  Follow-Up: Your next appointment:  3-4 month(s) In Person with Minus Breeding, MD   At [facility], you and your health needs are our priority.  As part of our continuing mission to provide you with exceptional heart care, we have created designated Provider Care Teams.  These Care Teams include your primary Cardiologist (physician) and Advanced Practice Providers (APPs -  Physician Assistants and Nurse Practitioners) who all work together to provide you with the care you need, when you need it.  We recommend signing up for the patient portal called "MyChart".  Sign up information is provided on this After Visit Summary.  MyChart is used to connect with patients for Virtual Visits (Telemedicine).  Patients are able to view lab/test results, encounter notes, upcoming appointments, etc.  Non-urgent messages can be sent to your provider as well.   To learn more about what you can do with MyChart, go to [email or web address].    Important Information About Sugar             6 SALTY THINGS TO AVOID     1,'800MG'$  DAILY

## 2022-04-01 ENCOUNTER — Telehealth: Payer: Self-pay | Admitting: *Deleted

## 2022-04-01 DIAGNOSIS — H353221 Exudative age-related macular degeneration, left eye, with active choroidal neovascularization: Secondary | ICD-10-CM | POA: Diagnosis not present

## 2022-04-01 NOTE — Telephone Encounter (Signed)
Order faxed for overnight oximetry testing required for oxygen.

## 2022-04-02 ENCOUNTER — Telehealth: Payer: Self-pay | Admitting: General Practice

## 2022-04-02 NOTE — Telephone Encounter (Signed)
Spoke with Daughter she states that the O2 phone number is [phone] Called [employer] spoke with Gerhard Perches she states that it takes about 48 hours to "get thru their system" and she does not see anything in their system as of yet.  Informed pt daughter of the above. She will call them back Monday

## 2022-04-02 NOTE — Telephone Encounter (Signed)
Patient's daughter is calling wanting to check on the status of portable oxygen. She states she spoke with the company and they advised they have not yet received it.

## 2022-04-03 DIAGNOSIS — M25561 Pain in right knee: Secondary | ICD-10-CM | POA: Diagnosis not present

## 2022-04-03 DIAGNOSIS — M1711 Unilateral primary osteoarthritis, right knee: Secondary | ICD-10-CM | POA: Diagnosis not present

## 2022-04-06 NOTE — Telephone Encounter (Signed)
[place], for pt to receive portable O2 we need to have a written O2 rx stating to provide portable oxygen concentrator. Fax to [phone] Written rx faxed to above number.

## 2022-04-07 NOTE — Telephone Encounter (Signed)
Called daughter-explained status, verbalized understanding, she will await call from [employer]

## 2022-04-09 ENCOUNTER — Other Ambulatory Visit: Payer: Self-pay | Admitting: *Deleted

## 2022-04-09 DIAGNOSIS — I5031 Acute diastolic (congestive) heart failure: Secondary | ICD-10-CM

## 2022-04-09 DIAGNOSIS — R0602 Shortness of breath: Secondary | ICD-10-CM

## 2022-04-16 NOTE — Telephone Encounter (Signed)
Spoke with [employer] they states that pt refused to make an appointment for a walk around testing to get pt's O2 saturation to qualify for portable O2. She needs the smaller bottles or portable O2 concentrator. Debra Pinette(daughter) informed that when [employer] calls to make an appt to have O2 test done. Verbalized understanding. She will call back with any other issues/outcome.

## 2022-04-16 NOTE — Telephone Encounter (Signed)
Spoke with Gerhard Perches @ [employer] she states that she "cannot get a hold of and RT" and read the notes in her computer. She will have RT call back to discuss.

## 2022-05-04 DIAGNOSIS — I130 Hypertensive heart and chronic kidney disease with heart failure and stage 1 through stage 4 chronic kidney disease, or unspecified chronic kidney disease: Secondary | ICD-10-CM | POA: Diagnosis not present

## 2022-05-04 DIAGNOSIS — I87329 Chronic venous hypertension (idiopathic) with inflammation of unspecified lower extremity: Secondary | ICD-10-CM | POA: Diagnosis not present

## 2022-05-04 DIAGNOSIS — R600 Localized edema: Secondary | ICD-10-CM | POA: Diagnosis not present

## 2022-05-04 DIAGNOSIS — L97821 Non-pressure chronic ulcer of other part of left lower leg limited to breakdown of skin: Secondary | ICD-10-CM | POA: Diagnosis not present

## 2022-05-13 DIAGNOSIS — I83028 Varicose veins of left lower extremity with ulcer other part of lower leg: Secondary | ICD-10-CM | POA: Diagnosis not present

## 2022-05-13 DIAGNOSIS — L97821 Non-pressure chronic ulcer of other part of left lower leg limited to breakdown of skin: Secondary | ICD-10-CM | POA: Diagnosis not present

## 2022-05-13 DIAGNOSIS — R531 Weakness: Secondary | ICD-10-CM | POA: Diagnosis not present

## 2022-05-13 DIAGNOSIS — M109 Gout, unspecified: Secondary | ICD-10-CM | POA: Diagnosis not present

## 2022-05-13 DIAGNOSIS — I87329 Chronic venous hypertension (idiopathic) with inflammation of unspecified lower extremity: Secondary | ICD-10-CM | POA: Diagnosis not present

## 2022-05-13 DIAGNOSIS — I130 Hypertensive heart and chronic kidney disease with heart failure and stage 1 through stage 4 chronic kidney disease, or unspecified chronic kidney disease: Secondary | ICD-10-CM | POA: Diagnosis not present

## 2022-05-13 DIAGNOSIS — M1990 Unspecified osteoarthritis, unspecified site: Secondary | ICD-10-CM | POA: Diagnosis not present

## 2022-05-13 DIAGNOSIS — R600 Localized edema: Secondary | ICD-10-CM | POA: Diagnosis not present

## 2022-05-13 DIAGNOSIS — I509 Heart failure, unspecified: Secondary | ICD-10-CM | POA: Diagnosis not present

## 2022-05-13 DIAGNOSIS — E1169 Type 2 diabetes mellitus with other specified complication: Secondary | ICD-10-CM | POA: Diagnosis not present

## 2022-05-13 DIAGNOSIS — N1832 Chronic kidney disease, stage 3b: Secondary | ICD-10-CM | POA: Diagnosis not present

## 2022-06-02 DIAGNOSIS — N183 Chronic kidney disease, stage 3 unspecified: Secondary | ICD-10-CM | POA: Diagnosis not present

## 2022-06-02 DIAGNOSIS — M7989 Other specified soft tissue disorders: Secondary | ICD-10-CM | POA: Diagnosis not present

## 2022-06-02 DIAGNOSIS — Z79899 Other long term (current) drug therapy: Secondary | ICD-10-CM | POA: Diagnosis not present

## 2022-06-02 DIAGNOSIS — I872 Venous insufficiency (chronic) (peripheral): Secondary | ICD-10-CM | POA: Diagnosis not present

## 2022-06-02 DIAGNOSIS — I130 Hypertensive heart and chronic kidney disease with heart failure and stage 1 through stage 4 chronic kidney disease, or unspecified chronic kidney disease: Secondary | ICD-10-CM | POA: Diagnosis not present

## 2022-06-02 DIAGNOSIS — Z8673 Personal history of transient ischemic attack (TIA), and cerebral infarction without residual deficits: Secondary | ICD-10-CM | POA: Diagnosis not present

## 2022-06-02 DIAGNOSIS — E785 Hyperlipidemia, unspecified: Secondary | ICD-10-CM | POA: Diagnosis not present

## 2022-06-02 DIAGNOSIS — Z7902 Long term (current) use of antithrombotics/antiplatelets: Secondary | ICD-10-CM | POA: Diagnosis not present

## 2022-06-02 DIAGNOSIS — M109 Gout, unspecified: Secondary | ICD-10-CM | POA: Diagnosis not present

## 2022-06-02 DIAGNOSIS — L97321 Non-pressure chronic ulcer of left ankle limited to breakdown of skin: Secondary | ICD-10-CM | POA: Diagnosis not present

## 2022-06-02 DIAGNOSIS — M79661 Pain in right lower leg: Secondary | ICD-10-CM | POA: Diagnosis not present

## 2022-06-02 DIAGNOSIS — L97311 Non-pressure chronic ulcer of right ankle limited to breakdown of skin: Secondary | ICD-10-CM | POA: Diagnosis not present

## 2022-06-02 DIAGNOSIS — I83023 Varicose veins of left lower extremity with ulcer of ankle: Secondary | ICD-10-CM | POA: Diagnosis not present

## 2022-06-02 DIAGNOSIS — I509 Heart failure, unspecified: Secondary | ICD-10-CM | POA: Diagnosis not present

## 2022-06-02 DIAGNOSIS — I739 Peripheral vascular disease, unspecified: Secondary | ICD-10-CM | POA: Diagnosis not present

## 2022-06-03 DIAGNOSIS — L97321 Non-pressure chronic ulcer of left ankle limited to breakdown of skin: Secondary | ICD-10-CM | POA: Diagnosis not present

## 2022-06-03 DIAGNOSIS — Z6837 Body mass index (BMI) 37.0-37.9, adult: Secondary | ICD-10-CM | POA: Diagnosis not present

## 2022-06-03 DIAGNOSIS — E669 Obesity, unspecified: Secondary | ICD-10-CM | POA: Diagnosis not present

## 2022-06-03 DIAGNOSIS — M1030 Gout due to renal impairment, unspecified site: Secondary | ICD-10-CM | POA: Diagnosis not present

## 2022-06-03 DIAGNOSIS — M1990 Unspecified osteoarthritis, unspecified site: Secondary | ICD-10-CM | POA: Diagnosis not present

## 2022-06-03 DIAGNOSIS — N183 Chronic kidney disease, stage 3 unspecified: Secondary | ICD-10-CM | POA: Diagnosis not present

## 2022-06-03 DIAGNOSIS — I130 Hypertensive heart and chronic kidney disease with heart failure and stage 1 through stage 4 chronic kidney disease, or unspecified chronic kidney disease: Secondary | ICD-10-CM | POA: Diagnosis not present

## 2022-06-03 DIAGNOSIS — I509 Heart failure, unspecified: Secondary | ICD-10-CM | POA: Diagnosis not present

## 2022-06-03 DIAGNOSIS — I83023 Varicose veins of left lower extremity with ulcer of ankle: Secondary | ICD-10-CM | POA: Diagnosis not present

## 2022-06-03 DIAGNOSIS — I739 Peripheral vascular disease, unspecified: Secondary | ICD-10-CM | POA: Diagnosis not present

## 2022-06-03 DIAGNOSIS — Z9181 History of falling: Secondary | ICD-10-CM | POA: Diagnosis not present

## 2022-06-05 DIAGNOSIS — N183 Chronic kidney disease, stage 3 unspecified: Secondary | ICD-10-CM | POA: Diagnosis not present

## 2022-06-05 DIAGNOSIS — I130 Hypertensive heart and chronic kidney disease with heart failure and stage 1 through stage 4 chronic kidney disease, or unspecified chronic kidney disease: Secondary | ICD-10-CM | POA: Diagnosis not present

## 2022-06-05 DIAGNOSIS — I83023 Varicose veins of left lower extremity with ulcer of ankle: Secondary | ICD-10-CM | POA: Diagnosis not present

## 2022-06-05 DIAGNOSIS — M1990 Unspecified osteoarthritis, unspecified site: Secondary | ICD-10-CM | POA: Diagnosis not present

## 2022-06-05 DIAGNOSIS — I509 Heart failure, unspecified: Secondary | ICD-10-CM | POA: Diagnosis not present

## 2022-06-05 DIAGNOSIS — L97321 Non-pressure chronic ulcer of left ankle limited to breakdown of skin: Secondary | ICD-10-CM | POA: Diagnosis not present

## 2022-06-08 DIAGNOSIS — N183 Chronic kidney disease, stage 3 unspecified: Secondary | ICD-10-CM | POA: Diagnosis not present

## 2022-06-08 DIAGNOSIS — I509 Heart failure, unspecified: Secondary | ICD-10-CM | POA: Diagnosis not present

## 2022-06-08 DIAGNOSIS — I872 Venous insufficiency (chronic) (peripheral): Secondary | ICD-10-CM | POA: Diagnosis not present

## 2022-06-08 DIAGNOSIS — I130 Hypertensive heart and chronic kidney disease with heart failure and stage 1 through stage 4 chronic kidney disease, or unspecified chronic kidney disease: Secondary | ICD-10-CM | POA: Diagnosis not present

## 2022-06-08 DIAGNOSIS — I83023 Varicose veins of left lower extremity with ulcer of ankle: Secondary | ICD-10-CM | POA: Diagnosis not present

## 2022-06-08 DIAGNOSIS — L97321 Non-pressure chronic ulcer of left ankle limited to breakdown of skin: Secondary | ICD-10-CM | POA: Diagnosis not present

## 2022-06-08 DIAGNOSIS — L97311 Non-pressure chronic ulcer of right ankle limited to breakdown of skin: Secondary | ICD-10-CM | POA: Diagnosis not present

## 2022-06-08 DIAGNOSIS — M1990 Unspecified osteoarthritis, unspecified site: Secondary | ICD-10-CM | POA: Diagnosis not present

## 2022-06-10 DIAGNOSIS — I83023 Varicose veins of left lower extremity with ulcer of ankle: Secondary | ICD-10-CM | POA: Diagnosis not present

## 2022-06-10 DIAGNOSIS — M1990 Unspecified osteoarthritis, unspecified site: Secondary | ICD-10-CM | POA: Diagnosis not present

## 2022-06-10 DIAGNOSIS — I509 Heart failure, unspecified: Secondary | ICD-10-CM | POA: Diagnosis not present

## 2022-06-10 DIAGNOSIS — L97321 Non-pressure chronic ulcer of left ankle limited to breakdown of skin: Secondary | ICD-10-CM | POA: Diagnosis not present

## 2022-06-10 DIAGNOSIS — N183 Chronic kidney disease, stage 3 unspecified: Secondary | ICD-10-CM | POA: Diagnosis not present

## 2022-06-10 DIAGNOSIS — I130 Hypertensive heart and chronic kidney disease with heart failure and stage 1 through stage 4 chronic kidney disease, or unspecified chronic kidney disease: Secondary | ICD-10-CM | POA: Diagnosis not present

## 2022-06-16 DIAGNOSIS — I83023 Varicose veins of left lower extremity with ulcer of ankle: Secondary | ICD-10-CM | POA: Diagnosis not present

## 2022-06-16 DIAGNOSIS — L97321 Non-pressure chronic ulcer of left ankle limited to breakdown of skin: Secondary | ICD-10-CM | POA: Diagnosis not present

## 2022-06-17 DIAGNOSIS — L97321 Non-pressure chronic ulcer of left ankle limited to breakdown of skin: Secondary | ICD-10-CM | POA: Diagnosis not present

## 2022-06-17 DIAGNOSIS — N183 Chronic kidney disease, stage 3 unspecified: Secondary | ICD-10-CM | POA: Diagnosis not present

## 2022-06-17 DIAGNOSIS — M1990 Unspecified osteoarthritis, unspecified site: Secondary | ICD-10-CM | POA: Diagnosis not present

## 2022-06-17 DIAGNOSIS — I83023 Varicose veins of left lower extremity with ulcer of ankle: Secondary | ICD-10-CM | POA: Diagnosis not present

## 2022-06-17 DIAGNOSIS — I130 Hypertensive heart and chronic kidney disease with heart failure and stage 1 through stage 4 chronic kidney disease, or unspecified chronic kidney disease: Secondary | ICD-10-CM | POA: Diagnosis not present

## 2022-06-17 DIAGNOSIS — I509 Heart failure, unspecified: Secondary | ICD-10-CM | POA: Diagnosis not present

## 2022-07-06 ENCOUNTER — Ambulatory Visit: Payer: Medicare Other | Admitting: Cardiology

## 2022-07-08 DIAGNOSIS — H353221 Exudative age-related macular degeneration, left eye, with active choroidal neovascularization: Secondary | ICD-10-CM | POA: Diagnosis not present

## 2022-07-14 ENCOUNTER — Encounter: Payer: Self-pay | Admitting: Cardiology

## 2022-07-14 NOTE — Telephone Encounter (Signed)
Error

## 2022-08-25 DIAGNOSIS — M25511 Pain in right shoulder: Secondary | ICD-10-CM | POA: Diagnosis not present

## 2022-09-04 DIAGNOSIS — H903 Sensorineural hearing loss, bilateral: Secondary | ICD-10-CM | POA: Diagnosis not present

## 2022-09-04 DIAGNOSIS — H838X3 Other specified diseases of inner ear, bilateral: Secondary | ICD-10-CM | POA: Diagnosis not present

## 2022-09-04 DIAGNOSIS — H6123 Impacted cerumen, bilateral: Secondary | ICD-10-CM | POA: Diagnosis not present

## 2022-09-15 DIAGNOSIS — I130 Hypertensive heart and chronic kidney disease with heart failure and stage 1 through stage 4 chronic kidney disease, or unspecified chronic kidney disease: Secondary | ICD-10-CM | POA: Diagnosis not present

## 2022-09-15 DIAGNOSIS — Z7902 Long term (current) use of antithrombotics/antiplatelets: Secondary | ICD-10-CM | POA: Diagnosis not present

## 2022-09-15 DIAGNOSIS — L97321 Non-pressure chronic ulcer of left ankle limited to breakdown of skin: Secondary | ICD-10-CM | POA: Diagnosis not present

## 2022-09-15 DIAGNOSIS — Z79899 Other long term (current) drug therapy: Secondary | ICD-10-CM | POA: Diagnosis not present

## 2022-09-15 DIAGNOSIS — I509 Heart failure, unspecified: Secondary | ICD-10-CM | POA: Diagnosis not present

## 2022-09-15 DIAGNOSIS — Z8673 Personal history of transient ischemic attack (TIA), and cerebral infarction without residual deficits: Secondary | ICD-10-CM | POA: Diagnosis not present

## 2022-09-15 DIAGNOSIS — E785 Hyperlipidemia, unspecified: Secondary | ICD-10-CM | POA: Diagnosis not present

## 2022-09-15 DIAGNOSIS — E669 Obesity, unspecified: Secondary | ICD-10-CM | POA: Diagnosis not present

## 2022-09-15 DIAGNOSIS — N183 Chronic kidney disease, stage 3 unspecified: Secondary | ICD-10-CM | POA: Diagnosis not present

## 2022-09-15 DIAGNOSIS — I83023 Varicose veins of left lower extremity with ulcer of ankle: Secondary | ICD-10-CM | POA: Diagnosis not present

## 2022-09-15 DIAGNOSIS — M109 Gout, unspecified: Secondary | ICD-10-CM | POA: Diagnosis not present

## 2022-09-22 DIAGNOSIS — I509 Heart failure, unspecified: Secondary | ICD-10-CM | POA: Diagnosis not present

## 2022-09-22 DIAGNOSIS — Z6841 Body Mass Index (BMI) 40.0 and over, adult: Secondary | ICD-10-CM | POA: Diagnosis not present

## 2022-09-22 DIAGNOSIS — I83023 Varicose veins of left lower extremity with ulcer of ankle: Secondary | ICD-10-CM | POA: Diagnosis not present

## 2022-09-22 DIAGNOSIS — I130 Hypertensive heart and chronic kidney disease with heart failure and stage 1 through stage 4 chronic kidney disease, or unspecified chronic kidney disease: Secondary | ICD-10-CM | POA: Diagnosis not present

## 2022-09-22 DIAGNOSIS — I739 Peripheral vascular disease, unspecified: Secondary | ICD-10-CM | POA: Diagnosis not present

## 2022-09-22 DIAGNOSIS — L97321 Non-pressure chronic ulcer of left ankle limited to breakdown of skin: Secondary | ICD-10-CM | POA: Diagnosis not present

## 2022-09-22 DIAGNOSIS — E669 Obesity, unspecified: Secondary | ICD-10-CM | POA: Diagnosis not present

## 2022-09-22 DIAGNOSIS — Z7902 Long term (current) use of antithrombotics/antiplatelets: Secondary | ICD-10-CM | POA: Diagnosis not present

## 2022-09-22 DIAGNOSIS — M109 Gout, unspecified: Secondary | ICD-10-CM | POA: Diagnosis not present

## 2022-09-22 DIAGNOSIS — N183 Chronic kidney disease, stage 3 unspecified: Secondary | ICD-10-CM | POA: Diagnosis not present

## 2022-09-29 DIAGNOSIS — E785 Hyperlipidemia, unspecified: Secondary | ICD-10-CM | POA: Diagnosis not present

## 2022-09-29 DIAGNOSIS — I130 Hypertensive heart and chronic kidney disease with heart failure and stage 1 through stage 4 chronic kidney disease, or unspecified chronic kidney disease: Secondary | ICD-10-CM | POA: Diagnosis not present

## 2022-09-29 DIAGNOSIS — I83023 Varicose veins of left lower extremity with ulcer of ankle: Secondary | ICD-10-CM | POA: Diagnosis not present

## 2022-09-29 DIAGNOSIS — N183 Chronic kidney disease, stage 3 unspecified: Secondary | ICD-10-CM | POA: Diagnosis not present

## 2022-09-29 DIAGNOSIS — I509 Heart failure, unspecified: Secondary | ICD-10-CM | POA: Diagnosis not present

## 2022-09-29 DIAGNOSIS — L97321 Non-pressure chronic ulcer of left ankle limited to breakdown of skin: Secondary | ICD-10-CM | POA: Diagnosis not present

## 2022-10-06 DIAGNOSIS — I83023 Varicose veins of left lower extremity with ulcer of ankle: Secondary | ICD-10-CM | POA: Diagnosis not present

## 2022-10-06 DIAGNOSIS — L97321 Non-pressure chronic ulcer of left ankle limited to breakdown of skin: Secondary | ICD-10-CM | POA: Diagnosis not present

## 2022-10-06 DIAGNOSIS — I509 Heart failure, unspecified: Secondary | ICD-10-CM | POA: Diagnosis not present

## 2022-10-06 DIAGNOSIS — I739 Peripheral vascular disease, unspecified: Secondary | ICD-10-CM | POA: Diagnosis not present

## 2022-10-06 DIAGNOSIS — I130 Hypertensive heart and chronic kidney disease with heart failure and stage 1 through stage 4 chronic kidney disease, or unspecified chronic kidney disease: Secondary | ICD-10-CM | POA: Diagnosis not present

## 2022-10-06 DIAGNOSIS — N183 Chronic kidney disease, stage 3 unspecified: Secondary | ICD-10-CM | POA: Diagnosis not present

## 2022-10-10 ENCOUNTER — Emergency Department (HOSPITAL_COMMUNITY): Payer: Medicare Other

## 2022-10-10 ENCOUNTER — Other Ambulatory Visit: Payer: Self-pay

## 2022-10-10 ENCOUNTER — Emergency Department (HOSPITAL_COMMUNITY)
Admission: EM | Admit: 2022-10-10 | Discharge: 2022-10-10 | Disposition: A | Discharge status: Home / Self Care | Payer: Medicare Other | Attending: Emergency Medicine | Admitting: Emergency Medicine

## 2022-10-10 ENCOUNTER — Encounter (HOSPITAL_COMMUNITY): Payer: Self-pay | Admitting: Emergency Medicine

## 2022-10-10 DIAGNOSIS — Z79899 Other long term (current) drug therapy: Secondary | ICD-10-CM | POA: Insufficient documentation

## 2022-10-10 DIAGNOSIS — R1012 Left upper quadrant pain: Secondary | ICD-10-CM

## 2022-10-10 DIAGNOSIS — K8020 Calculus of gallbladder without cholecystitis without obstruction: Secondary | ICD-10-CM | POA: Diagnosis not present

## 2022-10-10 DIAGNOSIS — K5792 Diverticulitis of intestine, part unspecified, without perforation or abscess without bleeding: Secondary | ICD-10-CM | POA: Diagnosis not present

## 2022-10-10 DIAGNOSIS — R1900 Intra-abdominal and pelvic swelling, mass and lump, unspecified site: Secondary | ICD-10-CM

## 2022-10-10 DIAGNOSIS — Z7902 Long term (current) use of antithrombotics/antiplatelets: Secondary | ICD-10-CM | POA: Diagnosis not present

## 2022-10-10 DIAGNOSIS — R109 Unspecified abdominal pain: Secondary | ICD-10-CM | POA: Diagnosis present

## 2022-10-10 DIAGNOSIS — I10 Essential (primary) hypertension: Secondary | ICD-10-CM | POA: Insufficient documentation

## 2022-10-10 DIAGNOSIS — K5732 Diverticulitis of large intestine without perforation or abscess without bleeding: Secondary | ICD-10-CM | POA: Diagnosis not present

## 2022-10-10 HISTORY — DX: Constipation, unspecified: K59.00

## 2022-10-10 HISTORY — DX: Acute pulmonary edema: J81.0

## 2022-10-10 LAB — CBC WITH DIFFERENTIAL/PLATELET
Abs Immature Granulocytes: 0.03 10*3/uL (ref 0.00–0.07)
Basophils Absolute: 0.1 10*3/uL (ref 0.0–0.1)
Basophils Relative: 1 %
Eosinophils Absolute: 0.4 10*3/uL (ref 0.0–0.5)
Eosinophils Relative: 5 %
HCT: 52.1 % — ABNORMAL HIGH (ref 36.0–46.0)
Hemoglobin: 16.3 g/dL — ABNORMAL HIGH (ref 12.0–15.0)
Immature Granulocytes: 0 %
Lymphocytes Relative: 12 %
Lymphs Abs: 0.8 10*3/uL (ref 0.7–4.0)
MCH: 30.6 pg (ref 26.0–34.0)
MCHC: 31.3 g/dL (ref 30.0–36.0)
MCV: 97.7 fL (ref 80.0–100.0)
Monocytes Absolute: 0.5 10*3/uL (ref 0.1–1.0)
Monocytes Relative: 7 %
Neutro Abs: 5.1 10*3/uL (ref 1.7–7.7)
Neutrophils Relative %: 75 %
Platelets: 213 10*3/uL (ref 150–400)
RBC: 5.33 MIL/uL — ABNORMAL HIGH (ref 3.87–5.11)
RDW: 14.6 % (ref 11.5–15.5)
WBC: 6.9 10*3/uL (ref 4.0–10.5)
nRBC: 0 % (ref 0.0–0.2)

## 2022-10-10 LAB — COMPREHENSIVE METABOLIC PANEL
ALT: 13 U/L (ref 0–44)
AST: 18 U/L (ref 15–41)
Albumin: 3.4 g/dL — ABNORMAL LOW (ref 3.5–5.0)
Alkaline Phosphatase: 108 U/L (ref 38–126)
Anion gap: 9 (ref 5–15)
BUN: 28 mg/dL — ABNORMAL HIGH (ref 8–23)
CO2: 28 mmol/L (ref 22–32)
Calcium: 8.9 mg/dL (ref 8.9–10.3)
Chloride: 102 mmol/L (ref 98–111)
Creatinine, Ser: 1.33 mg/dL — ABNORMAL HIGH (ref 0.44–1.00)
GFR, Estimated: 37 mL/min — ABNORMAL LOW (ref >60)
Glucose, Bld: 104 mg/dL — ABNORMAL HIGH (ref 70–99)
Potassium: 4.3 mmol/L (ref 3.5–5.1)
Sodium: 139 mmol/L (ref 135–145)
Total Bilirubin: 0.8 mg/dL (ref 0.3–1.2)
Total Protein: 7.1 g/dL (ref 6.5–8.1)

## 2022-10-10 LAB — LIPASE, BLOOD: Lipase: 51 U/L (ref 11–51)

## 2022-10-10 MED ORDER — AMOXICILLIN-POT CLAVULANATE 875-125 MG PO TABS: 1.0000 | ORAL_TABLET | Freq: Two times a day (BID) | ORAL | 0 refills | Status: AC

## 2022-10-10 MED ORDER — AMOXICILLIN-POT CLAVULANATE 875-125 MG PO TABS
1.0000 | ORAL_TABLET | Freq: Once | ORAL | Status: AC
  Administered 2022-10-10: 1 via ORAL
  Filled 2022-10-10: qty 1

## 2022-10-10 MED ORDER — SODIUM CHLORIDE 0.9 % IV BOLUS
1000.0000 mL | Freq: Once | INTRAVENOUS | Status: AC
  Administered 2022-10-10: 1000 mL via INTRAVENOUS

## 2022-10-10 MED ORDER — IOHEXOL 300 MG/ML  SOLN
80.0000 mL | Freq: Once | INTRAMUSCULAR | Status: AC | PRN
  Administered 2022-10-10: 80 mL via INTRAVENOUS

## 2022-10-10 NOTE — ED Triage Notes (Signed)
Pt arrives POV c/o left flank pain for the past few days. Pt states the pain has just increased and wanted to have it looked at.

## 2022-10-10 NOTE — ED Notes (Signed)
Patient transported to CT 

## 2022-10-10 NOTE — ED Provider Notes (Signed)
[facility] Provider Note   CSN: [id number] Arrival date & time: 10/10/22  9563     History  Chief Complaint  Patient presents with   Flank Pain    Brooke Lane is a 89 y.o. female.  The history is provided by the patient and a relative.  Flank Pain This is a new problem. The current episode started 2 days ago. The problem occurs daily. The problem has been gradually worsening. Associated symptoms include abdominal pain. Pertinent negatives include no chest pain and no shortness of breath.   Patient with history of hypertension presents with abdominal pain.  She reports over the past 2-3 days she has had left side and left abdominal pain.  No fevers or vomiting.  No chest pain or shortness of breath.  No new weakness.  No recent dysuria or urinary symptoms.  No abdominal surgeries reported   Past Medical History:  Diagnosis Date   Hiatal hernia    High cholesterol    Hypertension    Pneumonia    SCC (squamous cell carcinoma) 08/24/2003   left shin superior (cx27f) ([place])   SCC (squamous cell carcinoma) 08/24/2003   left shin inferior (cx352f ([place])    SCC (squamous cell carcinoma) 03/03/2011   left elbow ([place])    SCC (squamous cell carcinoma) 03/03/2011   left bicep ([facility])    SCC (squamous cell carcinoma) 2/10/20202   left outer cheek (cx3525f([facility])   Squamous cell carcinoma of skin 04/04/1998   Left lower post leg ([id number] [facility]IS)   Stroke (HCC)[place]  Home Medications Prior to Admission medications   Medication Sig Start Date End Date Taking? Authorizing Provider  allopurinol (ZYLOPRIM) 300 MG tablet Take 1 tablet by mouth daily. 12/03/20   [provider]  amLODipine (NORVASC) 5 MG tablet Take 1 tablet (5 mg total) by mouth daily. 12/10/16   AkulHosie Poisson  chlorthalidone (HYGROTON) 25 MG tablet     [provider]  clopidogrel (PLAVIX) 75 MG tablet Take 75 mg by mouth daily.    [provider]  colchicine 0.6 MG tablet Take 0.3 mg by mouth daily as needed. 02/26/22   [provider]  dextromethorphan-guaiFENesin (MUCINEX DM) 30-600 MG 12hr tablet Take 1 tablet by mouth 2 (two) times daily as needed for cough.    [provider]  diphenhydrAMINE (BENADRYL) 25 mg capsule Take 25 mg by mouth every 6 (six) hours as needed for allergies.    [provider]  docusate sodium (COLACE) 100 MG capsule Take 1 capsule (100 mg total) by mouth 2 (two) times daily. 12/09/16   AkulHosie Poisson  doxycycline (VIBRAMYCIN) 100 MG capsule Take 1 capsule (100 mg total) by mouth 2 (two) times daily. 08/12/18   CookNat Christen  DUPIXENT 300 MG/2ML prefilled syringe INJECT '300MG'$  (1 SYRINGE) SUBCUTANEOUSLY EVERY 2 WEEKS AS DIRECTED. 11/06/21   TafeLavonna Monarch  furosemide (LASIX) 40 MG tablet Take 1 tablet (40 mg total) by mouth daily. 12/10/16   AkulHosie Poisson  gemfibrozil (LOPID) 600 MG tablet Take 600 mg by mouth 2 (two) times daily before a meal.    [provider]  linaclotide (LINZESS) 72 MCG capsule Take 1 tablet by mouth daily.    [provider]  nebivolol (BYSTOLIC) 10 MG tablet Take 1 tablet by mouth daily. 01/02/21   [provider]  sodium chloride (OCEAN) 0.65 % SOLN nasal spray Place 1 spray into  both nostrils as needed for congestion.    [provider]  sulfamethoxazole-trimethoprim (BACTRIM DS) 800-160 MG tablet Take one po bid x 10 days 12/02/20   Lavonna Monarch, MD      Allergies    Patient has no known allergies.    Review of Systems   Review of Systems  Constitutional:  Negative for fever.  Respiratory:  Negative for shortness of breath.   Cardiovascular:  Negative for chest pain.  Gastrointestinal:  Positive for abdominal pain. Negative for vomiting.  Genitourinary:  Positive for flank pain. Negative for dysuria.    Physical Exam Updated Vital Signs BP (!) 179/60   Pulse 65   Temp 98.2 F (36.8 C)  (Oral)   Resp 14   Ht 1.676 m ('5\' 6"'$ )   Wt 115.7 kg   SpO2 93%   BMI 41.17 kg/m  Physical Exam CONSTITUTIONAL: Elderly but appears younger than stated age HEAD: Normocephalic/atraumatic NECK: supple no meningeal signs SPINE/BACK:entire spine nontender CV: S1/S2 noted, no murmurs/rubs/gallops noted LUNGS: Coarse breath sounds in the bases but otherwise clear, no distress ABDOMEN: soft, mild left upper quadrant tenderness, no rebound or guarding, bowel sounds noted throughout abdomen GU:no cva tenderness NEURO: Pt is awake/alert/appropriate, moves all extremitiesx4.  No facial droop.   SKIN: warm, color normal PSYCH: no abnormalities of mood noted, alert and oriented to situation  ED Results / Procedures / Treatments   Labs (all labs ordered are listed, but only abnormal results are displayed) Labs Reviewed  CBC WITH DIFFERENTIAL/PLATELET - Abnormal; Notable for the following components:      Result Value   RBC 5.33 (*)    Hemoglobin 16.3 (*)    HCT 52.1 (*)    All other components within normal limits  COMPREHENSIVE METABOLIC PANEL  LIPASE, BLOOD  URINALYSIS, ROUTINE W REFLEX MICROSCOPIC    EKG EKG Interpretation  Date/Time:  Saturday October 10 2022 06:26:02 EST Ventricular Rate:  66 PR Interval:  197 QRS Duration: 152 QT Interval:  452 QTC Calculation: 474 R Axis:   -65 Text Interpretation: Sinus rhythm RBBB and LAFB Probable left ventricular hypertrophy Lateral infarct, age indeterminate Baseline wander in lead(s) I II III aVF V2 Interpretation limited secondary to artifact Confirmed by Ripley Fraise [phone]) on 10/10/2022 6:38:52 AM  Radiology No results found.  Procedures Procedures    Medications Ordered in ED Medications  sodium chloride 0.9 % bolus 1,000 mL (1,000 mLs Intravenous New Bag/Given 10/10/22 0539)    ED Course/ Medical Decision Making/ A&P Clinical Course as of 10/10/22 0651  Sat Oct 10, 2022  0651 Plan at signout to Dr. Sabra Heck to  follow-up on labs, reassess and likely obtain CT abdomen pelvis with contrast [DW]    Clinical Course User Index [DW] Ripley Fraise, MD                             Medical Decision Making Amount and/or Complexity of Data Reviewed Labs: ordered.   This patient presents to the ED for concern of flank pain, this involves an extensive number of treatment options, and is a complaint that carries with it a high risk of complications and morbidity.  The differential diagnosis includes but is not limited to cholecystitis, cholelithiasis, pancreatitis, gastritis, peptic ulcer disease, appendicitis, bowel obstruction, bowel perforation, diverticulitis, AAA, ischemic bowel, pyelonephritis, kidney stone    Comorbidities that complicate the patient evaluation: Patient's presentation is complicated by their history of hypertension  Social Determinants  of Health: Patient's  hard of hearing   increases the complexity of managing their presentation  Additional history obtained: Additional history obtained from family Records reviewed  previous cardiology records reviewed   Cardiac Monitoring: The patient was maintained on a cardiac monitor.  I personally viewed and interpreted the cardiac monitor which showed an underlying rhythm of:  sinus rhythm  Medicines ordered and prescription drug management: Patient declines pain medicines Will give IV fluids for rehydration  Complexity of problems addressed: Patient's presentation is most consistent with  acute presentation with potential threat to life or bodily function         Final Clinical Impression(s) / ED Diagnoses Final diagnoses:  None    Rx / DC Orders ED Discharge Orders     None         Ripley Fraise, MD 10/10/22 [phone]

## 2022-10-10 NOTE — ED Provider Notes (Signed)
Change of shift - care signed out from Dr. Christy Gentles.  I have personally spoken with and examined the patient, she is not tachycardic, her oxygen is 92 to 93%, she has clear lung sounds and is not complaining of any respiratory symptoms.  She does have mild tenderness on the left side of the abdomen but is nonperitoneal.  This is consistent with her CT scan findings of what appears to be some early diverticulitis.  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  The patient is agreeable to follow-up in the outpatient setting and has expressed her understanding to the indications for return.  At this time she is stable for discharge with Augmentin  Final diagnoses:  Left upper quadrant abdominal pain  Diverticulitis  Pelvic mass      Noemi Chapel, MD 10/10/22 [phone]

## 2022-10-10 NOTE — Discharge Instructions (Signed)
Please call your doctors office to make an appointment for the next several days for a follow-up.  The treatment for the diverticulitis is a medication called Augmentin taken twice a day, this should gradually improve your symptoms, take Tylenol or ibuprofen as needed for pain.  Please be aware that the CT scan did show that you have some type of mass or growth in your pelvis, this must be followed up by your doctor to make sure it is not cancer so that you know what your treatment options are.  Return to the ER for severe worsening pain swelling fever nausea or vomiting or any other concerning symptoms

## 2022-10-10 NOTE — ED Notes (Signed)
Pt back from CT. Repositioned pt. Pt resting in room with family

## 2022-10-13 DIAGNOSIS — E785 Hyperlipidemia, unspecified: Secondary | ICD-10-CM | POA: Diagnosis not present

## 2022-10-13 DIAGNOSIS — I509 Heart failure, unspecified: Secondary | ICD-10-CM | POA: Diagnosis not present

## 2022-10-13 DIAGNOSIS — L97321 Non-pressure chronic ulcer of left ankle limited to breakdown of skin: Secondary | ICD-10-CM | POA: Diagnosis not present

## 2022-10-13 DIAGNOSIS — N183 Chronic kidney disease, stage 3 unspecified: Secondary | ICD-10-CM | POA: Diagnosis not present

## 2022-10-13 DIAGNOSIS — I130 Hypertensive heart and chronic kidney disease with heart failure and stage 1 through stage 4 chronic kidney disease, or unspecified chronic kidney disease: Secondary | ICD-10-CM | POA: Diagnosis not present

## 2022-10-13 DIAGNOSIS — I83023 Varicose veins of left lower extremity with ulcer of ankle: Secondary | ICD-10-CM | POA: Diagnosis not present

## 2022-10-14 DIAGNOSIS — H353112 Nonexudative age-related macular degeneration, right eye, intermediate dry stage: Secondary | ICD-10-CM | POA: Diagnosis not present

## 2022-10-14 DIAGNOSIS — H53001 Unspecified amblyopia, right eye: Secondary | ICD-10-CM | POA: Diagnosis not present

## 2022-10-14 DIAGNOSIS — H353221 Exudative age-related macular degeneration, left eye, with active choroidal neovascularization: Secondary | ICD-10-CM | POA: Diagnosis not present

## 2022-10-14 DIAGNOSIS — H04123 Dry eye syndrome of bilateral lacrimal glands: Secondary | ICD-10-CM | POA: Diagnosis not present

## 2022-10-20 DIAGNOSIS — I83023 Varicose veins of left lower extremity with ulcer of ankle: Secondary | ICD-10-CM | POA: Diagnosis not present

## 2022-10-20 DIAGNOSIS — I130 Hypertensive heart and chronic kidney disease with heart failure and stage 1 through stage 4 chronic kidney disease, or unspecified chronic kidney disease: Secondary | ICD-10-CM | POA: Diagnosis not present

## 2022-10-20 DIAGNOSIS — N183 Chronic kidney disease, stage 3 unspecified: Secondary | ICD-10-CM | POA: Diagnosis not present

## 2022-10-20 DIAGNOSIS — I739 Peripheral vascular disease, unspecified: Secondary | ICD-10-CM | POA: Diagnosis not present

## 2022-10-20 DIAGNOSIS — I509 Heart failure, unspecified: Secondary | ICD-10-CM | POA: Diagnosis not present

## 2022-10-20 DIAGNOSIS — L97321 Non-pressure chronic ulcer of left ankle limited to breakdown of skin: Secondary | ICD-10-CM | POA: Diagnosis not present

## 2022-10-21 ENCOUNTER — Telehealth: Payer: Self-pay | Admitting: *Deleted

## 2022-10-21 NOTE — Telephone Encounter (Signed)
     Patient  visit on 10/10/2022  at [facility] was for treatment   Have you been able to follow up with your primary care physician? the pain has subsided and is not hurting , was wound care for the ankle but with new findings she will be seeing more specialists  The patient was able to obtain any needed medicine or equipment.  Are there diet recommendations that you are having difficulty following?  Patient expresses understanding of discharge instructions and education provided has no other needs at this time. Yes  [place] [phone] [address] , [place] [address] Email : [email or web address]

## 2022-10-22 DIAGNOSIS — Z6841 Body Mass Index (BMI) 40.0 and over, adult: Secondary | ICD-10-CM | POA: Diagnosis not present

## 2022-10-22 DIAGNOSIS — I83023 Varicose veins of left lower extremity with ulcer of ankle: Secondary | ICD-10-CM | POA: Diagnosis not present

## 2022-10-22 DIAGNOSIS — N183 Chronic kidney disease, stage 3 unspecified: Secondary | ICD-10-CM | POA: Diagnosis not present

## 2022-10-22 DIAGNOSIS — I130 Hypertensive heart and chronic kidney disease with heart failure and stage 1 through stage 4 chronic kidney disease, or unspecified chronic kidney disease: Secondary | ICD-10-CM | POA: Diagnosis not present

## 2022-10-22 DIAGNOSIS — I509 Heart failure, unspecified: Secondary | ICD-10-CM | POA: Diagnosis not present

## 2022-10-22 DIAGNOSIS — E669 Obesity, unspecified: Secondary | ICD-10-CM | POA: Diagnosis not present

## 2022-10-22 DIAGNOSIS — L97321 Non-pressure chronic ulcer of left ankle limited to breakdown of skin: Secondary | ICD-10-CM | POA: Diagnosis not present

## 2022-10-22 DIAGNOSIS — M109 Gout, unspecified: Secondary | ICD-10-CM | POA: Diagnosis not present

## 2022-10-22 DIAGNOSIS — Z7902 Long term (current) use of antithrombotics/antiplatelets: Secondary | ICD-10-CM | POA: Diagnosis not present

## 2022-10-22 DIAGNOSIS — I739 Peripheral vascular disease, unspecified: Secondary | ICD-10-CM | POA: Diagnosis not present

## 2022-10-27 DIAGNOSIS — I890 Lymphedema, not elsewhere classified: Secondary | ICD-10-CM | POA: Diagnosis not present

## 2022-10-27 DIAGNOSIS — L97321 Non-pressure chronic ulcer of left ankle limited to breakdown of skin: Secondary | ICD-10-CM | POA: Diagnosis not present

## 2022-10-27 DIAGNOSIS — I83023 Varicose veins of left lower extremity with ulcer of ankle: Secondary | ICD-10-CM | POA: Diagnosis not present

## 2022-11-02 DIAGNOSIS — I83023 Varicose veins of left lower extremity with ulcer of ankle: Secondary | ICD-10-CM | POA: Diagnosis not present

## 2022-11-02 DIAGNOSIS — I739 Peripheral vascular disease, unspecified: Secondary | ICD-10-CM | POA: Diagnosis not present

## 2022-11-02 DIAGNOSIS — N183 Chronic kidney disease, stage 3 unspecified: Secondary | ICD-10-CM | POA: Diagnosis not present

## 2022-11-02 DIAGNOSIS — L97321 Non-pressure chronic ulcer of left ankle limited to breakdown of skin: Secondary | ICD-10-CM | POA: Diagnosis not present

## 2022-11-02 DIAGNOSIS — I509 Heart failure, unspecified: Secondary | ICD-10-CM | POA: Diagnosis not present

## 2022-11-02 DIAGNOSIS — I130 Hypertensive heart and chronic kidney disease with heart failure and stage 1 through stage 4 chronic kidney disease, or unspecified chronic kidney disease: Secondary | ICD-10-CM | POA: Diagnosis not present

## 2022-11-10 DIAGNOSIS — N183 Chronic kidney disease, stage 3 unspecified: Secondary | ICD-10-CM | POA: Diagnosis not present

## 2022-11-10 DIAGNOSIS — I509 Heart failure, unspecified: Secondary | ICD-10-CM | POA: Diagnosis not present

## 2022-11-10 DIAGNOSIS — L97321 Non-pressure chronic ulcer of left ankle limited to breakdown of skin: Secondary | ICD-10-CM | POA: Diagnosis not present

## 2022-11-10 DIAGNOSIS — I83023 Varicose veins of left lower extremity with ulcer of ankle: Secondary | ICD-10-CM | POA: Diagnosis not present

## 2022-11-10 DIAGNOSIS — I739 Peripheral vascular disease, unspecified: Secondary | ICD-10-CM | POA: Diagnosis not present

## 2022-11-10 DIAGNOSIS — I130 Hypertensive heart and chronic kidney disease with heart failure and stage 1 through stage 4 chronic kidney disease, or unspecified chronic kidney disease: Secondary | ICD-10-CM | POA: Diagnosis not present

## 2022-11-17 DIAGNOSIS — M109 Gout, unspecified: Secondary | ICD-10-CM | POA: Diagnosis not present

## 2022-11-17 DIAGNOSIS — I83023 Varicose veins of left lower extremity with ulcer of ankle: Secondary | ICD-10-CM | POA: Diagnosis not present

## 2022-11-17 DIAGNOSIS — I130 Hypertensive heart and chronic kidney disease with heart failure and stage 1 through stage 4 chronic kidney disease, or unspecified chronic kidney disease: Secondary | ICD-10-CM | POA: Diagnosis not present

## 2022-11-17 DIAGNOSIS — I739 Peripheral vascular disease, unspecified: Secondary | ICD-10-CM | POA: Diagnosis not present

## 2022-11-17 DIAGNOSIS — I509 Heart failure, unspecified: Secondary | ICD-10-CM | POA: Diagnosis not present

## 2022-11-17 DIAGNOSIS — E785 Hyperlipidemia, unspecified: Secondary | ICD-10-CM | POA: Diagnosis not present

## 2022-11-17 DIAGNOSIS — L97321 Non-pressure chronic ulcer of left ankle limited to breakdown of skin: Secondary | ICD-10-CM | POA: Diagnosis not present

## 2022-11-17 DIAGNOSIS — N183 Chronic kidney disease, stage 3 unspecified: Secondary | ICD-10-CM | POA: Diagnosis not present

## 2022-11-21 DIAGNOSIS — L97321 Non-pressure chronic ulcer of left ankle limited to breakdown of skin: Secondary | ICD-10-CM | POA: Diagnosis not present

## 2022-11-21 DIAGNOSIS — I130 Hypertensive heart and chronic kidney disease with heart failure and stage 1 through stage 4 chronic kidney disease, or unspecified chronic kidney disease: Secondary | ICD-10-CM | POA: Diagnosis not present

## 2022-11-21 DIAGNOSIS — I509 Heart failure, unspecified: Secondary | ICD-10-CM | POA: Diagnosis not present

## 2022-11-21 DIAGNOSIS — M109 Gout, unspecified: Secondary | ICD-10-CM | POA: Diagnosis not present

## 2022-11-21 DIAGNOSIS — Z6841 Body Mass Index (BMI) 40.0 and over, adult: Secondary | ICD-10-CM | POA: Diagnosis not present

## 2022-11-21 DIAGNOSIS — I83023 Varicose veins of left lower extremity with ulcer of ankle: Secondary | ICD-10-CM | POA: Diagnosis not present

## 2022-11-21 DIAGNOSIS — I739 Peripheral vascular disease, unspecified: Secondary | ICD-10-CM | POA: Diagnosis not present

## 2022-11-21 DIAGNOSIS — Z7902 Long term (current) use of antithrombotics/antiplatelets: Secondary | ICD-10-CM | POA: Diagnosis not present

## 2022-11-21 DIAGNOSIS — N183 Chronic kidney disease, stage 3 unspecified: Secondary | ICD-10-CM | POA: Diagnosis not present

## 2022-11-21 DIAGNOSIS — E669 Obesity, unspecified: Secondary | ICD-10-CM | POA: Diagnosis not present

## 2022-11-24 DIAGNOSIS — I739 Peripheral vascular disease, unspecified: Secondary | ICD-10-CM | POA: Diagnosis not present

## 2022-11-24 DIAGNOSIS — I83023 Varicose veins of left lower extremity with ulcer of ankle: Secondary | ICD-10-CM | POA: Diagnosis not present

## 2022-11-24 DIAGNOSIS — N183 Chronic kidney disease, stage 3 unspecified: Secondary | ICD-10-CM | POA: Diagnosis not present

## 2022-11-24 DIAGNOSIS — L97321 Non-pressure chronic ulcer of left ankle limited to breakdown of skin: Secondary | ICD-10-CM | POA: Diagnosis not present

## 2022-11-24 DIAGNOSIS — I130 Hypertensive heart and chronic kidney disease with heart failure and stage 1 through stage 4 chronic kidney disease, or unspecified chronic kidney disease: Secondary | ICD-10-CM | POA: Diagnosis not present

## 2022-11-24 DIAGNOSIS — I509 Heart failure, unspecified: Secondary | ICD-10-CM | POA: Diagnosis not present

## 2022-12-01 DIAGNOSIS — I130 Hypertensive heart and chronic kidney disease with heart failure and stage 1 through stage 4 chronic kidney disease, or unspecified chronic kidney disease: Secondary | ICD-10-CM | POA: Diagnosis not present

## 2022-12-01 DIAGNOSIS — I509 Heart failure, unspecified: Secondary | ICD-10-CM | POA: Diagnosis not present

## 2022-12-01 DIAGNOSIS — L97321 Non-pressure chronic ulcer of left ankle limited to breakdown of skin: Secondary | ICD-10-CM | POA: Diagnosis not present

## 2022-12-01 DIAGNOSIS — I739 Peripheral vascular disease, unspecified: Secondary | ICD-10-CM | POA: Diagnosis not present

## 2022-12-01 DIAGNOSIS — I83023 Varicose veins of left lower extremity with ulcer of ankle: Secondary | ICD-10-CM | POA: Diagnosis not present

## 2022-12-01 DIAGNOSIS — N183 Chronic kidney disease, stage 3 unspecified: Secondary | ICD-10-CM | POA: Diagnosis not present

## 2022-12-08 DIAGNOSIS — N183 Chronic kidney disease, stage 3 unspecified: Secondary | ICD-10-CM | POA: Diagnosis not present

## 2022-12-08 DIAGNOSIS — I509 Heart failure, unspecified: Secondary | ICD-10-CM | POA: Diagnosis not present

## 2022-12-08 DIAGNOSIS — I130 Hypertensive heart and chronic kidney disease with heart failure and stage 1 through stage 4 chronic kidney disease, or unspecified chronic kidney disease: Secondary | ICD-10-CM | POA: Diagnosis not present

## 2022-12-08 DIAGNOSIS — M109 Gout, unspecified: Secondary | ICD-10-CM | POA: Diagnosis not present

## 2022-12-08 DIAGNOSIS — I83023 Varicose veins of left lower extremity with ulcer of ankle: Secondary | ICD-10-CM | POA: Diagnosis not present

## 2022-12-08 DIAGNOSIS — L97321 Non-pressure chronic ulcer of left ankle limited to breakdown of skin: Secondary | ICD-10-CM | POA: Diagnosis not present

## 2022-12-09 DIAGNOSIS — I509 Heart failure, unspecified: Secondary | ICD-10-CM | POA: Diagnosis not present

## 2022-12-09 DIAGNOSIS — L97321 Non-pressure chronic ulcer of left ankle limited to breakdown of skin: Secondary | ICD-10-CM | POA: Diagnosis not present

## 2022-12-09 DIAGNOSIS — I130 Hypertensive heart and chronic kidney disease with heart failure and stage 1 through stage 4 chronic kidney disease, or unspecified chronic kidney disease: Secondary | ICD-10-CM | POA: Diagnosis not present

## 2022-12-09 DIAGNOSIS — I739 Peripheral vascular disease, unspecified: Secondary | ICD-10-CM | POA: Diagnosis not present

## 2022-12-09 DIAGNOSIS — N183 Chronic kidney disease, stage 3 unspecified: Secondary | ICD-10-CM | POA: Diagnosis not present

## 2022-12-09 DIAGNOSIS — I83023 Varicose veins of left lower extremity with ulcer of ankle: Secondary | ICD-10-CM | POA: Diagnosis not present

## 2022-12-15 DIAGNOSIS — I509 Heart failure, unspecified: Secondary | ICD-10-CM | POA: Diagnosis not present

## 2022-12-15 DIAGNOSIS — I130 Hypertensive heart and chronic kidney disease with heart failure and stage 1 through stage 4 chronic kidney disease, or unspecified chronic kidney disease: Secondary | ICD-10-CM | POA: Diagnosis not present

## 2022-12-15 DIAGNOSIS — I83023 Varicose veins of left lower extremity with ulcer of ankle: Secondary | ICD-10-CM | POA: Diagnosis not present

## 2022-12-15 DIAGNOSIS — I739 Peripheral vascular disease, unspecified: Secondary | ICD-10-CM | POA: Diagnosis not present

## 2022-12-15 DIAGNOSIS — N183 Chronic kidney disease, stage 3 unspecified: Secondary | ICD-10-CM | POA: Diagnosis not present

## 2022-12-15 DIAGNOSIS — L97321 Non-pressure chronic ulcer of left ankle limited to breakdown of skin: Secondary | ICD-10-CM | POA: Diagnosis not present

## 2022-12-21 DIAGNOSIS — I509 Heart failure, unspecified: Secondary | ICD-10-CM | POA: Diagnosis not present

## 2022-12-21 DIAGNOSIS — I83023 Varicose veins of left lower extremity with ulcer of ankle: Secondary | ICD-10-CM | POA: Diagnosis not present

## 2022-12-21 DIAGNOSIS — E669 Obesity, unspecified: Secondary | ICD-10-CM | POA: Diagnosis not present

## 2022-12-21 DIAGNOSIS — Z6841 Body Mass Index (BMI) 40.0 and over, adult: Secondary | ICD-10-CM | POA: Diagnosis not present

## 2022-12-21 DIAGNOSIS — I130 Hypertensive heart and chronic kidney disease with heart failure and stage 1 through stage 4 chronic kidney disease, or unspecified chronic kidney disease: Secondary | ICD-10-CM | POA: Diagnosis not present

## 2022-12-21 DIAGNOSIS — L97321 Non-pressure chronic ulcer of left ankle limited to breakdown of skin: Secondary | ICD-10-CM | POA: Diagnosis not present

## 2022-12-21 DIAGNOSIS — M109 Gout, unspecified: Secondary | ICD-10-CM | POA: Diagnosis not present

## 2022-12-21 DIAGNOSIS — N183 Chronic kidney disease, stage 3 unspecified: Secondary | ICD-10-CM | POA: Diagnosis not present

## 2022-12-21 DIAGNOSIS — Z7902 Long term (current) use of antithrombotics/antiplatelets: Secondary | ICD-10-CM | POA: Diagnosis not present

## 2022-12-21 DIAGNOSIS — I739 Peripheral vascular disease, unspecified: Secondary | ICD-10-CM | POA: Diagnosis not present

## 2022-12-22 DIAGNOSIS — L97321 Non-pressure chronic ulcer of left ankle limited to breakdown of skin: Secondary | ICD-10-CM | POA: Diagnosis not present

## 2022-12-22 DIAGNOSIS — I83023 Varicose veins of left lower extremity with ulcer of ankle: Secondary | ICD-10-CM | POA: Diagnosis not present

## 2022-12-22 DIAGNOSIS — I130 Hypertensive heart and chronic kidney disease with heart failure and stage 1 through stage 4 chronic kidney disease, or unspecified chronic kidney disease: Secondary | ICD-10-CM | POA: Diagnosis not present

## 2022-12-22 DIAGNOSIS — I509 Heart failure, unspecified: Secondary | ICD-10-CM | POA: Diagnosis not present

## 2022-12-22 DIAGNOSIS — I739 Peripheral vascular disease, unspecified: Secondary | ICD-10-CM | POA: Diagnosis not present

## 2022-12-22 DIAGNOSIS — N183 Chronic kidney disease, stage 3 unspecified: Secondary | ICD-10-CM | POA: Diagnosis not present

## 2022-12-29 DIAGNOSIS — E669 Obesity, unspecified: Secondary | ICD-10-CM | POA: Diagnosis not present

## 2022-12-29 DIAGNOSIS — I739 Peripheral vascular disease, unspecified: Secondary | ICD-10-CM | POA: Diagnosis not present

## 2022-12-29 DIAGNOSIS — L97321 Non-pressure chronic ulcer of left ankle limited to breakdown of skin: Secondary | ICD-10-CM | POA: Diagnosis not present

## 2022-12-29 DIAGNOSIS — Z7902 Long term (current) use of antithrombotics/antiplatelets: Secondary | ICD-10-CM | POA: Diagnosis not present

## 2022-12-29 DIAGNOSIS — I130 Hypertensive heart and chronic kidney disease with heart failure and stage 1 through stage 4 chronic kidney disease, or unspecified chronic kidney disease: Secondary | ICD-10-CM | POA: Diagnosis not present

## 2022-12-29 DIAGNOSIS — Z8673 Personal history of transient ischemic attack (TIA), and cerebral infarction without residual deficits: Secondary | ICD-10-CM | POA: Diagnosis not present

## 2022-12-29 DIAGNOSIS — M109 Gout, unspecified: Secondary | ICD-10-CM | POA: Diagnosis not present

## 2022-12-29 DIAGNOSIS — I509 Heart failure, unspecified: Secondary | ICD-10-CM | POA: Diagnosis not present

## 2022-12-29 DIAGNOSIS — Z79899 Other long term (current) drug therapy: Secondary | ICD-10-CM | POA: Diagnosis not present

## 2022-12-29 DIAGNOSIS — I890 Lymphedema, not elsewhere classified: Secondary | ICD-10-CM | POA: Diagnosis not present

## 2022-12-29 DIAGNOSIS — N183 Chronic kidney disease, stage 3 unspecified: Secondary | ICD-10-CM | POA: Diagnosis not present

## 2022-12-29 DIAGNOSIS — I83023 Varicose veins of left lower extremity with ulcer of ankle: Secondary | ICD-10-CM | POA: Diagnosis not present

## 2022-12-29 DIAGNOSIS — L97221 Non-pressure chronic ulcer of left calf limited to breakdown of skin: Secondary | ICD-10-CM | POA: Diagnosis not present

## 2022-12-29 DIAGNOSIS — E785 Hyperlipidemia, unspecified: Secondary | ICD-10-CM | POA: Diagnosis not present

## 2023-01-05 DIAGNOSIS — I509 Heart failure, unspecified: Secondary | ICD-10-CM | POA: Diagnosis not present

## 2023-01-05 DIAGNOSIS — L97321 Non-pressure chronic ulcer of left ankle limited to breakdown of skin: Secondary | ICD-10-CM | POA: Diagnosis not present

## 2023-01-05 DIAGNOSIS — N183 Chronic kidney disease, stage 3 unspecified: Secondary | ICD-10-CM | POA: Diagnosis not present

## 2023-01-05 DIAGNOSIS — I739 Peripheral vascular disease, unspecified: Secondary | ICD-10-CM | POA: Diagnosis not present

## 2023-01-05 DIAGNOSIS — I130 Hypertensive heart and chronic kidney disease with heart failure and stage 1 through stage 4 chronic kidney disease, or unspecified chronic kidney disease: Secondary | ICD-10-CM | POA: Diagnosis not present

## 2023-01-05 DIAGNOSIS — I83023 Varicose veins of left lower extremity with ulcer of ankle: Secondary | ICD-10-CM | POA: Diagnosis not present

## 2023-01-12 DIAGNOSIS — I87329 Chronic venous hypertension (idiopathic) with inflammation of unspecified lower extremity: Secondary | ICD-10-CM | POA: Diagnosis not present

## 2023-01-12 DIAGNOSIS — L03115 Cellulitis of right lower limb: Secondary | ICD-10-CM | POA: Diagnosis not present

## 2023-01-12 DIAGNOSIS — I509 Heart failure, unspecified: Secondary | ICD-10-CM | POA: Diagnosis not present

## 2023-01-12 DIAGNOSIS — L97821 Non-pressure chronic ulcer of other part of left lower leg limited to breakdown of skin: Secondary | ICD-10-CM | POA: Diagnosis not present

## 2023-01-12 DIAGNOSIS — I83028 Varicose veins of left lower extremity with ulcer other part of lower leg: Secondary | ICD-10-CM | POA: Diagnosis not present

## 2023-01-13 DIAGNOSIS — I739 Peripheral vascular disease, unspecified: Secondary | ICD-10-CM | POA: Diagnosis not present

## 2023-01-13 DIAGNOSIS — I509 Heart failure, unspecified: Secondary | ICD-10-CM | POA: Diagnosis not present

## 2023-01-13 DIAGNOSIS — I83023 Varicose veins of left lower extremity with ulcer of ankle: Secondary | ICD-10-CM | POA: Diagnosis not present

## 2023-01-13 DIAGNOSIS — L97321 Non-pressure chronic ulcer of left ankle limited to breakdown of skin: Secondary | ICD-10-CM | POA: Diagnosis not present

## 2023-01-13 DIAGNOSIS — I130 Hypertensive heart and chronic kidney disease with heart failure and stage 1 through stage 4 chronic kidney disease, or unspecified chronic kidney disease: Secondary | ICD-10-CM | POA: Diagnosis not present

## 2023-01-13 DIAGNOSIS — N183 Chronic kidney disease, stage 3 unspecified: Secondary | ICD-10-CM | POA: Diagnosis not present

## 2023-01-19 DIAGNOSIS — I739 Peripheral vascular disease, unspecified: Secondary | ICD-10-CM | POA: Diagnosis not present

## 2023-01-19 DIAGNOSIS — I83023 Varicose veins of left lower extremity with ulcer of ankle: Secondary | ICD-10-CM | POA: Diagnosis not present

## 2023-01-19 DIAGNOSIS — N183 Chronic kidney disease, stage 3 unspecified: Secondary | ICD-10-CM | POA: Diagnosis not present

## 2023-01-19 DIAGNOSIS — L97321 Non-pressure chronic ulcer of left ankle limited to breakdown of skin: Secondary | ICD-10-CM | POA: Diagnosis not present

## 2023-01-19 DIAGNOSIS — I509 Heart failure, unspecified: Secondary | ICD-10-CM | POA: Diagnosis not present

## 2023-01-19 DIAGNOSIS — I130 Hypertensive heart and chronic kidney disease with heart failure and stage 1 through stage 4 chronic kidney disease, or unspecified chronic kidney disease: Secondary | ICD-10-CM | POA: Diagnosis not present

## 2023-01-20 DIAGNOSIS — Z6841 Body Mass Index (BMI) 40.0 and over, adult: Secondary | ICD-10-CM | POA: Diagnosis not present

## 2023-01-20 DIAGNOSIS — N183 Chronic kidney disease, stage 3 unspecified: Secondary | ICD-10-CM | POA: Diagnosis not present

## 2023-01-20 DIAGNOSIS — I83023 Varicose veins of left lower extremity with ulcer of ankle: Secondary | ICD-10-CM | POA: Diagnosis not present

## 2023-01-20 DIAGNOSIS — E669 Obesity, unspecified: Secondary | ICD-10-CM | POA: Diagnosis not present

## 2023-01-20 DIAGNOSIS — Z7902 Long term (current) use of antithrombotics/antiplatelets: Secondary | ICD-10-CM | POA: Diagnosis not present

## 2023-01-20 DIAGNOSIS — L97321 Non-pressure chronic ulcer of left ankle limited to breakdown of skin: Secondary | ICD-10-CM | POA: Diagnosis not present

## 2023-01-20 DIAGNOSIS — M109 Gout, unspecified: Secondary | ICD-10-CM | POA: Diagnosis not present

## 2023-01-20 DIAGNOSIS — I739 Peripheral vascular disease, unspecified: Secondary | ICD-10-CM | POA: Diagnosis not present

## 2023-01-20 DIAGNOSIS — I509 Heart failure, unspecified: Secondary | ICD-10-CM | POA: Diagnosis not present

## 2023-01-20 DIAGNOSIS — H353221 Exudative age-related macular degeneration, left eye, with active choroidal neovascularization: Secondary | ICD-10-CM | POA: Diagnosis not present

## 2023-01-20 DIAGNOSIS — I130 Hypertensive heart and chronic kidney disease with heart failure and stage 1 through stage 4 chronic kidney disease, or unspecified chronic kidney disease: Secondary | ICD-10-CM | POA: Diagnosis not present

## 2023-01-26 DIAGNOSIS — L97321 Non-pressure chronic ulcer of left ankle limited to breakdown of skin: Secondary | ICD-10-CM | POA: Diagnosis not present

## 2023-01-26 DIAGNOSIS — I509 Heart failure, unspecified: Secondary | ICD-10-CM | POA: Diagnosis not present

## 2023-01-26 DIAGNOSIS — I83023 Varicose veins of left lower extremity with ulcer of ankle: Secondary | ICD-10-CM | POA: Diagnosis not present

## 2023-01-26 DIAGNOSIS — I130 Hypertensive heart and chronic kidney disease with heart failure and stage 1 through stage 4 chronic kidney disease, or unspecified chronic kidney disease: Secondary | ICD-10-CM | POA: Diagnosis not present

## 2023-01-26 DIAGNOSIS — I739 Peripheral vascular disease, unspecified: Secondary | ICD-10-CM | POA: Diagnosis not present

## 2023-01-26 DIAGNOSIS — N183 Chronic kidney disease, stage 3 unspecified: Secondary | ICD-10-CM | POA: Diagnosis not present

## 2023-02-02 DIAGNOSIS — L97321 Non-pressure chronic ulcer of left ankle limited to breakdown of skin: Secondary | ICD-10-CM | POA: Diagnosis not present

## 2023-02-02 DIAGNOSIS — I509 Heart failure, unspecified: Secondary | ICD-10-CM | POA: Diagnosis not present

## 2023-02-02 DIAGNOSIS — I739 Peripheral vascular disease, unspecified: Secondary | ICD-10-CM | POA: Diagnosis not present

## 2023-02-02 DIAGNOSIS — I83023 Varicose veins of left lower extremity with ulcer of ankle: Secondary | ICD-10-CM | POA: Diagnosis not present

## 2023-02-02 DIAGNOSIS — N183 Chronic kidney disease, stage 3 unspecified: Secondary | ICD-10-CM | POA: Diagnosis not present

## 2023-02-02 DIAGNOSIS — I130 Hypertensive heart and chronic kidney disease with heart failure and stage 1 through stage 4 chronic kidney disease, or unspecified chronic kidney disease: Secondary | ICD-10-CM | POA: Diagnosis not present

## 2023-02-09 DIAGNOSIS — I890 Lymphedema, not elsewhere classified: Secondary | ICD-10-CM | POA: Diagnosis not present

## 2023-02-10 DIAGNOSIS — L97321 Non-pressure chronic ulcer of left ankle limited to breakdown of skin: Secondary | ICD-10-CM | POA: Diagnosis not present

## 2023-02-10 DIAGNOSIS — N183 Chronic kidney disease, stage 3 unspecified: Secondary | ICD-10-CM | POA: Diagnosis not present

## 2023-02-10 DIAGNOSIS — I509 Heart failure, unspecified: Secondary | ICD-10-CM | POA: Diagnosis not present

## 2023-02-10 DIAGNOSIS — I130 Hypertensive heart and chronic kidney disease with heart failure and stage 1 through stage 4 chronic kidney disease, or unspecified chronic kidney disease: Secondary | ICD-10-CM | POA: Diagnosis not present

## 2023-02-10 DIAGNOSIS — I83023 Varicose veins of left lower extremity with ulcer of ankle: Secondary | ICD-10-CM | POA: Diagnosis not present

## 2023-02-10 DIAGNOSIS — I739 Peripheral vascular disease, unspecified: Secondary | ICD-10-CM | POA: Diagnosis not present

## 2023-02-16 DIAGNOSIS — I130 Hypertensive heart and chronic kidney disease with heart failure and stage 1 through stage 4 chronic kidney disease, or unspecified chronic kidney disease: Secondary | ICD-10-CM | POA: Diagnosis not present

## 2023-02-16 DIAGNOSIS — I509 Heart failure, unspecified: Secondary | ICD-10-CM | POA: Diagnosis not present

## 2023-02-16 DIAGNOSIS — L97321 Non-pressure chronic ulcer of left ankle limited to breakdown of skin: Secondary | ICD-10-CM | POA: Diagnosis not present

## 2023-02-16 DIAGNOSIS — I739 Peripheral vascular disease, unspecified: Secondary | ICD-10-CM | POA: Diagnosis not present

## 2023-02-16 DIAGNOSIS — I83023 Varicose veins of left lower extremity with ulcer of ankle: Secondary | ICD-10-CM | POA: Diagnosis not present

## 2023-02-16 DIAGNOSIS — N183 Chronic kidney disease, stage 3 unspecified: Secondary | ICD-10-CM | POA: Diagnosis not present

## 2023-02-19 DIAGNOSIS — I509 Heart failure, unspecified: Secondary | ICD-10-CM | POA: Diagnosis not present

## 2023-02-19 DIAGNOSIS — E669 Obesity, unspecified: Secondary | ICD-10-CM | POA: Diagnosis not present

## 2023-02-19 DIAGNOSIS — Z7902 Long term (current) use of antithrombotics/antiplatelets: Secondary | ICD-10-CM | POA: Diagnosis not present

## 2023-02-19 DIAGNOSIS — L97321 Non-pressure chronic ulcer of left ankle limited to breakdown of skin: Secondary | ICD-10-CM | POA: Diagnosis not present

## 2023-02-19 DIAGNOSIS — M109 Gout, unspecified: Secondary | ICD-10-CM | POA: Diagnosis not present

## 2023-02-19 DIAGNOSIS — I130 Hypertensive heart and chronic kidney disease with heart failure and stage 1 through stage 4 chronic kidney disease, or unspecified chronic kidney disease: Secondary | ICD-10-CM | POA: Diagnosis not present

## 2023-02-19 DIAGNOSIS — I739 Peripheral vascular disease, unspecified: Secondary | ICD-10-CM | POA: Diagnosis not present

## 2023-02-19 DIAGNOSIS — Z6841 Body Mass Index (BMI) 40.0 and over, adult: Secondary | ICD-10-CM | POA: Diagnosis not present

## 2023-02-19 DIAGNOSIS — I83023 Varicose veins of left lower extremity with ulcer of ankle: Secondary | ICD-10-CM | POA: Diagnosis not present

## 2023-02-19 DIAGNOSIS — N183 Chronic kidney disease, stage 3 unspecified: Secondary | ICD-10-CM | POA: Diagnosis not present

## 2023-02-23 DIAGNOSIS — L97321 Non-pressure chronic ulcer of left ankle limited to breakdown of skin: Secondary | ICD-10-CM | POA: Diagnosis not present

## 2023-02-23 DIAGNOSIS — I130 Hypertensive heart and chronic kidney disease with heart failure and stage 1 through stage 4 chronic kidney disease, or unspecified chronic kidney disease: Secondary | ICD-10-CM | POA: Diagnosis not present

## 2023-02-23 DIAGNOSIS — N183 Chronic kidney disease, stage 3 unspecified: Secondary | ICD-10-CM | POA: Diagnosis not present

## 2023-02-23 DIAGNOSIS — I83023 Varicose veins of left lower extremity with ulcer of ankle: Secondary | ICD-10-CM | POA: Diagnosis not present

## 2023-02-23 DIAGNOSIS — I509 Heart failure, unspecified: Secondary | ICD-10-CM | POA: Diagnosis not present

## 2023-02-23 DIAGNOSIS — I739 Peripheral vascular disease, unspecified: Secondary | ICD-10-CM | POA: Diagnosis not present

## 2023-03-01 DIAGNOSIS — I83023 Varicose veins of left lower extremity with ulcer of ankle: Secondary | ICD-10-CM | POA: Diagnosis not present

## 2023-03-01 DIAGNOSIS — L97321 Non-pressure chronic ulcer of left ankle limited to breakdown of skin: Secondary | ICD-10-CM | POA: Diagnosis not present

## 2023-03-01 DIAGNOSIS — I509 Heart failure, unspecified: Secondary | ICD-10-CM | POA: Diagnosis not present

## 2023-03-01 DIAGNOSIS — N183 Chronic kidney disease, stage 3 unspecified: Secondary | ICD-10-CM | POA: Diagnosis not present

## 2023-03-01 DIAGNOSIS — I739 Peripheral vascular disease, unspecified: Secondary | ICD-10-CM | POA: Diagnosis not present

## 2023-03-01 DIAGNOSIS — I130 Hypertensive heart and chronic kidney disease with heart failure and stage 1 through stage 4 chronic kidney disease, or unspecified chronic kidney disease: Secondary | ICD-10-CM | POA: Diagnosis not present

## 2023-03-09 DIAGNOSIS — I130 Hypertensive heart and chronic kidney disease with heart failure and stage 1 through stage 4 chronic kidney disease, or unspecified chronic kidney disease: Secondary | ICD-10-CM | POA: Diagnosis not present

## 2023-03-09 DIAGNOSIS — I739 Peripheral vascular disease, unspecified: Secondary | ICD-10-CM | POA: Diagnosis not present

## 2023-03-09 DIAGNOSIS — L97321 Non-pressure chronic ulcer of left ankle limited to breakdown of skin: Secondary | ICD-10-CM | POA: Diagnosis not present

## 2023-03-09 DIAGNOSIS — N183 Chronic kidney disease, stage 3 unspecified: Secondary | ICD-10-CM | POA: Diagnosis not present

## 2023-03-09 DIAGNOSIS — I83023 Varicose veins of left lower extremity with ulcer of ankle: Secondary | ICD-10-CM | POA: Diagnosis not present

## 2023-03-09 DIAGNOSIS — I509 Heart failure, unspecified: Secondary | ICD-10-CM | POA: Diagnosis not present

## 2023-03-15 DIAGNOSIS — I509 Heart failure, unspecified: Secondary | ICD-10-CM | POA: Diagnosis not present

## 2023-03-15 DIAGNOSIS — N183 Chronic kidney disease, stage 3 unspecified: Secondary | ICD-10-CM | POA: Diagnosis not present

## 2023-03-15 DIAGNOSIS — I130 Hypertensive heart and chronic kidney disease with heart failure and stage 1 through stage 4 chronic kidney disease, or unspecified chronic kidney disease: Secondary | ICD-10-CM | POA: Diagnosis not present

## 2023-03-15 DIAGNOSIS — I739 Peripheral vascular disease, unspecified: Secondary | ICD-10-CM | POA: Diagnosis not present

## 2023-03-15 DIAGNOSIS — I83023 Varicose veins of left lower extremity with ulcer of ankle: Secondary | ICD-10-CM | POA: Diagnosis not present

## 2023-03-15 DIAGNOSIS — L97321 Non-pressure chronic ulcer of left ankle limited to breakdown of skin: Secondary | ICD-10-CM | POA: Diagnosis not present

## 2023-03-17 DIAGNOSIS — I509 Heart failure, unspecified: Secondary | ICD-10-CM | POA: Diagnosis not present

## 2023-03-17 DIAGNOSIS — N183 Chronic kidney disease, stage 3 unspecified: Secondary | ICD-10-CM | POA: Diagnosis not present

## 2023-03-17 DIAGNOSIS — I739 Peripheral vascular disease, unspecified: Secondary | ICD-10-CM | POA: Diagnosis not present

## 2023-03-17 DIAGNOSIS — L97321 Non-pressure chronic ulcer of left ankle limited to breakdown of skin: Secondary | ICD-10-CM | POA: Diagnosis not present

## 2023-03-17 DIAGNOSIS — I130 Hypertensive heart and chronic kidney disease with heart failure and stage 1 through stage 4 chronic kidney disease, or unspecified chronic kidney disease: Secondary | ICD-10-CM | POA: Diagnosis not present

## 2023-03-17 DIAGNOSIS — I83023 Varicose veins of left lower extremity with ulcer of ankle: Secondary | ICD-10-CM | POA: Diagnosis not present

## 2023-03-21 DIAGNOSIS — I509 Heart failure, unspecified: Secondary | ICD-10-CM | POA: Diagnosis not present

## 2023-03-21 DIAGNOSIS — I739 Peripheral vascular disease, unspecified: Secondary | ICD-10-CM | POA: Diagnosis not present

## 2023-03-21 DIAGNOSIS — Z6841 Body Mass Index (BMI) 40.0 and over, adult: Secondary | ICD-10-CM | POA: Diagnosis not present

## 2023-03-21 DIAGNOSIS — N183 Chronic kidney disease, stage 3 unspecified: Secondary | ICD-10-CM | POA: Diagnosis not present

## 2023-03-21 DIAGNOSIS — E669 Obesity, unspecified: Secondary | ICD-10-CM | POA: Diagnosis not present

## 2023-03-21 DIAGNOSIS — L97321 Non-pressure chronic ulcer of left ankle limited to breakdown of skin: Secondary | ICD-10-CM | POA: Diagnosis not present

## 2023-03-21 DIAGNOSIS — I130 Hypertensive heart and chronic kidney disease with heart failure and stage 1 through stage 4 chronic kidney disease, or unspecified chronic kidney disease: Secondary | ICD-10-CM | POA: Diagnosis not present

## 2023-03-21 DIAGNOSIS — Z7902 Long term (current) use of antithrombotics/antiplatelets: Secondary | ICD-10-CM | POA: Diagnosis not present

## 2023-03-21 DIAGNOSIS — M109 Gout, unspecified: Secondary | ICD-10-CM | POA: Diagnosis not present

## 2023-03-21 DIAGNOSIS — I83023 Varicose veins of left lower extremity with ulcer of ankle: Secondary | ICD-10-CM | POA: Diagnosis not present

## 2023-03-23 DIAGNOSIS — I83023 Varicose veins of left lower extremity with ulcer of ankle: Secondary | ICD-10-CM | POA: Diagnosis not present

## 2023-03-23 DIAGNOSIS — I739 Peripheral vascular disease, unspecified: Secondary | ICD-10-CM | POA: Diagnosis not present

## 2023-03-23 DIAGNOSIS — N1832 Chronic kidney disease, stage 3b: Secondary | ICD-10-CM | POA: Diagnosis not present

## 2023-03-23 DIAGNOSIS — I509 Heart failure, unspecified: Secondary | ICD-10-CM | POA: Diagnosis not present

## 2023-03-23 DIAGNOSIS — E1169 Type 2 diabetes mellitus with other specified complication: Secondary | ICD-10-CM | POA: Diagnosis not present

## 2023-03-23 DIAGNOSIS — E785 Hyperlipidemia, unspecified: Secondary | ICD-10-CM | POA: Diagnosis not present

## 2023-03-23 DIAGNOSIS — M109 Gout, unspecified: Secondary | ICD-10-CM | POA: Diagnosis not present

## 2023-03-23 DIAGNOSIS — L97321 Non-pressure chronic ulcer of left ankle limited to breakdown of skin: Secondary | ICD-10-CM | POA: Diagnosis not present

## 2023-03-23 DIAGNOSIS — I130 Hypertensive heart and chronic kidney disease with heart failure and stage 1 through stage 4 chronic kidney disease, or unspecified chronic kidney disease: Secondary | ICD-10-CM | POA: Diagnosis not present

## 2023-03-23 DIAGNOSIS — N183 Chronic kidney disease, stage 3 unspecified: Secondary | ICD-10-CM | POA: Diagnosis not present

## 2023-03-29 DIAGNOSIS — I83023 Varicose veins of left lower extremity with ulcer of ankle: Secondary | ICD-10-CM | POA: Diagnosis not present

## 2023-03-29 DIAGNOSIS — I509 Heart failure, unspecified: Secondary | ICD-10-CM | POA: Diagnosis not present

## 2023-03-29 DIAGNOSIS — I739 Peripheral vascular disease, unspecified: Secondary | ICD-10-CM | POA: Diagnosis not present

## 2023-03-29 DIAGNOSIS — N183 Chronic kidney disease, stage 3 unspecified: Secondary | ICD-10-CM | POA: Diagnosis not present

## 2023-03-29 DIAGNOSIS — I130 Hypertensive heart and chronic kidney disease with heart failure and stage 1 through stage 4 chronic kidney disease, or unspecified chronic kidney disease: Secondary | ICD-10-CM | POA: Diagnosis not present

## 2023-03-29 DIAGNOSIS — L97321 Non-pressure chronic ulcer of left ankle limited to breakdown of skin: Secondary | ICD-10-CM | POA: Diagnosis not present

## 2023-03-30 DIAGNOSIS — I509 Heart failure, unspecified: Secondary | ICD-10-CM | POA: Diagnosis not present

## 2023-03-30 DIAGNOSIS — I83028 Varicose veins of left lower extremity with ulcer other part of lower leg: Secondary | ICD-10-CM | POA: Diagnosis not present

## 2023-03-30 DIAGNOSIS — I10 Essential (primary) hypertension: Secondary | ICD-10-CM | POA: Diagnosis not present

## 2023-03-30 DIAGNOSIS — E1169 Type 2 diabetes mellitus with other specified complication: Secondary | ICD-10-CM | POA: Diagnosis not present

## 2023-03-30 DIAGNOSIS — Z23 Encounter for immunization: Secondary | ICD-10-CM | POA: Diagnosis not present

## 2023-03-30 DIAGNOSIS — I87329 Chronic venous hypertension (idiopathic) with inflammation of unspecified lower extremity: Secondary | ICD-10-CM | POA: Diagnosis not present

## 2023-03-30 DIAGNOSIS — E785 Hyperlipidemia, unspecified: Secondary | ICD-10-CM | POA: Diagnosis not present

## 2023-03-30 DIAGNOSIS — L97821 Non-pressure chronic ulcer of other part of left lower leg limited to breakdown of skin: Secondary | ICD-10-CM | POA: Diagnosis not present

## 2023-03-30 DIAGNOSIS — I130 Hypertensive heart and chronic kidney disease with heart failure and stage 1 through stage 4 chronic kidney disease, or unspecified chronic kidney disease: Secondary | ICD-10-CM | POA: Diagnosis not present

## 2023-03-30 DIAGNOSIS — Z0000 Encounter for general adult medical examination without abnormal findings: Secondary | ICD-10-CM | POA: Diagnosis not present

## 2023-03-30 DIAGNOSIS — Z1339 Encounter for screening examination for other mental health and behavioral disorders: Secondary | ICD-10-CM | POA: Diagnosis not present

## 2023-03-30 DIAGNOSIS — Z1331 Encounter for screening for depression: Secondary | ICD-10-CM | POA: Diagnosis not present

## 2023-03-30 DIAGNOSIS — N1832 Chronic kidney disease, stage 3b: Secondary | ICD-10-CM | POA: Diagnosis not present

## 2023-04-06 DIAGNOSIS — I739 Peripheral vascular disease, unspecified: Secondary | ICD-10-CM | POA: Diagnosis not present

## 2023-04-06 DIAGNOSIS — I130 Hypertensive heart and chronic kidney disease with heart failure and stage 1 through stage 4 chronic kidney disease, or unspecified chronic kidney disease: Secondary | ICD-10-CM | POA: Diagnosis not present

## 2023-04-06 DIAGNOSIS — I83023 Varicose veins of left lower extremity with ulcer of ankle: Secondary | ICD-10-CM | POA: Diagnosis not present

## 2023-04-06 DIAGNOSIS — N183 Chronic kidney disease, stage 3 unspecified: Secondary | ICD-10-CM | POA: Diagnosis not present

## 2023-04-06 DIAGNOSIS — L97321 Non-pressure chronic ulcer of left ankle limited to breakdown of skin: Secondary | ICD-10-CM | POA: Diagnosis not present

## 2023-04-06 DIAGNOSIS — I509 Heart failure, unspecified: Secondary | ICD-10-CM | POA: Diagnosis not present

## 2023-04-13 DIAGNOSIS — I83023 Varicose veins of left lower extremity with ulcer of ankle: Secondary | ICD-10-CM | POA: Diagnosis not present

## 2023-04-13 DIAGNOSIS — L97321 Non-pressure chronic ulcer of left ankle limited to breakdown of skin: Secondary | ICD-10-CM | POA: Diagnosis not present

## 2023-04-13 DIAGNOSIS — N183 Chronic kidney disease, stage 3 unspecified: Secondary | ICD-10-CM | POA: Diagnosis not present

## 2023-04-13 DIAGNOSIS — I739 Peripheral vascular disease, unspecified: Secondary | ICD-10-CM | POA: Diagnosis not present

## 2023-04-13 DIAGNOSIS — I130 Hypertensive heart and chronic kidney disease with heart failure and stage 1 through stage 4 chronic kidney disease, or unspecified chronic kidney disease: Secondary | ICD-10-CM | POA: Diagnosis not present

## 2023-04-13 DIAGNOSIS — I509 Heart failure, unspecified: Secondary | ICD-10-CM | POA: Diagnosis not present

## 2023-04-20 DIAGNOSIS — M109 Gout, unspecified: Secondary | ICD-10-CM | POA: Diagnosis not present

## 2023-04-20 DIAGNOSIS — N183 Chronic kidney disease, stage 3 unspecified: Secondary | ICD-10-CM | POA: Diagnosis not present

## 2023-04-20 DIAGNOSIS — L97321 Non-pressure chronic ulcer of left ankle limited to breakdown of skin: Secondary | ICD-10-CM | POA: Diagnosis not present

## 2023-04-20 DIAGNOSIS — E669 Obesity, unspecified: Secondary | ICD-10-CM | POA: Diagnosis not present

## 2023-04-20 DIAGNOSIS — I739 Peripheral vascular disease, unspecified: Secondary | ICD-10-CM | POA: Diagnosis not present

## 2023-04-20 DIAGNOSIS — I83023 Varicose veins of left lower extremity with ulcer of ankle: Secondary | ICD-10-CM | POA: Diagnosis not present

## 2023-04-20 DIAGNOSIS — I509 Heart failure, unspecified: Secondary | ICD-10-CM | POA: Diagnosis not present

## 2023-04-20 DIAGNOSIS — Z6841 Body Mass Index (BMI) 40.0 and over, adult: Secondary | ICD-10-CM | POA: Diagnosis not present

## 2023-04-20 DIAGNOSIS — I130 Hypertensive heart and chronic kidney disease with heart failure and stage 1 through stage 4 chronic kidney disease, or unspecified chronic kidney disease: Secondary | ICD-10-CM | POA: Diagnosis not present

## 2023-04-20 DIAGNOSIS — Z7902 Long term (current) use of antithrombotics/antiplatelets: Secondary | ICD-10-CM | POA: Diagnosis not present

## 2023-04-26 DIAGNOSIS — I130 Hypertensive heart and chronic kidney disease with heart failure and stage 1 through stage 4 chronic kidney disease, or unspecified chronic kidney disease: Secondary | ICD-10-CM | POA: Diagnosis not present

## 2023-04-26 DIAGNOSIS — I83023 Varicose veins of left lower extremity with ulcer of ankle: Secondary | ICD-10-CM | POA: Diagnosis not present

## 2023-04-26 DIAGNOSIS — I739 Peripheral vascular disease, unspecified: Secondary | ICD-10-CM | POA: Diagnosis not present

## 2023-04-26 DIAGNOSIS — N183 Chronic kidney disease, stage 3 unspecified: Secondary | ICD-10-CM | POA: Diagnosis not present

## 2023-04-26 DIAGNOSIS — I509 Heart failure, unspecified: Secondary | ICD-10-CM | POA: Diagnosis not present

## 2023-04-26 DIAGNOSIS — L97321 Non-pressure chronic ulcer of left ankle limited to breakdown of skin: Secondary | ICD-10-CM | POA: Diagnosis not present

## 2023-04-30 DIAGNOSIS — H353221 Exudative age-related macular degeneration, left eye, with active choroidal neovascularization: Secondary | ICD-10-CM | POA: Diagnosis not present

## 2023-05-11 DIAGNOSIS — I739 Peripheral vascular disease, unspecified: Secondary | ICD-10-CM | POA: Diagnosis not present

## 2023-05-11 DIAGNOSIS — I509 Heart failure, unspecified: Secondary | ICD-10-CM | POA: Diagnosis not present

## 2023-05-11 DIAGNOSIS — L97321 Non-pressure chronic ulcer of left ankle limited to breakdown of skin: Secondary | ICD-10-CM | POA: Diagnosis not present

## 2023-05-11 DIAGNOSIS — I83023 Varicose veins of left lower extremity with ulcer of ankle: Secondary | ICD-10-CM | POA: Diagnosis not present

## 2023-05-11 DIAGNOSIS — I130 Hypertensive heart and chronic kidney disease with heart failure and stage 1 through stage 4 chronic kidney disease, or unspecified chronic kidney disease: Secondary | ICD-10-CM | POA: Diagnosis not present

## 2023-05-11 DIAGNOSIS — N183 Chronic kidney disease, stage 3 unspecified: Secondary | ICD-10-CM | POA: Diagnosis not present

## 2023-05-18 DIAGNOSIS — I509 Heart failure, unspecified: Secondary | ICD-10-CM | POA: Diagnosis not present

## 2023-05-18 DIAGNOSIS — I130 Hypertensive heart and chronic kidney disease with heart failure and stage 1 through stage 4 chronic kidney disease, or unspecified chronic kidney disease: Secondary | ICD-10-CM | POA: Diagnosis not present

## 2023-05-18 DIAGNOSIS — L97321 Non-pressure chronic ulcer of left ankle limited to breakdown of skin: Secondary | ICD-10-CM | POA: Diagnosis not present

## 2023-05-18 DIAGNOSIS — N183 Chronic kidney disease, stage 3 unspecified: Secondary | ICD-10-CM | POA: Diagnosis not present

## 2023-05-18 DIAGNOSIS — I739 Peripheral vascular disease, unspecified: Secondary | ICD-10-CM | POA: Diagnosis not present

## 2023-05-18 DIAGNOSIS — I83023 Varicose veins of left lower extremity with ulcer of ankle: Secondary | ICD-10-CM | POA: Diagnosis not present

## 2023-05-20 DIAGNOSIS — M109 Gout, unspecified: Secondary | ICD-10-CM | POA: Diagnosis not present

## 2023-05-20 DIAGNOSIS — N183 Chronic kidney disease, stage 3 unspecified: Secondary | ICD-10-CM | POA: Diagnosis not present

## 2023-05-20 DIAGNOSIS — E669 Obesity, unspecified: Secondary | ICD-10-CM | POA: Diagnosis not present

## 2023-05-20 DIAGNOSIS — Z7902 Long term (current) use of antithrombotics/antiplatelets: Secondary | ICD-10-CM | POA: Diagnosis not present

## 2023-05-20 DIAGNOSIS — I83023 Varicose veins of left lower extremity with ulcer of ankle: Secondary | ICD-10-CM | POA: Diagnosis not present

## 2023-05-20 DIAGNOSIS — I739 Peripheral vascular disease, unspecified: Secondary | ICD-10-CM | POA: Diagnosis not present

## 2023-05-20 DIAGNOSIS — I83013 Varicose veins of right lower extremity with ulcer of ankle: Secondary | ICD-10-CM | POA: Diagnosis not present

## 2023-05-20 DIAGNOSIS — L97911 Non-pressure chronic ulcer of unspecified part of right lower leg limited to breakdown of skin: Secondary | ICD-10-CM | POA: Diagnosis not present

## 2023-05-20 DIAGNOSIS — Z6841 Body Mass Index (BMI) 40.0 and over, adult: Secondary | ICD-10-CM | POA: Diagnosis not present

## 2023-05-20 DIAGNOSIS — I509 Heart failure, unspecified: Secondary | ICD-10-CM | POA: Diagnosis not present

## 2023-05-20 DIAGNOSIS — I130 Hypertensive heart and chronic kidney disease with heart failure and stage 1 through stage 4 chronic kidney disease, or unspecified chronic kidney disease: Secondary | ICD-10-CM | POA: Diagnosis not present

## 2023-05-20 DIAGNOSIS — L97321 Non-pressure chronic ulcer of left ankle limited to breakdown of skin: Secondary | ICD-10-CM | POA: Diagnosis not present

## 2023-05-25 DIAGNOSIS — I83023 Varicose veins of left lower extremity with ulcer of ankle: Secondary | ICD-10-CM | POA: Diagnosis not present

## 2023-05-25 DIAGNOSIS — I509 Heart failure, unspecified: Secondary | ICD-10-CM | POA: Diagnosis not present

## 2023-05-25 DIAGNOSIS — L97321 Non-pressure chronic ulcer of left ankle limited to breakdown of skin: Secondary | ICD-10-CM | POA: Diagnosis not present

## 2023-05-25 DIAGNOSIS — I83013 Varicose veins of right lower extremity with ulcer of ankle: Secondary | ICD-10-CM | POA: Diagnosis not present

## 2023-05-25 DIAGNOSIS — L97911 Non-pressure chronic ulcer of unspecified part of right lower leg limited to breakdown of skin: Secondary | ICD-10-CM | POA: Diagnosis not present

## 2023-05-25 DIAGNOSIS — I130 Hypertensive heart and chronic kidney disease with heart failure and stage 1 through stage 4 chronic kidney disease, or unspecified chronic kidney disease: Secondary | ICD-10-CM | POA: Diagnosis not present

## 2023-06-01 DIAGNOSIS — L97321 Non-pressure chronic ulcer of left ankle limited to breakdown of skin: Secondary | ICD-10-CM | POA: Diagnosis not present

## 2023-06-01 DIAGNOSIS — I130 Hypertensive heart and chronic kidney disease with heart failure and stage 1 through stage 4 chronic kidney disease, or unspecified chronic kidney disease: Secondary | ICD-10-CM | POA: Diagnosis not present

## 2023-06-01 DIAGNOSIS — I83013 Varicose veins of right lower extremity with ulcer of ankle: Secondary | ICD-10-CM | POA: Diagnosis not present

## 2023-06-01 DIAGNOSIS — I83023 Varicose veins of left lower extremity with ulcer of ankle: Secondary | ICD-10-CM | POA: Diagnosis not present

## 2023-06-01 DIAGNOSIS — L97911 Non-pressure chronic ulcer of unspecified part of right lower leg limited to breakdown of skin: Secondary | ICD-10-CM | POA: Diagnosis not present

## 2023-06-01 DIAGNOSIS — I509 Heart failure, unspecified: Secondary | ICD-10-CM | POA: Diagnosis not present

## 2023-06-08 DIAGNOSIS — I83023 Varicose veins of left lower extremity with ulcer of ankle: Secondary | ICD-10-CM | POA: Diagnosis not present

## 2023-06-08 DIAGNOSIS — L97321 Non-pressure chronic ulcer of left ankle limited to breakdown of skin: Secondary | ICD-10-CM | POA: Diagnosis not present

## 2023-06-08 DIAGNOSIS — I130 Hypertensive heart and chronic kidney disease with heart failure and stage 1 through stage 4 chronic kidney disease, or unspecified chronic kidney disease: Secondary | ICD-10-CM | POA: Diagnosis not present

## 2023-06-08 DIAGNOSIS — L97911 Non-pressure chronic ulcer of unspecified part of right lower leg limited to breakdown of skin: Secondary | ICD-10-CM | POA: Diagnosis not present

## 2023-06-08 DIAGNOSIS — I83013 Varicose veins of right lower extremity with ulcer of ankle: Secondary | ICD-10-CM | POA: Diagnosis not present

## 2023-06-08 DIAGNOSIS — I509 Heart failure, unspecified: Secondary | ICD-10-CM | POA: Diagnosis not present

## 2023-06-17 DIAGNOSIS — L97321 Non-pressure chronic ulcer of left ankle limited to breakdown of skin: Secondary | ICD-10-CM | POA: Diagnosis not present

## 2023-06-17 DIAGNOSIS — L97911 Non-pressure chronic ulcer of unspecified part of right lower leg limited to breakdown of skin: Secondary | ICD-10-CM | POA: Diagnosis not present

## 2023-06-17 DIAGNOSIS — I83023 Varicose veins of left lower extremity with ulcer of ankle: Secondary | ICD-10-CM | POA: Diagnosis not present

## 2023-06-17 DIAGNOSIS — I130 Hypertensive heart and chronic kidney disease with heart failure and stage 1 through stage 4 chronic kidney disease, or unspecified chronic kidney disease: Secondary | ICD-10-CM | POA: Diagnosis not present

## 2023-06-17 DIAGNOSIS — I83013 Varicose veins of right lower extremity with ulcer of ankle: Secondary | ICD-10-CM | POA: Diagnosis not present

## 2023-06-17 DIAGNOSIS — I509 Heart failure, unspecified: Secondary | ICD-10-CM | POA: Diagnosis not present

## 2023-06-19 DIAGNOSIS — M109 Gout, unspecified: Secondary | ICD-10-CM | POA: Diagnosis not present

## 2023-06-19 DIAGNOSIS — L97321 Non-pressure chronic ulcer of left ankle limited to breakdown of skin: Secondary | ICD-10-CM | POA: Diagnosis not present

## 2023-06-19 DIAGNOSIS — I739 Peripheral vascular disease, unspecified: Secondary | ICD-10-CM | POA: Diagnosis not present

## 2023-06-19 DIAGNOSIS — Z7902 Long term (current) use of antithrombotics/antiplatelets: Secondary | ICD-10-CM | POA: Diagnosis not present

## 2023-06-19 DIAGNOSIS — I83023 Varicose veins of left lower extremity with ulcer of ankle: Secondary | ICD-10-CM | POA: Diagnosis not present

## 2023-06-19 DIAGNOSIS — L97911 Non-pressure chronic ulcer of unspecified part of right lower leg limited to breakdown of skin: Secondary | ICD-10-CM | POA: Diagnosis not present

## 2023-06-19 DIAGNOSIS — I130 Hypertensive heart and chronic kidney disease with heart failure and stage 1 through stage 4 chronic kidney disease, or unspecified chronic kidney disease: Secondary | ICD-10-CM | POA: Diagnosis not present

## 2023-06-19 DIAGNOSIS — I83013 Varicose veins of right lower extremity with ulcer of ankle: Secondary | ICD-10-CM | POA: Diagnosis not present

## 2023-06-19 DIAGNOSIS — Z6841 Body Mass Index (BMI) 40.0 and over, adult: Secondary | ICD-10-CM | POA: Diagnosis not present

## 2023-06-19 DIAGNOSIS — N183 Chronic kidney disease, stage 3 unspecified: Secondary | ICD-10-CM | POA: Diagnosis not present

## 2023-06-19 DIAGNOSIS — I509 Heart failure, unspecified: Secondary | ICD-10-CM | POA: Diagnosis not present

## 2023-06-19 DIAGNOSIS — E669 Obesity, unspecified: Secondary | ICD-10-CM | POA: Diagnosis not present

## 2023-07-01 DIAGNOSIS — I83023 Varicose veins of left lower extremity with ulcer of ankle: Secondary | ICD-10-CM | POA: Diagnosis not present

## 2023-07-01 DIAGNOSIS — I83013 Varicose veins of right lower extremity with ulcer of ankle: Secondary | ICD-10-CM | POA: Diagnosis not present

## 2023-07-01 DIAGNOSIS — L97911 Non-pressure chronic ulcer of unspecified part of right lower leg limited to breakdown of skin: Secondary | ICD-10-CM | POA: Diagnosis not present

## 2023-07-01 DIAGNOSIS — L97321 Non-pressure chronic ulcer of left ankle limited to breakdown of skin: Secondary | ICD-10-CM | POA: Diagnosis not present

## 2023-07-01 DIAGNOSIS — I509 Heart failure, unspecified: Secondary | ICD-10-CM | POA: Diagnosis not present

## 2023-07-01 DIAGNOSIS — I130 Hypertensive heart and chronic kidney disease with heart failure and stage 1 through stage 4 chronic kidney disease, or unspecified chronic kidney disease: Secondary | ICD-10-CM | POA: Diagnosis not present

## 2023-08-18 DIAGNOSIS — H353221 Exudative age-related macular degeneration, left eye, with active choroidal neovascularization: Secondary | ICD-10-CM | POA: Diagnosis not present

## 2023-09-21 DIAGNOSIS — I509 Heart failure, unspecified: Secondary | ICD-10-CM | POA: Diagnosis not present

## 2023-09-21 DIAGNOSIS — L309 Dermatitis, unspecified: Secondary | ICD-10-CM | POA: Diagnosis not present

## 2023-09-21 DIAGNOSIS — N1832 Chronic kidney disease, stage 3b: Secondary | ICD-10-CM | POA: Diagnosis not present

## 2023-09-21 DIAGNOSIS — R490 Dysphonia: Secondary | ICD-10-CM | POA: Diagnosis not present

## 2023-09-21 DIAGNOSIS — M1990 Unspecified osteoarthritis, unspecified site: Secondary | ICD-10-CM | POA: Diagnosis not present

## 2023-09-21 DIAGNOSIS — H3530 Unspecified macular degeneration: Secondary | ICD-10-CM | POA: Diagnosis not present

## 2023-09-21 DIAGNOSIS — E785 Hyperlipidemia, unspecified: Secondary | ICD-10-CM | POA: Diagnosis not present

## 2023-09-21 DIAGNOSIS — I87329 Chronic venous hypertension (idiopathic) with inflammation of unspecified lower extremity: Secondary | ICD-10-CM | POA: Diagnosis not present

## 2023-09-21 DIAGNOSIS — I130 Hypertensive heart and chronic kidney disease with heart failure and stage 1 through stage 4 chronic kidney disease, or unspecified chronic kidney disease: Secondary | ICD-10-CM | POA: Diagnosis not present

## 2023-09-21 DIAGNOSIS — I83028 Varicose veins of left lower extremity with ulcer other part of lower leg: Secondary | ICD-10-CM | POA: Diagnosis not present

## 2023-09-21 DIAGNOSIS — M109 Gout, unspecified: Secondary | ICD-10-CM | POA: Diagnosis not present

## 2023-09-21 DIAGNOSIS — E1169 Type 2 diabetes mellitus with other specified complication: Secondary | ICD-10-CM | POA: Diagnosis not present

## 2023-12-01 DIAGNOSIS — H353221 Exudative age-related macular degeneration, left eye, with active choroidal neovascularization: Secondary | ICD-10-CM | POA: Diagnosis not present

## 2023-12-27 DIAGNOSIS — L03116 Cellulitis of left lower limb: Secondary | ICD-10-CM | POA: Diagnosis not present

## 2023-12-27 DIAGNOSIS — I130 Hypertensive heart and chronic kidney disease with heart failure and stage 1 through stage 4 chronic kidney disease, or unspecified chronic kidney disease: Secondary | ICD-10-CM | POA: Diagnosis not present

## 2023-12-27 DIAGNOSIS — I509 Heart failure, unspecified: Secondary | ICD-10-CM | POA: Diagnosis not present

## 2023-12-27 DIAGNOSIS — L97821 Non-pressure chronic ulcer of other part of left lower leg limited to breakdown of skin: Secondary | ICD-10-CM | POA: Diagnosis not present

## 2023-12-27 DIAGNOSIS — I5032 Chronic diastolic (congestive) heart failure: Secondary | ICD-10-CM | POA: Diagnosis not present

## 2023-12-27 DIAGNOSIS — I83028 Varicose veins of left lower extremity with ulcer other part of lower leg: Secondary | ICD-10-CM | POA: Diagnosis not present

## 2024-01-26 DIAGNOSIS — N189 Chronic kidney disease, unspecified: Secondary | ICD-10-CM | POA: Diagnosis not present

## 2024-01-26 DIAGNOSIS — I83023 Varicose veins of left lower extremity with ulcer of ankle: Secondary | ICD-10-CM | POA: Diagnosis not present

## 2024-01-26 DIAGNOSIS — Z6841 Body Mass Index (BMI) 40.0 and over, adult: Secondary | ICD-10-CM | POA: Diagnosis not present

## 2024-01-26 DIAGNOSIS — Z7722 Contact with and (suspected) exposure to environmental tobacco smoke (acute) (chronic): Secondary | ICD-10-CM | POA: Diagnosis not present

## 2024-01-26 DIAGNOSIS — L03116 Cellulitis of left lower limb: Secondary | ICD-10-CM | POA: Diagnosis not present

## 2024-01-26 DIAGNOSIS — Z7902 Long term (current) use of antithrombotics/antiplatelets: Secondary | ICD-10-CM | POA: Diagnosis not present

## 2024-01-26 DIAGNOSIS — E669 Obesity, unspecified: Secondary | ICD-10-CM | POA: Diagnosis not present

## 2024-01-26 DIAGNOSIS — K5900 Constipation, unspecified: Secondary | ICD-10-CM | POA: Diagnosis not present

## 2024-01-26 DIAGNOSIS — Z9981 Dependence on supplemental oxygen: Secondary | ICD-10-CM | POA: Diagnosis not present

## 2024-01-26 DIAGNOSIS — L97821 Non-pressure chronic ulcer of other part of left lower leg limited to breakdown of skin: Secondary | ICD-10-CM | POA: Diagnosis not present

## 2024-01-26 DIAGNOSIS — I83028 Varicose veins of left lower extremity with ulcer other part of lower leg: Secondary | ICD-10-CM | POA: Diagnosis not present

## 2024-01-26 DIAGNOSIS — M109 Gout, unspecified: Secondary | ICD-10-CM | POA: Diagnosis not present

## 2024-01-26 DIAGNOSIS — L97321 Non-pressure chronic ulcer of left ankle limited to breakdown of skin: Secondary | ICD-10-CM | POA: Diagnosis not present

## 2024-01-26 DIAGNOSIS — Z8673 Personal history of transient ischemic attack (TIA), and cerebral infarction without residual deficits: Secondary | ICD-10-CM | POA: Diagnosis not present

## 2024-01-26 DIAGNOSIS — I5032 Chronic diastolic (congestive) heart failure: Secondary | ICD-10-CM | POA: Diagnosis not present

## 2024-01-26 DIAGNOSIS — I130 Hypertensive heart and chronic kidney disease with heart failure and stage 1 through stage 4 chronic kidney disease, or unspecified chronic kidney disease: Secondary | ICD-10-CM | POA: Diagnosis not present

## 2024-01-31 DIAGNOSIS — I83023 Varicose veins of left lower extremity with ulcer of ankle: Secondary | ICD-10-CM | POA: Diagnosis not present

## 2024-01-31 DIAGNOSIS — L97821 Non-pressure chronic ulcer of other part of left lower leg limited to breakdown of skin: Secondary | ICD-10-CM | POA: Diagnosis not present

## 2024-01-31 DIAGNOSIS — I83028 Varicose veins of left lower extremity with ulcer other part of lower leg: Secondary | ICD-10-CM | POA: Diagnosis not present

## 2024-01-31 DIAGNOSIS — L97321 Non-pressure chronic ulcer of left ankle limited to breakdown of skin: Secondary | ICD-10-CM | POA: Diagnosis not present

## 2024-01-31 DIAGNOSIS — I130 Hypertensive heart and chronic kidney disease with heart failure and stage 1 through stage 4 chronic kidney disease, or unspecified chronic kidney disease: Secondary | ICD-10-CM | POA: Diagnosis not present

## 2024-01-31 DIAGNOSIS — I5032 Chronic diastolic (congestive) heart failure: Secondary | ICD-10-CM | POA: Diagnosis not present

## 2024-02-03 DIAGNOSIS — I83023 Varicose veins of left lower extremity with ulcer of ankle: Secondary | ICD-10-CM | POA: Diagnosis not present

## 2024-02-03 DIAGNOSIS — L97321 Non-pressure chronic ulcer of left ankle limited to breakdown of skin: Secondary | ICD-10-CM | POA: Diagnosis not present

## 2024-02-03 DIAGNOSIS — L97821 Non-pressure chronic ulcer of other part of left lower leg limited to breakdown of skin: Secondary | ICD-10-CM | POA: Diagnosis not present

## 2024-02-03 DIAGNOSIS — I83028 Varicose veins of left lower extremity with ulcer other part of lower leg: Secondary | ICD-10-CM | POA: Diagnosis not present

## 2024-02-03 DIAGNOSIS — I130 Hypertensive heart and chronic kidney disease with heart failure and stage 1 through stage 4 chronic kidney disease, or unspecified chronic kidney disease: Secondary | ICD-10-CM | POA: Diagnosis not present

## 2024-02-03 DIAGNOSIS — I5032 Chronic diastolic (congestive) heart failure: Secondary | ICD-10-CM | POA: Diagnosis not present

## 2024-02-08 DIAGNOSIS — L97821 Non-pressure chronic ulcer of other part of left lower leg limited to breakdown of skin: Secondary | ICD-10-CM | POA: Diagnosis not present

## 2024-02-08 DIAGNOSIS — I130 Hypertensive heart and chronic kidney disease with heart failure and stage 1 through stage 4 chronic kidney disease, or unspecified chronic kidney disease: Secondary | ICD-10-CM | POA: Diagnosis not present

## 2024-02-08 DIAGNOSIS — I83028 Varicose veins of left lower extremity with ulcer other part of lower leg: Secondary | ICD-10-CM | POA: Diagnosis not present

## 2024-02-08 DIAGNOSIS — L97321 Non-pressure chronic ulcer of left ankle limited to breakdown of skin: Secondary | ICD-10-CM | POA: Diagnosis not present

## 2024-02-08 DIAGNOSIS — I83023 Varicose veins of left lower extremity with ulcer of ankle: Secondary | ICD-10-CM | POA: Diagnosis not present

## 2024-02-08 DIAGNOSIS — I5032 Chronic diastolic (congestive) heart failure: Secondary | ICD-10-CM | POA: Diagnosis not present

## 2024-02-09 DIAGNOSIS — Z8673 Personal history of transient ischemic attack (TIA), and cerebral infarction without residual deficits: Secondary | ICD-10-CM | POA: Diagnosis not present

## 2024-02-09 DIAGNOSIS — I5032 Chronic diastolic (congestive) heart failure: Secondary | ICD-10-CM | POA: Diagnosis not present

## 2024-02-09 DIAGNOSIS — E669 Obesity, unspecified: Secondary | ICD-10-CM | POA: Diagnosis not present

## 2024-02-09 DIAGNOSIS — I130 Hypertensive heart and chronic kidney disease with heart failure and stage 1 through stage 4 chronic kidney disease, or unspecified chronic kidney disease: Secondary | ICD-10-CM | POA: Diagnosis not present

## 2024-02-09 DIAGNOSIS — L03116 Cellulitis of left lower limb: Secondary | ICD-10-CM | POA: Diagnosis not present

## 2024-02-09 DIAGNOSIS — N189 Chronic kidney disease, unspecified: Secondary | ICD-10-CM | POA: Diagnosis not present

## 2024-02-09 DIAGNOSIS — K5900 Constipation, unspecified: Secondary | ICD-10-CM | POA: Diagnosis not present

## 2024-02-09 DIAGNOSIS — I83028 Varicose veins of left lower extremity with ulcer other part of lower leg: Secondary | ICD-10-CM | POA: Diagnosis not present

## 2024-02-09 DIAGNOSIS — L97821 Non-pressure chronic ulcer of other part of left lower leg limited to breakdown of skin: Secondary | ICD-10-CM | POA: Diagnosis not present

## 2024-02-09 DIAGNOSIS — I83023 Varicose veins of left lower extremity with ulcer of ankle: Secondary | ICD-10-CM | POA: Diagnosis not present

## 2024-02-09 DIAGNOSIS — M109 Gout, unspecified: Secondary | ICD-10-CM | POA: Diagnosis not present

## 2024-02-09 DIAGNOSIS — L97321 Non-pressure chronic ulcer of left ankle limited to breakdown of skin: Secondary | ICD-10-CM | POA: Diagnosis not present

## 2024-02-10 DIAGNOSIS — L97821 Non-pressure chronic ulcer of other part of left lower leg limited to breakdown of skin: Secondary | ICD-10-CM | POA: Diagnosis not present

## 2024-02-10 DIAGNOSIS — I83028 Varicose veins of left lower extremity with ulcer other part of lower leg: Secondary | ICD-10-CM | POA: Diagnosis not present

## 2024-02-10 DIAGNOSIS — I130 Hypertensive heart and chronic kidney disease with heart failure and stage 1 through stage 4 chronic kidney disease, or unspecified chronic kidney disease: Secondary | ICD-10-CM | POA: Diagnosis not present

## 2024-02-10 DIAGNOSIS — L97321 Non-pressure chronic ulcer of left ankle limited to breakdown of skin: Secondary | ICD-10-CM | POA: Diagnosis not present

## 2024-02-10 DIAGNOSIS — I5032 Chronic diastolic (congestive) heart failure: Secondary | ICD-10-CM | POA: Diagnosis not present

## 2024-02-10 DIAGNOSIS — I83023 Varicose veins of left lower extremity with ulcer of ankle: Secondary | ICD-10-CM | POA: Diagnosis not present

## 2024-02-14 DIAGNOSIS — I83023 Varicose veins of left lower extremity with ulcer of ankle: Secondary | ICD-10-CM | POA: Diagnosis not present

## 2024-02-14 DIAGNOSIS — L97821 Non-pressure chronic ulcer of other part of left lower leg limited to breakdown of skin: Secondary | ICD-10-CM | POA: Diagnosis not present

## 2024-02-14 DIAGNOSIS — I5032 Chronic diastolic (congestive) heart failure: Secondary | ICD-10-CM | POA: Diagnosis not present

## 2024-02-14 DIAGNOSIS — I130 Hypertensive heart and chronic kidney disease with heart failure and stage 1 through stage 4 chronic kidney disease, or unspecified chronic kidney disease: Secondary | ICD-10-CM | POA: Diagnosis not present

## 2024-02-14 DIAGNOSIS — I83028 Varicose veins of left lower extremity with ulcer other part of lower leg: Secondary | ICD-10-CM | POA: Diagnosis not present

## 2024-02-14 DIAGNOSIS — L97321 Non-pressure chronic ulcer of left ankle limited to breakdown of skin: Secondary | ICD-10-CM | POA: Diagnosis not present

## 2024-02-17 DIAGNOSIS — I130 Hypertensive heart and chronic kidney disease with heart failure and stage 1 through stage 4 chronic kidney disease, or unspecified chronic kidney disease: Secondary | ICD-10-CM | POA: Diagnosis not present

## 2024-02-17 DIAGNOSIS — L97821 Non-pressure chronic ulcer of other part of left lower leg limited to breakdown of skin: Secondary | ICD-10-CM | POA: Diagnosis not present

## 2024-02-17 DIAGNOSIS — L97321 Non-pressure chronic ulcer of left ankle limited to breakdown of skin: Secondary | ICD-10-CM | POA: Diagnosis not present

## 2024-02-17 DIAGNOSIS — I83028 Varicose veins of left lower extremity with ulcer other part of lower leg: Secondary | ICD-10-CM | POA: Diagnosis not present

## 2024-02-17 DIAGNOSIS — I83023 Varicose veins of left lower extremity with ulcer of ankle: Secondary | ICD-10-CM | POA: Diagnosis not present

## 2024-02-17 DIAGNOSIS — I5032 Chronic diastolic (congestive) heart failure: Secondary | ICD-10-CM | POA: Diagnosis not present

## 2024-02-22 DIAGNOSIS — I130 Hypertensive heart and chronic kidney disease with heart failure and stage 1 through stage 4 chronic kidney disease, or unspecified chronic kidney disease: Secondary | ICD-10-CM | POA: Diagnosis not present

## 2024-02-22 DIAGNOSIS — I5032 Chronic diastolic (congestive) heart failure: Secondary | ICD-10-CM | POA: Diagnosis not present

## 2024-02-22 DIAGNOSIS — L97821 Non-pressure chronic ulcer of other part of left lower leg limited to breakdown of skin: Secondary | ICD-10-CM | POA: Diagnosis not present

## 2024-02-22 DIAGNOSIS — I83028 Varicose veins of left lower extremity with ulcer other part of lower leg: Secondary | ICD-10-CM | POA: Diagnosis not present

## 2024-02-22 DIAGNOSIS — L97321 Non-pressure chronic ulcer of left ankle limited to breakdown of skin: Secondary | ICD-10-CM | POA: Diagnosis not present

## 2024-02-22 DIAGNOSIS — I83023 Varicose veins of left lower extremity with ulcer of ankle: Secondary | ICD-10-CM | POA: Diagnosis not present

## 2024-02-23 DIAGNOSIS — H353221 Exudative age-related macular degeneration, left eye, with active choroidal neovascularization: Secondary | ICD-10-CM | POA: Diagnosis not present

## 2024-02-25 DIAGNOSIS — Z7902 Long term (current) use of antithrombotics/antiplatelets: Secondary | ICD-10-CM | POA: Diagnosis not present

## 2024-02-25 DIAGNOSIS — Z8673 Personal history of transient ischemic attack (TIA), and cerebral infarction without residual deficits: Secondary | ICD-10-CM | POA: Diagnosis not present

## 2024-02-25 DIAGNOSIS — E669 Obesity, unspecified: Secondary | ICD-10-CM | POA: Diagnosis not present

## 2024-02-25 DIAGNOSIS — I130 Hypertensive heart and chronic kidney disease with heart failure and stage 1 through stage 4 chronic kidney disease, or unspecified chronic kidney disease: Secondary | ICD-10-CM | POA: Diagnosis not present

## 2024-02-25 DIAGNOSIS — Z7722 Contact with and (suspected) exposure to environmental tobacco smoke (acute) (chronic): Secondary | ICD-10-CM | POA: Diagnosis not present

## 2024-02-25 DIAGNOSIS — Z6841 Body Mass Index (BMI) 40.0 and over, adult: Secondary | ICD-10-CM | POA: Diagnosis not present

## 2024-02-25 DIAGNOSIS — M109 Gout, unspecified: Secondary | ICD-10-CM | POA: Diagnosis not present

## 2024-02-25 DIAGNOSIS — I5032 Chronic diastolic (congestive) heart failure: Secondary | ICD-10-CM | POA: Diagnosis not present

## 2024-02-25 DIAGNOSIS — I83023 Varicose veins of left lower extremity with ulcer of ankle: Secondary | ICD-10-CM | POA: Diagnosis not present

## 2024-02-25 DIAGNOSIS — L97321 Non-pressure chronic ulcer of left ankle limited to breakdown of skin: Secondary | ICD-10-CM | POA: Diagnosis not present

## 2024-02-25 DIAGNOSIS — L03116 Cellulitis of left lower limb: Secondary | ICD-10-CM | POA: Diagnosis not present

## 2024-02-25 DIAGNOSIS — K5900 Constipation, unspecified: Secondary | ICD-10-CM | POA: Diagnosis not present

## 2024-02-25 DIAGNOSIS — I83028 Varicose veins of left lower extremity with ulcer other part of lower leg: Secondary | ICD-10-CM | POA: Diagnosis not present

## 2024-02-25 DIAGNOSIS — Z9981 Dependence on supplemental oxygen: Secondary | ICD-10-CM | POA: Diagnosis not present

## 2024-02-25 DIAGNOSIS — L97821 Non-pressure chronic ulcer of other part of left lower leg limited to breakdown of skin: Secondary | ICD-10-CM | POA: Diagnosis not present

## 2024-02-25 DIAGNOSIS — N189 Chronic kidney disease, unspecified: Secondary | ICD-10-CM | POA: Diagnosis not present

## 2024-02-28 DIAGNOSIS — I83023 Varicose veins of left lower extremity with ulcer of ankle: Secondary | ICD-10-CM | POA: Diagnosis not present

## 2024-02-28 DIAGNOSIS — L97821 Non-pressure chronic ulcer of other part of left lower leg limited to breakdown of skin: Secondary | ICD-10-CM | POA: Diagnosis not present

## 2024-02-28 DIAGNOSIS — I83028 Varicose veins of left lower extremity with ulcer other part of lower leg: Secondary | ICD-10-CM | POA: Diagnosis not present

## 2024-02-28 DIAGNOSIS — L97321 Non-pressure chronic ulcer of left ankle limited to breakdown of skin: Secondary | ICD-10-CM | POA: Diagnosis not present

## 2024-02-28 DIAGNOSIS — I130 Hypertensive heart and chronic kidney disease with heart failure and stage 1 through stage 4 chronic kidney disease, or unspecified chronic kidney disease: Secondary | ICD-10-CM | POA: Diagnosis not present

## 2024-02-28 DIAGNOSIS — I5032 Chronic diastolic (congestive) heart failure: Secondary | ICD-10-CM | POA: Diagnosis not present

## 2024-03-01 DIAGNOSIS — I130 Hypertensive heart and chronic kidney disease with heart failure and stage 1 through stage 4 chronic kidney disease, or unspecified chronic kidney disease: Secondary | ICD-10-CM | POA: Diagnosis not present

## 2024-03-01 DIAGNOSIS — I5032 Chronic diastolic (congestive) heart failure: Secondary | ICD-10-CM | POA: Diagnosis not present

## 2024-03-01 DIAGNOSIS — L97821 Non-pressure chronic ulcer of other part of left lower leg limited to breakdown of skin: Secondary | ICD-10-CM | POA: Diagnosis not present

## 2024-03-01 DIAGNOSIS — I83023 Varicose veins of left lower extremity with ulcer of ankle: Secondary | ICD-10-CM | POA: Diagnosis not present

## 2024-03-01 DIAGNOSIS — I83028 Varicose veins of left lower extremity with ulcer other part of lower leg: Secondary | ICD-10-CM | POA: Diagnosis not present

## 2024-03-01 DIAGNOSIS — L97321 Non-pressure chronic ulcer of left ankle limited to breakdown of skin: Secondary | ICD-10-CM | POA: Diagnosis not present

## 2024-03-03 DIAGNOSIS — Z9189 Other specified personal risk factors, not elsewhere classified: Secondary | ICD-10-CM | POA: Diagnosis not present

## 2024-03-03 DIAGNOSIS — R634 Abnormal weight loss: Secondary | ICD-10-CM | POA: Diagnosis not present

## 2024-03-03 DIAGNOSIS — M25512 Pain in left shoulder: Secondary | ICD-10-CM | POA: Diagnosis not present

## 2024-03-03 DIAGNOSIS — R5383 Other fatigue: Secondary | ICD-10-CM | POA: Diagnosis not present

## 2024-03-07 DIAGNOSIS — I130 Hypertensive heart and chronic kidney disease with heart failure and stage 1 through stage 4 chronic kidney disease, or unspecified chronic kidney disease: Secondary | ICD-10-CM | POA: Diagnosis not present

## 2024-03-07 DIAGNOSIS — L97821 Non-pressure chronic ulcer of other part of left lower leg limited to breakdown of skin: Secondary | ICD-10-CM | POA: Diagnosis not present

## 2024-03-07 DIAGNOSIS — I5032 Chronic diastolic (congestive) heart failure: Secondary | ICD-10-CM | POA: Diagnosis not present

## 2024-03-07 DIAGNOSIS — I83023 Varicose veins of left lower extremity with ulcer of ankle: Secondary | ICD-10-CM | POA: Diagnosis not present

## 2024-03-07 DIAGNOSIS — L97321 Non-pressure chronic ulcer of left ankle limited to breakdown of skin: Secondary | ICD-10-CM | POA: Diagnosis not present

## 2024-03-07 DIAGNOSIS — I83028 Varicose veins of left lower extremity with ulcer other part of lower leg: Secondary | ICD-10-CM | POA: Diagnosis not present

## 2024-03-09 DIAGNOSIS — L97321 Non-pressure chronic ulcer of left ankle limited to breakdown of skin: Secondary | ICD-10-CM | POA: Diagnosis not present

## 2024-03-09 DIAGNOSIS — L97821 Non-pressure chronic ulcer of other part of left lower leg limited to breakdown of skin: Secondary | ICD-10-CM | POA: Diagnosis not present

## 2024-03-09 DIAGNOSIS — I130 Hypertensive heart and chronic kidney disease with heart failure and stage 1 through stage 4 chronic kidney disease, or unspecified chronic kidney disease: Secondary | ICD-10-CM | POA: Diagnosis not present

## 2024-03-09 DIAGNOSIS — I5032 Chronic diastolic (congestive) heart failure: Secondary | ICD-10-CM | POA: Diagnosis not present

## 2024-03-09 DIAGNOSIS — I83023 Varicose veins of left lower extremity with ulcer of ankle: Secondary | ICD-10-CM | POA: Diagnosis not present

## 2024-03-09 DIAGNOSIS — I83028 Varicose veins of left lower extremity with ulcer other part of lower leg: Secondary | ICD-10-CM | POA: Diagnosis not present

## 2024-03-13 DIAGNOSIS — I5032 Chronic diastolic (congestive) heart failure: Secondary | ICD-10-CM | POA: Diagnosis not present

## 2024-03-13 DIAGNOSIS — I83028 Varicose veins of left lower extremity with ulcer other part of lower leg: Secondary | ICD-10-CM | POA: Diagnosis not present

## 2024-03-13 DIAGNOSIS — L97821 Non-pressure chronic ulcer of other part of left lower leg limited to breakdown of skin: Secondary | ICD-10-CM | POA: Diagnosis not present

## 2024-03-13 DIAGNOSIS — I130 Hypertensive heart and chronic kidney disease with heart failure and stage 1 through stage 4 chronic kidney disease, or unspecified chronic kidney disease: Secondary | ICD-10-CM | POA: Diagnosis not present

## 2024-03-13 DIAGNOSIS — L97321 Non-pressure chronic ulcer of left ankle limited to breakdown of skin: Secondary | ICD-10-CM | POA: Diagnosis not present

## 2024-03-13 DIAGNOSIS — I83023 Varicose veins of left lower extremity with ulcer of ankle: Secondary | ICD-10-CM | POA: Diagnosis not present

## 2024-03-16 DIAGNOSIS — L97821 Non-pressure chronic ulcer of other part of left lower leg limited to breakdown of skin: Secondary | ICD-10-CM | POA: Diagnosis not present

## 2024-03-16 DIAGNOSIS — I130 Hypertensive heart and chronic kidney disease with heart failure and stage 1 through stage 4 chronic kidney disease, or unspecified chronic kidney disease: Secondary | ICD-10-CM | POA: Diagnosis not present

## 2024-03-16 DIAGNOSIS — I83028 Varicose veins of left lower extremity with ulcer other part of lower leg: Secondary | ICD-10-CM | POA: Diagnosis not present

## 2024-03-16 DIAGNOSIS — I83023 Varicose veins of left lower extremity with ulcer of ankle: Secondary | ICD-10-CM | POA: Diagnosis not present

## 2024-03-16 DIAGNOSIS — L97321 Non-pressure chronic ulcer of left ankle limited to breakdown of skin: Secondary | ICD-10-CM | POA: Diagnosis not present

## 2024-03-16 DIAGNOSIS — I5032 Chronic diastolic (congestive) heart failure: Secondary | ICD-10-CM | POA: Diagnosis not present

## 2024-03-21 DIAGNOSIS — I130 Hypertensive heart and chronic kidney disease with heart failure and stage 1 through stage 4 chronic kidney disease, or unspecified chronic kidney disease: Secondary | ICD-10-CM | POA: Diagnosis not present

## 2024-03-21 DIAGNOSIS — I5032 Chronic diastolic (congestive) heart failure: Secondary | ICD-10-CM | POA: Diagnosis not present

## 2024-03-21 DIAGNOSIS — I83023 Varicose veins of left lower extremity with ulcer of ankle: Secondary | ICD-10-CM | POA: Diagnosis not present

## 2024-03-21 DIAGNOSIS — L97321 Non-pressure chronic ulcer of left ankle limited to breakdown of skin: Secondary | ICD-10-CM | POA: Diagnosis not present

## 2024-03-21 DIAGNOSIS — L97821 Non-pressure chronic ulcer of other part of left lower leg limited to breakdown of skin: Secondary | ICD-10-CM | POA: Diagnosis not present

## 2024-03-21 DIAGNOSIS — I83028 Varicose veins of left lower extremity with ulcer other part of lower leg: Secondary | ICD-10-CM | POA: Diagnosis not present

## 2024-03-23 DIAGNOSIS — I130 Hypertensive heart and chronic kidney disease with heart failure and stage 1 through stage 4 chronic kidney disease, or unspecified chronic kidney disease: Secondary | ICD-10-CM | POA: Diagnosis not present

## 2024-03-23 DIAGNOSIS — M109 Gout, unspecified: Secondary | ICD-10-CM | POA: Diagnosis not present

## 2024-03-23 DIAGNOSIS — I509 Heart failure, unspecified: Secondary | ICD-10-CM | POA: Diagnosis not present

## 2024-03-23 DIAGNOSIS — N1832 Chronic kidney disease, stage 3b: Secondary | ICD-10-CM | POA: Diagnosis not present

## 2024-03-23 DIAGNOSIS — E785 Hyperlipidemia, unspecified: Secondary | ICD-10-CM | POA: Diagnosis not present

## 2024-03-23 DIAGNOSIS — E7849 Other hyperlipidemia: Secondary | ICD-10-CM | POA: Diagnosis not present

## 2024-03-23 DIAGNOSIS — E1169 Type 2 diabetes mellitus with other specified complication: Secondary | ICD-10-CM | POA: Diagnosis not present

## 2024-03-24 DIAGNOSIS — L97321 Non-pressure chronic ulcer of left ankle limited to breakdown of skin: Secondary | ICD-10-CM | POA: Diagnosis not present

## 2024-03-24 DIAGNOSIS — L97821 Non-pressure chronic ulcer of other part of left lower leg limited to breakdown of skin: Secondary | ICD-10-CM | POA: Diagnosis not present

## 2024-03-24 DIAGNOSIS — I83023 Varicose veins of left lower extremity with ulcer of ankle: Secondary | ICD-10-CM | POA: Diagnosis not present

## 2024-03-24 DIAGNOSIS — I130 Hypertensive heart and chronic kidney disease with heart failure and stage 1 through stage 4 chronic kidney disease, or unspecified chronic kidney disease: Secondary | ICD-10-CM | POA: Diagnosis not present

## 2024-03-24 DIAGNOSIS — I83028 Varicose veins of left lower extremity with ulcer other part of lower leg: Secondary | ICD-10-CM | POA: Diagnosis not present

## 2024-03-24 DIAGNOSIS — I5032 Chronic diastolic (congestive) heart failure: Secondary | ICD-10-CM | POA: Diagnosis not present

## 2024-03-24 DIAGNOSIS — R946 Abnormal results of thyroid function studies: Secondary | ICD-10-CM | POA: Diagnosis not present

## 2024-03-26 DIAGNOSIS — Z7722 Contact with and (suspected) exposure to environmental tobacco smoke (acute) (chronic): Secondary | ICD-10-CM | POA: Diagnosis not present

## 2024-03-26 DIAGNOSIS — Z8673 Personal history of transient ischemic attack (TIA), and cerebral infarction without residual deficits: Secondary | ICD-10-CM | POA: Diagnosis not present

## 2024-03-26 DIAGNOSIS — Z9981 Dependence on supplemental oxygen: Secondary | ICD-10-CM | POA: Diagnosis not present

## 2024-03-26 DIAGNOSIS — L97821 Non-pressure chronic ulcer of other part of left lower leg limited to breakdown of skin: Secondary | ICD-10-CM | POA: Diagnosis not present

## 2024-03-26 DIAGNOSIS — Z7902 Long term (current) use of antithrombotics/antiplatelets: Secondary | ICD-10-CM | POA: Diagnosis not present

## 2024-03-26 DIAGNOSIS — Z6841 Body Mass Index (BMI) 40.0 and over, adult: Secondary | ICD-10-CM | POA: Diagnosis not present

## 2024-03-26 DIAGNOSIS — N189 Chronic kidney disease, unspecified: Secondary | ICD-10-CM | POA: Diagnosis not present

## 2024-03-26 DIAGNOSIS — M109 Gout, unspecified: Secondary | ICD-10-CM | POA: Diagnosis not present

## 2024-03-26 DIAGNOSIS — I83028 Varicose veins of left lower extremity with ulcer other part of lower leg: Secondary | ICD-10-CM | POA: Diagnosis not present

## 2024-03-26 DIAGNOSIS — E669 Obesity, unspecified: Secondary | ICD-10-CM | POA: Diagnosis not present

## 2024-03-26 DIAGNOSIS — K5900 Constipation, unspecified: Secondary | ICD-10-CM | POA: Diagnosis not present

## 2024-03-26 DIAGNOSIS — I130 Hypertensive heart and chronic kidney disease with heart failure and stage 1 through stage 4 chronic kidney disease, or unspecified chronic kidney disease: Secondary | ICD-10-CM | POA: Diagnosis not present

## 2024-03-26 DIAGNOSIS — I5032 Chronic diastolic (congestive) heart failure: Secondary | ICD-10-CM | POA: Diagnosis not present

## 2024-03-27 DIAGNOSIS — E669 Obesity, unspecified: Secondary | ICD-10-CM | POA: Diagnosis not present

## 2024-03-27 DIAGNOSIS — N189 Chronic kidney disease, unspecified: Secondary | ICD-10-CM | POA: Diagnosis not present

## 2024-03-27 DIAGNOSIS — I5032 Chronic diastolic (congestive) heart failure: Secondary | ICD-10-CM | POA: Diagnosis not present

## 2024-03-27 DIAGNOSIS — I83028 Varicose veins of left lower extremity with ulcer other part of lower leg: Secondary | ICD-10-CM | POA: Diagnosis not present

## 2024-03-27 DIAGNOSIS — I130 Hypertensive heart and chronic kidney disease with heart failure and stage 1 through stage 4 chronic kidney disease, or unspecified chronic kidney disease: Secondary | ICD-10-CM | POA: Diagnosis not present

## 2024-03-27 DIAGNOSIS — L97821 Non-pressure chronic ulcer of other part of left lower leg limited to breakdown of skin: Secondary | ICD-10-CM | POA: Diagnosis not present

## 2024-03-30 DIAGNOSIS — L97821 Non-pressure chronic ulcer of other part of left lower leg limited to breakdown of skin: Secondary | ICD-10-CM | POA: Diagnosis not present

## 2024-03-30 DIAGNOSIS — N189 Chronic kidney disease, unspecified: Secondary | ICD-10-CM | POA: Diagnosis not present

## 2024-03-30 DIAGNOSIS — E669 Obesity, unspecified: Secondary | ICD-10-CM | POA: Diagnosis not present

## 2024-03-30 DIAGNOSIS — I130 Hypertensive heart and chronic kidney disease with heart failure and stage 1 through stage 4 chronic kidney disease, or unspecified chronic kidney disease: Secondary | ICD-10-CM | POA: Diagnosis not present

## 2024-03-30 DIAGNOSIS — I5032 Chronic diastolic (congestive) heart failure: Secondary | ICD-10-CM | POA: Diagnosis not present

## 2024-03-30 DIAGNOSIS — I83028 Varicose veins of left lower extremity with ulcer other part of lower leg: Secondary | ICD-10-CM | POA: Diagnosis not present

## 2024-04-04 DIAGNOSIS — H3530 Unspecified macular degeneration: Secondary | ICD-10-CM | POA: Diagnosis not present

## 2024-04-04 DIAGNOSIS — E669 Obesity, unspecified: Secondary | ICD-10-CM | POA: Diagnosis not present

## 2024-04-04 DIAGNOSIS — L97821 Non-pressure chronic ulcer of other part of left lower leg limited to breakdown of skin: Secondary | ICD-10-CM | POA: Diagnosis not present

## 2024-04-04 DIAGNOSIS — L989 Disorder of the skin and subcutaneous tissue, unspecified: Secondary | ICD-10-CM | POA: Diagnosis not present

## 2024-04-04 DIAGNOSIS — E1122 Type 2 diabetes mellitus with diabetic chronic kidney disease: Secondary | ICD-10-CM | POA: Diagnosis not present

## 2024-04-04 DIAGNOSIS — R82998 Other abnormal findings in urine: Secondary | ICD-10-CM | POA: Diagnosis not present

## 2024-04-04 DIAGNOSIS — L309 Dermatitis, unspecified: Secondary | ICD-10-CM | POA: Diagnosis not present

## 2024-04-04 DIAGNOSIS — N189 Chronic kidney disease, unspecified: Secondary | ICD-10-CM | POA: Diagnosis not present

## 2024-04-04 DIAGNOSIS — I87329 Chronic venous hypertension (idiopathic) with inflammation of unspecified lower extremity: Secondary | ICD-10-CM | POA: Diagnosis not present

## 2024-04-04 DIAGNOSIS — I83028 Varicose veins of left lower extremity with ulcer other part of lower leg: Secondary | ICD-10-CM | POA: Diagnosis not present

## 2024-04-04 DIAGNOSIS — I130 Hypertensive heart and chronic kidney disease with heart failure and stage 1 through stage 4 chronic kidney disease, or unspecified chronic kidney disease: Secondary | ICD-10-CM | POA: Diagnosis not present

## 2024-04-04 DIAGNOSIS — I509 Heart failure, unspecified: Secondary | ICD-10-CM | POA: Diagnosis not present

## 2024-04-04 DIAGNOSIS — R490 Dysphonia: Secondary | ICD-10-CM | POA: Diagnosis not present

## 2024-04-04 DIAGNOSIS — M109 Gout, unspecified: Secondary | ICD-10-CM | POA: Diagnosis not present

## 2024-04-04 DIAGNOSIS — I5032 Chronic diastolic (congestive) heart failure: Secondary | ICD-10-CM | POA: Diagnosis not present

## 2024-04-04 DIAGNOSIS — N1832 Chronic kidney disease, stage 3b: Secondary | ICD-10-CM | POA: Diagnosis not present

## 2024-04-04 DIAGNOSIS — F331 Major depressive disorder, recurrent, moderate: Secondary | ICD-10-CM | POA: Diagnosis not present

## 2024-04-07 DIAGNOSIS — N189 Chronic kidney disease, unspecified: Secondary | ICD-10-CM | POA: Diagnosis not present

## 2024-04-07 DIAGNOSIS — I5032 Chronic diastolic (congestive) heart failure: Secondary | ICD-10-CM | POA: Diagnosis not present

## 2024-04-07 DIAGNOSIS — L97821 Non-pressure chronic ulcer of other part of left lower leg limited to breakdown of skin: Secondary | ICD-10-CM | POA: Diagnosis not present

## 2024-04-07 DIAGNOSIS — I130 Hypertensive heart and chronic kidney disease with heart failure and stage 1 through stage 4 chronic kidney disease, or unspecified chronic kidney disease: Secondary | ICD-10-CM | POA: Diagnosis not present

## 2024-04-07 DIAGNOSIS — E669 Obesity, unspecified: Secondary | ICD-10-CM | POA: Diagnosis not present

## 2024-04-07 DIAGNOSIS — I83028 Varicose veins of left lower extremity with ulcer other part of lower leg: Secondary | ICD-10-CM | POA: Diagnosis not present

## 2024-04-10 DIAGNOSIS — Z8673 Personal history of transient ischemic attack (TIA), and cerebral infarction without residual deficits: Secondary | ICD-10-CM | POA: Diagnosis not present

## 2024-04-10 DIAGNOSIS — E669 Obesity, unspecified: Secondary | ICD-10-CM | POA: Diagnosis not present

## 2024-04-10 DIAGNOSIS — Z9981 Dependence on supplemental oxygen: Secondary | ICD-10-CM | POA: Diagnosis not present

## 2024-04-10 DIAGNOSIS — N189 Chronic kidney disease, unspecified: Secondary | ICD-10-CM | POA: Diagnosis not present

## 2024-04-10 DIAGNOSIS — Z6841 Body Mass Index (BMI) 40.0 and over, adult: Secondary | ICD-10-CM | POA: Diagnosis not present

## 2024-04-10 DIAGNOSIS — K5900 Constipation, unspecified: Secondary | ICD-10-CM | POA: Diagnosis not present

## 2024-04-10 DIAGNOSIS — Z7722 Contact with and (suspected) exposure to environmental tobacco smoke (acute) (chronic): Secondary | ICD-10-CM | POA: Diagnosis not present

## 2024-04-10 DIAGNOSIS — M109 Gout, unspecified: Secondary | ICD-10-CM | POA: Diagnosis not present

## 2024-04-10 DIAGNOSIS — I5032 Chronic diastolic (congestive) heart failure: Secondary | ICD-10-CM | POA: Diagnosis not present

## 2024-04-10 DIAGNOSIS — I130 Hypertensive heart and chronic kidney disease with heart failure and stage 1 through stage 4 chronic kidney disease, or unspecified chronic kidney disease: Secondary | ICD-10-CM | POA: Diagnosis not present

## 2024-04-10 DIAGNOSIS — L97821 Non-pressure chronic ulcer of other part of left lower leg limited to breakdown of skin: Secondary | ICD-10-CM | POA: Diagnosis not present

## 2024-04-10 DIAGNOSIS — I83028 Varicose veins of left lower extremity with ulcer other part of lower leg: Secondary | ICD-10-CM | POA: Diagnosis not present

## 2024-04-11 DIAGNOSIS — H353221 Exudative age-related macular degeneration, left eye, with active choroidal neovascularization: Secondary | ICD-10-CM | POA: Diagnosis not present

## 2024-04-13 DIAGNOSIS — I130 Hypertensive heart and chronic kidney disease with heart failure and stage 1 through stage 4 chronic kidney disease, or unspecified chronic kidney disease: Secondary | ICD-10-CM | POA: Diagnosis not present

## 2024-04-13 DIAGNOSIS — N189 Chronic kidney disease, unspecified: Secondary | ICD-10-CM | POA: Diagnosis not present

## 2024-04-13 DIAGNOSIS — E669 Obesity, unspecified: Secondary | ICD-10-CM | POA: Diagnosis not present

## 2024-04-13 DIAGNOSIS — L97821 Non-pressure chronic ulcer of other part of left lower leg limited to breakdown of skin: Secondary | ICD-10-CM | POA: Diagnosis not present

## 2024-04-13 DIAGNOSIS — I83028 Varicose veins of left lower extremity with ulcer other part of lower leg: Secondary | ICD-10-CM | POA: Diagnosis not present

## 2024-04-13 DIAGNOSIS — I5032 Chronic diastolic (congestive) heart failure: Secondary | ICD-10-CM | POA: Diagnosis not present

## 2024-04-18 DIAGNOSIS — E669 Obesity, unspecified: Secondary | ICD-10-CM | POA: Diagnosis not present

## 2024-04-18 DIAGNOSIS — L97821 Non-pressure chronic ulcer of other part of left lower leg limited to breakdown of skin: Secondary | ICD-10-CM | POA: Diagnosis not present

## 2024-04-18 DIAGNOSIS — I5032 Chronic diastolic (congestive) heart failure: Secondary | ICD-10-CM | POA: Diagnosis not present

## 2024-04-18 DIAGNOSIS — I130 Hypertensive heart and chronic kidney disease with heart failure and stage 1 through stage 4 chronic kidney disease, or unspecified chronic kidney disease: Secondary | ICD-10-CM | POA: Diagnosis not present

## 2024-04-18 DIAGNOSIS — N189 Chronic kidney disease, unspecified: Secondary | ICD-10-CM | POA: Diagnosis not present

## 2024-04-18 DIAGNOSIS — I83028 Varicose veins of left lower extremity with ulcer other part of lower leg: Secondary | ICD-10-CM | POA: Diagnosis not present

## 2024-04-21 DIAGNOSIS — I130 Hypertensive heart and chronic kidney disease with heart failure and stage 1 through stage 4 chronic kidney disease, or unspecified chronic kidney disease: Secondary | ICD-10-CM | POA: Diagnosis not present

## 2024-04-21 DIAGNOSIS — N189 Chronic kidney disease, unspecified: Secondary | ICD-10-CM | POA: Diagnosis not present

## 2024-04-21 DIAGNOSIS — L97821 Non-pressure chronic ulcer of other part of left lower leg limited to breakdown of skin: Secondary | ICD-10-CM | POA: Diagnosis not present

## 2024-04-21 DIAGNOSIS — I5032 Chronic diastolic (congestive) heart failure: Secondary | ICD-10-CM | POA: Diagnosis not present

## 2024-04-21 DIAGNOSIS — E669 Obesity, unspecified: Secondary | ICD-10-CM | POA: Diagnosis not present

## 2024-04-21 DIAGNOSIS — I83028 Varicose veins of left lower extremity with ulcer other part of lower leg: Secondary | ICD-10-CM | POA: Diagnosis not present

## 2024-04-25 DIAGNOSIS — I130 Hypertensive heart and chronic kidney disease with heart failure and stage 1 through stage 4 chronic kidney disease, or unspecified chronic kidney disease: Secondary | ICD-10-CM | POA: Diagnosis not present

## 2024-04-25 DIAGNOSIS — L97821 Non-pressure chronic ulcer of other part of left lower leg limited to breakdown of skin: Secondary | ICD-10-CM | POA: Diagnosis not present

## 2024-04-25 DIAGNOSIS — K5900 Constipation, unspecified: Secondary | ICD-10-CM | POA: Diagnosis not present

## 2024-04-25 DIAGNOSIS — E669 Obesity, unspecified: Secondary | ICD-10-CM | POA: Diagnosis not present

## 2024-04-25 DIAGNOSIS — I83028 Varicose veins of left lower extremity with ulcer other part of lower leg: Secondary | ICD-10-CM | POA: Diagnosis not present

## 2024-04-25 DIAGNOSIS — Z7722 Contact with and (suspected) exposure to environmental tobacco smoke (acute) (chronic): Secondary | ICD-10-CM | POA: Diagnosis not present

## 2024-04-25 DIAGNOSIS — I5032 Chronic diastolic (congestive) heart failure: Secondary | ICD-10-CM | POA: Diagnosis not present

## 2024-04-25 DIAGNOSIS — Z6841 Body Mass Index (BMI) 40.0 and over, adult: Secondary | ICD-10-CM | POA: Diagnosis not present

## 2024-04-25 DIAGNOSIS — Z9981 Dependence on supplemental oxygen: Secondary | ICD-10-CM | POA: Diagnosis not present

## 2024-04-25 DIAGNOSIS — Z7902 Long term (current) use of antithrombotics/antiplatelets: Secondary | ICD-10-CM | POA: Diagnosis not present

## 2024-04-25 DIAGNOSIS — N189 Chronic kidney disease, unspecified: Secondary | ICD-10-CM | POA: Diagnosis not present

## 2024-04-25 DIAGNOSIS — M109 Gout, unspecified: Secondary | ICD-10-CM | POA: Diagnosis not present

## 2024-04-25 DIAGNOSIS — Z8673 Personal history of transient ischemic attack (TIA), and cerebral infarction without residual deficits: Secondary | ICD-10-CM | POA: Diagnosis not present

## 2024-04-28 DIAGNOSIS — I130 Hypertensive heart and chronic kidney disease with heart failure and stage 1 through stage 4 chronic kidney disease, or unspecified chronic kidney disease: Secondary | ICD-10-CM | POA: Diagnosis not present

## 2024-04-28 DIAGNOSIS — E669 Obesity, unspecified: Secondary | ICD-10-CM | POA: Diagnosis not present

## 2024-04-28 DIAGNOSIS — L97821 Non-pressure chronic ulcer of other part of left lower leg limited to breakdown of skin: Secondary | ICD-10-CM | POA: Diagnosis not present

## 2024-04-28 DIAGNOSIS — I83028 Varicose veins of left lower extremity with ulcer other part of lower leg: Secondary | ICD-10-CM | POA: Diagnosis not present

## 2024-04-28 DIAGNOSIS — N189 Chronic kidney disease, unspecified: Secondary | ICD-10-CM | POA: Diagnosis not present

## 2024-04-28 DIAGNOSIS — I5032 Chronic diastolic (congestive) heart failure: Secondary | ICD-10-CM | POA: Diagnosis not present

## 2024-05-01 DIAGNOSIS — I83028 Varicose veins of left lower extremity with ulcer other part of lower leg: Secondary | ICD-10-CM | POA: Diagnosis not present

## 2024-05-01 DIAGNOSIS — E669 Obesity, unspecified: Secondary | ICD-10-CM | POA: Diagnosis not present

## 2024-05-01 DIAGNOSIS — L97821 Non-pressure chronic ulcer of other part of left lower leg limited to breakdown of skin: Secondary | ICD-10-CM | POA: Diagnosis not present

## 2024-05-01 DIAGNOSIS — I130 Hypertensive heart and chronic kidney disease with heart failure and stage 1 through stage 4 chronic kidney disease, or unspecified chronic kidney disease: Secondary | ICD-10-CM | POA: Diagnosis not present

## 2024-05-01 DIAGNOSIS — I5032 Chronic diastolic (congestive) heart failure: Secondary | ICD-10-CM | POA: Diagnosis not present

## 2024-05-01 DIAGNOSIS — N189 Chronic kidney disease, unspecified: Secondary | ICD-10-CM | POA: Diagnosis not present

## 2024-05-04 DIAGNOSIS — I130 Hypertensive heart and chronic kidney disease with heart failure and stage 1 through stage 4 chronic kidney disease, or unspecified chronic kidney disease: Secondary | ICD-10-CM | POA: Diagnosis not present

## 2024-05-04 DIAGNOSIS — I5032 Chronic diastolic (congestive) heart failure: Secondary | ICD-10-CM | POA: Diagnosis not present

## 2024-05-04 DIAGNOSIS — L97821 Non-pressure chronic ulcer of other part of left lower leg limited to breakdown of skin: Secondary | ICD-10-CM | POA: Diagnosis not present

## 2024-05-04 DIAGNOSIS — I83028 Varicose veins of left lower extremity with ulcer other part of lower leg: Secondary | ICD-10-CM | POA: Diagnosis not present

## 2024-05-04 DIAGNOSIS — E669 Obesity, unspecified: Secondary | ICD-10-CM | POA: Diagnosis not present

## 2024-05-04 DIAGNOSIS — N189 Chronic kidney disease, unspecified: Secondary | ICD-10-CM | POA: Diagnosis not present

## 2024-05-08 DIAGNOSIS — I130 Hypertensive heart and chronic kidney disease with heart failure and stage 1 through stage 4 chronic kidney disease, or unspecified chronic kidney disease: Secondary | ICD-10-CM | POA: Diagnosis not present

## 2024-05-08 DIAGNOSIS — I5032 Chronic diastolic (congestive) heart failure: Secondary | ICD-10-CM | POA: Diagnosis not present

## 2024-05-08 DIAGNOSIS — L97821 Non-pressure chronic ulcer of other part of left lower leg limited to breakdown of skin: Secondary | ICD-10-CM | POA: Diagnosis not present

## 2024-05-08 DIAGNOSIS — N189 Chronic kidney disease, unspecified: Secondary | ICD-10-CM | POA: Diagnosis not present

## 2024-05-08 DIAGNOSIS — E669 Obesity, unspecified: Secondary | ICD-10-CM | POA: Diagnosis not present

## 2024-05-08 DIAGNOSIS — I83028 Varicose veins of left lower extremity with ulcer other part of lower leg: Secondary | ICD-10-CM | POA: Diagnosis not present

## 2024-05-11 DIAGNOSIS — I5032 Chronic diastolic (congestive) heart failure: Secondary | ICD-10-CM | POA: Diagnosis not present

## 2024-05-11 DIAGNOSIS — L97821 Non-pressure chronic ulcer of other part of left lower leg limited to breakdown of skin: Secondary | ICD-10-CM | POA: Diagnosis not present

## 2024-05-11 DIAGNOSIS — I83028 Varicose veins of left lower extremity with ulcer other part of lower leg: Secondary | ICD-10-CM | POA: Diagnosis not present

## 2024-05-11 DIAGNOSIS — E669 Obesity, unspecified: Secondary | ICD-10-CM | POA: Diagnosis not present

## 2024-05-11 DIAGNOSIS — I130 Hypertensive heart and chronic kidney disease with heart failure and stage 1 through stage 4 chronic kidney disease, or unspecified chronic kidney disease: Secondary | ICD-10-CM | POA: Diagnosis not present

## 2024-05-11 DIAGNOSIS — N189 Chronic kidney disease, unspecified: Secondary | ICD-10-CM | POA: Diagnosis not present

## 2024-05-16 DIAGNOSIS — L97821 Non-pressure chronic ulcer of other part of left lower leg limited to breakdown of skin: Secondary | ICD-10-CM | POA: Diagnosis not present

## 2024-05-16 DIAGNOSIS — I130 Hypertensive heart and chronic kidney disease with heart failure and stage 1 through stage 4 chronic kidney disease, or unspecified chronic kidney disease: Secondary | ICD-10-CM | POA: Diagnosis not present

## 2024-05-16 DIAGNOSIS — E669 Obesity, unspecified: Secondary | ICD-10-CM | POA: Diagnosis not present

## 2024-05-16 DIAGNOSIS — N189 Chronic kidney disease, unspecified: Secondary | ICD-10-CM | POA: Diagnosis not present

## 2024-05-16 DIAGNOSIS — I5032 Chronic diastolic (congestive) heart failure: Secondary | ICD-10-CM | POA: Diagnosis not present

## 2024-05-16 DIAGNOSIS — I83028 Varicose veins of left lower extremity with ulcer other part of lower leg: Secondary | ICD-10-CM | POA: Diagnosis not present

## 2024-05-19 DIAGNOSIS — L97821 Non-pressure chronic ulcer of other part of left lower leg limited to breakdown of skin: Secondary | ICD-10-CM | POA: Diagnosis not present

## 2024-05-19 DIAGNOSIS — I130 Hypertensive heart and chronic kidney disease with heart failure and stage 1 through stage 4 chronic kidney disease, or unspecified chronic kidney disease: Secondary | ICD-10-CM | POA: Diagnosis not present

## 2024-05-19 DIAGNOSIS — N189 Chronic kidney disease, unspecified: Secondary | ICD-10-CM | POA: Diagnosis not present

## 2024-05-19 DIAGNOSIS — E669 Obesity, unspecified: Secondary | ICD-10-CM | POA: Diagnosis not present

## 2024-05-19 DIAGNOSIS — I5032 Chronic diastolic (congestive) heart failure: Secondary | ICD-10-CM | POA: Diagnosis not present

## 2024-05-19 DIAGNOSIS — I83028 Varicose veins of left lower extremity with ulcer other part of lower leg: Secondary | ICD-10-CM | POA: Diagnosis not present

## 2024-05-22 DIAGNOSIS — E669 Obesity, unspecified: Secondary | ICD-10-CM | POA: Diagnosis not present

## 2024-05-22 DIAGNOSIS — L97821 Non-pressure chronic ulcer of other part of left lower leg limited to breakdown of skin: Secondary | ICD-10-CM | POA: Diagnosis not present

## 2024-05-22 DIAGNOSIS — I130 Hypertensive heart and chronic kidney disease with heart failure and stage 1 through stage 4 chronic kidney disease, or unspecified chronic kidney disease: Secondary | ICD-10-CM | POA: Diagnosis not present

## 2024-05-22 DIAGNOSIS — I5032 Chronic diastolic (congestive) heart failure: Secondary | ICD-10-CM | POA: Diagnosis not present

## 2024-05-22 DIAGNOSIS — I83028 Varicose veins of left lower extremity with ulcer other part of lower leg: Secondary | ICD-10-CM | POA: Diagnosis not present

## 2024-05-22 DIAGNOSIS — N189 Chronic kidney disease, unspecified: Secondary | ICD-10-CM | POA: Diagnosis not present

## 2024-05-25 DIAGNOSIS — M109 Gout, unspecified: Secondary | ICD-10-CM | POA: Diagnosis not present

## 2024-05-25 DIAGNOSIS — Z6841 Body Mass Index (BMI) 40.0 and over, adult: Secondary | ICD-10-CM | POA: Diagnosis not present

## 2024-05-25 DIAGNOSIS — E669 Obesity, unspecified: Secondary | ICD-10-CM | POA: Diagnosis not present

## 2024-05-25 DIAGNOSIS — I5189 Other ill-defined heart diseases: Secondary | ICD-10-CM | POA: Diagnosis not present

## 2024-05-25 DIAGNOSIS — K5900 Constipation, unspecified: Secondary | ICD-10-CM | POA: Diagnosis not present

## 2024-05-25 DIAGNOSIS — I130 Hypertensive heart and chronic kidney disease with heart failure and stage 1 through stage 4 chronic kidney disease, or unspecified chronic kidney disease: Secondary | ICD-10-CM | POA: Diagnosis not present

## 2024-05-25 DIAGNOSIS — F331 Major depressive disorder, recurrent, moderate: Secondary | ICD-10-CM | POA: Diagnosis not present

## 2024-05-25 DIAGNOSIS — N1832 Chronic kidney disease, stage 3b: Secondary | ICD-10-CM | POA: Diagnosis not present

## 2024-05-25 DIAGNOSIS — I5032 Chronic diastolic (congestive) heart failure: Secondary | ICD-10-CM | POA: Diagnosis not present

## 2024-05-25 DIAGNOSIS — Z9981 Dependence on supplemental oxygen: Secondary | ICD-10-CM | POA: Diagnosis not present

## 2024-05-25 DIAGNOSIS — Z7902 Long term (current) use of antithrombotics/antiplatelets: Secondary | ICD-10-CM | POA: Diagnosis not present

## 2024-05-25 DIAGNOSIS — Z7722 Contact with and (suspected) exposure to environmental tobacco smoke (acute) (chronic): Secondary | ICD-10-CM | POA: Diagnosis not present

## 2024-05-25 DIAGNOSIS — I87323 Chronic venous hypertension (idiopathic) with inflammation of bilateral lower extremity: Secondary | ICD-10-CM | POA: Diagnosis not present

## 2024-05-25 DIAGNOSIS — Z8673 Personal history of transient ischemic attack (TIA), and cerebral infarction without residual deficits: Secondary | ICD-10-CM | POA: Diagnosis not present

## 2024-05-29 DIAGNOSIS — I87323 Chronic venous hypertension (idiopathic) with inflammation of bilateral lower extremity: Secondary | ICD-10-CM | POA: Diagnosis not present

## 2024-05-29 DIAGNOSIS — I130 Hypertensive heart and chronic kidney disease with heart failure and stage 1 through stage 4 chronic kidney disease, or unspecified chronic kidney disease: Secondary | ICD-10-CM | POA: Diagnosis not present

## 2024-05-29 DIAGNOSIS — F331 Major depressive disorder, recurrent, moderate: Secondary | ICD-10-CM | POA: Diagnosis not present

## 2024-05-29 DIAGNOSIS — I5189 Other ill-defined heart diseases: Secondary | ICD-10-CM | POA: Diagnosis not present

## 2024-05-29 DIAGNOSIS — N1832 Chronic kidney disease, stage 3b: Secondary | ICD-10-CM | POA: Diagnosis not present

## 2024-05-29 DIAGNOSIS — I5032 Chronic diastolic (congestive) heart failure: Secondary | ICD-10-CM | POA: Diagnosis not present

## 2024-06-05 DIAGNOSIS — I5032 Chronic diastolic (congestive) heart failure: Secondary | ICD-10-CM | POA: Diagnosis not present

## 2024-06-05 DIAGNOSIS — I130 Hypertensive heart and chronic kidney disease with heart failure and stage 1 through stage 4 chronic kidney disease, or unspecified chronic kidney disease: Secondary | ICD-10-CM | POA: Diagnosis not present

## 2024-06-05 DIAGNOSIS — F331 Major depressive disorder, recurrent, moderate: Secondary | ICD-10-CM | POA: Diagnosis not present

## 2024-06-05 DIAGNOSIS — N1832 Chronic kidney disease, stage 3b: Secondary | ICD-10-CM | POA: Diagnosis not present

## 2024-06-05 DIAGNOSIS — I5189 Other ill-defined heart diseases: Secondary | ICD-10-CM | POA: Diagnosis not present

## 2024-06-05 DIAGNOSIS — I87323 Chronic venous hypertension (idiopathic) with inflammation of bilateral lower extremity: Secondary | ICD-10-CM | POA: Diagnosis not present

## 2024-06-07 DIAGNOSIS — H353221 Exudative age-related macular degeneration, left eye, with active choroidal neovascularization: Secondary | ICD-10-CM | POA: Diagnosis not present

## 2024-06-08 DIAGNOSIS — I87323 Chronic venous hypertension (idiopathic) with inflammation of bilateral lower extremity: Secondary | ICD-10-CM | POA: Diagnosis not present

## 2024-06-08 DIAGNOSIS — N1832 Chronic kidney disease, stage 3b: Secondary | ICD-10-CM | POA: Diagnosis not present

## 2024-06-08 DIAGNOSIS — I5032 Chronic diastolic (congestive) heart failure: Secondary | ICD-10-CM | POA: Diagnosis not present

## 2024-06-08 DIAGNOSIS — I130 Hypertensive heart and chronic kidney disease with heart failure and stage 1 through stage 4 chronic kidney disease, or unspecified chronic kidney disease: Secondary | ICD-10-CM | POA: Diagnosis not present

## 2024-06-08 DIAGNOSIS — F331 Major depressive disorder, recurrent, moderate: Secondary | ICD-10-CM | POA: Diagnosis not present

## 2024-06-08 DIAGNOSIS — I5189 Other ill-defined heart diseases: Secondary | ICD-10-CM | POA: Diagnosis not present

## 2024-06-13 DIAGNOSIS — I5032 Chronic diastolic (congestive) heart failure: Secondary | ICD-10-CM | POA: Diagnosis not present

## 2024-06-13 DIAGNOSIS — I5189 Other ill-defined heart diseases: Secondary | ICD-10-CM | POA: Diagnosis not present

## 2024-06-13 DIAGNOSIS — F331 Major depressive disorder, recurrent, moderate: Secondary | ICD-10-CM | POA: Diagnosis not present

## 2024-06-13 DIAGNOSIS — I130 Hypertensive heart and chronic kidney disease with heart failure and stage 1 through stage 4 chronic kidney disease, or unspecified chronic kidney disease: Secondary | ICD-10-CM | POA: Diagnosis not present

## 2024-06-13 DIAGNOSIS — I87323 Chronic venous hypertension (idiopathic) with inflammation of bilateral lower extremity: Secondary | ICD-10-CM | POA: Diagnosis not present

## 2024-06-13 DIAGNOSIS — N1832 Chronic kidney disease, stage 3b: Secondary | ICD-10-CM | POA: Diagnosis not present

## 2024-06-16 DIAGNOSIS — F331 Major depressive disorder, recurrent, moderate: Secondary | ICD-10-CM | POA: Diagnosis not present

## 2024-06-16 DIAGNOSIS — I130 Hypertensive heart and chronic kidney disease with heart failure and stage 1 through stage 4 chronic kidney disease, or unspecified chronic kidney disease: Secondary | ICD-10-CM | POA: Diagnosis not present

## 2024-06-16 DIAGNOSIS — I5032 Chronic diastolic (congestive) heart failure: Secondary | ICD-10-CM | POA: Diagnosis not present

## 2024-06-16 DIAGNOSIS — I5189 Other ill-defined heart diseases: Secondary | ICD-10-CM | POA: Diagnosis not present

## 2024-06-16 DIAGNOSIS — N1832 Chronic kidney disease, stage 3b: Secondary | ICD-10-CM | POA: Diagnosis not present

## 2024-06-16 DIAGNOSIS — I87323 Chronic venous hypertension (idiopathic) with inflammation of bilateral lower extremity: Secondary | ICD-10-CM | POA: Diagnosis not present

## 2024-06-19 DIAGNOSIS — I130 Hypertensive heart and chronic kidney disease with heart failure and stage 1 through stage 4 chronic kidney disease, or unspecified chronic kidney disease: Secondary | ICD-10-CM | POA: Diagnosis not present

## 2024-06-19 DIAGNOSIS — I5189 Other ill-defined heart diseases: Secondary | ICD-10-CM | POA: Diagnosis not present

## 2024-06-19 DIAGNOSIS — N1832 Chronic kidney disease, stage 3b: Secondary | ICD-10-CM | POA: Diagnosis not present

## 2024-06-19 DIAGNOSIS — I87323 Chronic venous hypertension (idiopathic) with inflammation of bilateral lower extremity: Secondary | ICD-10-CM | POA: Diagnosis not present

## 2024-06-19 DIAGNOSIS — F331 Major depressive disorder, recurrent, moderate: Secondary | ICD-10-CM | POA: Diagnosis not present

## 2024-06-19 DIAGNOSIS — I5032 Chronic diastolic (congestive) heart failure: Secondary | ICD-10-CM | POA: Diagnosis not present

## 2024-06-22 DIAGNOSIS — I87323 Chronic venous hypertension (idiopathic) with inflammation of bilateral lower extremity: Secondary | ICD-10-CM | POA: Diagnosis not present

## 2024-06-22 DIAGNOSIS — I5032 Chronic diastolic (congestive) heart failure: Secondary | ICD-10-CM | POA: Diagnosis not present

## 2024-06-22 DIAGNOSIS — I130 Hypertensive heart and chronic kidney disease with heart failure and stage 1 through stage 4 chronic kidney disease, or unspecified chronic kidney disease: Secondary | ICD-10-CM | POA: Diagnosis not present

## 2024-06-22 DIAGNOSIS — F331 Major depressive disorder, recurrent, moderate: Secondary | ICD-10-CM | POA: Diagnosis not present

## 2024-06-22 DIAGNOSIS — N1832 Chronic kidney disease, stage 3b: Secondary | ICD-10-CM | POA: Diagnosis not present

## 2024-06-22 DIAGNOSIS — I5189 Other ill-defined heart diseases: Secondary | ICD-10-CM | POA: Diagnosis not present

## 2024-06-24 DIAGNOSIS — I130 Hypertensive heart and chronic kidney disease with heart failure and stage 1 through stage 4 chronic kidney disease, or unspecified chronic kidney disease: Secondary | ICD-10-CM | POA: Diagnosis not present

## 2024-06-24 DIAGNOSIS — E669 Obesity, unspecified: Secondary | ICD-10-CM | POA: Diagnosis not present

## 2024-06-24 DIAGNOSIS — Z7722 Contact with and (suspected) exposure to environmental tobacco smoke (acute) (chronic): Secondary | ICD-10-CM | POA: Diagnosis not present

## 2024-06-24 DIAGNOSIS — I5032 Chronic diastolic (congestive) heart failure: Secondary | ICD-10-CM | POA: Diagnosis not present

## 2024-06-24 DIAGNOSIS — F331 Major depressive disorder, recurrent, moderate: Secondary | ICD-10-CM | POA: Diagnosis not present

## 2024-06-24 DIAGNOSIS — Z8673 Personal history of transient ischemic attack (TIA), and cerebral infarction without residual deficits: Secondary | ICD-10-CM | POA: Diagnosis not present

## 2024-06-24 DIAGNOSIS — M109 Gout, unspecified: Secondary | ICD-10-CM | POA: Diagnosis not present

## 2024-06-24 DIAGNOSIS — Z9981 Dependence on supplemental oxygen: Secondary | ICD-10-CM | POA: Diagnosis not present

## 2024-06-24 DIAGNOSIS — I87323 Chronic venous hypertension (idiopathic) with inflammation of bilateral lower extremity: Secondary | ICD-10-CM | POA: Diagnosis not present

## 2024-06-24 DIAGNOSIS — I5189 Other ill-defined heart diseases: Secondary | ICD-10-CM | POA: Diagnosis not present

## 2024-06-24 DIAGNOSIS — Z6841 Body Mass Index (BMI) 40.0 and over, adult: Secondary | ICD-10-CM | POA: Diagnosis not present

## 2024-06-24 DIAGNOSIS — Z7902 Long term (current) use of antithrombotics/antiplatelets: Secondary | ICD-10-CM | POA: Diagnosis not present

## 2024-06-24 DIAGNOSIS — K5900 Constipation, unspecified: Secondary | ICD-10-CM | POA: Diagnosis not present

## 2024-06-24 DIAGNOSIS — N1832 Chronic kidney disease, stage 3b: Secondary | ICD-10-CM | POA: Diagnosis not present

## 2024-06-26 DIAGNOSIS — F331 Major depressive disorder, recurrent, moderate: Secondary | ICD-10-CM | POA: Diagnosis not present

## 2024-06-26 DIAGNOSIS — M109 Gout, unspecified: Secondary | ICD-10-CM | POA: Diagnosis not present

## 2024-06-26 DIAGNOSIS — Z6841 Body Mass Index (BMI) 40.0 and over, adult: Secondary | ICD-10-CM | POA: Diagnosis not present

## 2024-06-26 DIAGNOSIS — Z8673 Personal history of transient ischemic attack (TIA), and cerebral infarction without residual deficits: Secondary | ICD-10-CM | POA: Diagnosis not present

## 2024-06-26 DIAGNOSIS — E669 Obesity, unspecified: Secondary | ICD-10-CM | POA: Diagnosis not present

## 2024-06-26 DIAGNOSIS — Z7722 Contact with and (suspected) exposure to environmental tobacco smoke (acute) (chronic): Secondary | ICD-10-CM | POA: Diagnosis not present

## 2024-06-26 DIAGNOSIS — I5189 Other ill-defined heart diseases: Secondary | ICD-10-CM | POA: Diagnosis not present

## 2024-06-26 DIAGNOSIS — N1832 Chronic kidney disease, stage 3b: Secondary | ICD-10-CM | POA: Diagnosis not present

## 2024-06-26 DIAGNOSIS — I87323 Chronic venous hypertension (idiopathic) with inflammation of bilateral lower extremity: Secondary | ICD-10-CM | POA: Diagnosis not present

## 2024-06-26 DIAGNOSIS — K5900 Constipation, unspecified: Secondary | ICD-10-CM | POA: Diagnosis not present

## 2024-06-26 DIAGNOSIS — I130 Hypertensive heart and chronic kidney disease with heart failure and stage 1 through stage 4 chronic kidney disease, or unspecified chronic kidney disease: Secondary | ICD-10-CM | POA: Diagnosis not present

## 2024-06-26 DIAGNOSIS — I5032 Chronic diastolic (congestive) heart failure: Secondary | ICD-10-CM | POA: Diagnosis not present

## 2024-06-27 DIAGNOSIS — I87323 Chronic venous hypertension (idiopathic) with inflammation of bilateral lower extremity: Secondary | ICD-10-CM | POA: Diagnosis not present

## 2024-06-27 DIAGNOSIS — I5032 Chronic diastolic (congestive) heart failure: Secondary | ICD-10-CM | POA: Diagnosis not present

## 2024-06-27 DIAGNOSIS — I130 Hypertensive heart and chronic kidney disease with heart failure and stage 1 through stage 4 chronic kidney disease, or unspecified chronic kidney disease: Secondary | ICD-10-CM | POA: Diagnosis not present

## 2024-06-27 DIAGNOSIS — I5189 Other ill-defined heart diseases: Secondary | ICD-10-CM | POA: Diagnosis not present

## 2024-06-27 DIAGNOSIS — F331 Major depressive disorder, recurrent, moderate: Secondary | ICD-10-CM | POA: Diagnosis not present

## 2024-06-27 DIAGNOSIS — N1832 Chronic kidney disease, stage 3b: Secondary | ICD-10-CM | POA: Diagnosis not present

## 2024-06-30 DIAGNOSIS — I5189 Other ill-defined heart diseases: Secondary | ICD-10-CM | POA: Diagnosis not present

## 2024-06-30 DIAGNOSIS — F331 Major depressive disorder, recurrent, moderate: Secondary | ICD-10-CM | POA: Diagnosis not present

## 2024-06-30 DIAGNOSIS — I5032 Chronic diastolic (congestive) heart failure: Secondary | ICD-10-CM | POA: Diagnosis not present

## 2024-06-30 DIAGNOSIS — N1832 Chronic kidney disease, stage 3b: Secondary | ICD-10-CM | POA: Diagnosis not present

## 2024-06-30 DIAGNOSIS — I87323 Chronic venous hypertension (idiopathic) with inflammation of bilateral lower extremity: Secondary | ICD-10-CM | POA: Diagnosis not present

## 2024-06-30 DIAGNOSIS — I130 Hypertensive heart and chronic kidney disease with heart failure and stage 1 through stage 4 chronic kidney disease, or unspecified chronic kidney disease: Secondary | ICD-10-CM | POA: Diagnosis not present

## 2024-07-03 DIAGNOSIS — I130 Hypertensive heart and chronic kidney disease with heart failure and stage 1 through stage 4 chronic kidney disease, or unspecified chronic kidney disease: Secondary | ICD-10-CM | POA: Diagnosis not present

## 2024-07-03 DIAGNOSIS — N1832 Chronic kidney disease, stage 3b: Secondary | ICD-10-CM | POA: Diagnosis not present

## 2024-07-03 DIAGNOSIS — I5189 Other ill-defined heart diseases: Secondary | ICD-10-CM | POA: Diagnosis not present

## 2024-07-03 DIAGNOSIS — I5032 Chronic diastolic (congestive) heart failure: Secondary | ICD-10-CM | POA: Diagnosis not present

## 2024-07-03 DIAGNOSIS — F331 Major depressive disorder, recurrent, moderate: Secondary | ICD-10-CM | POA: Diagnosis not present

## 2024-07-03 DIAGNOSIS — I87323 Chronic venous hypertension (idiopathic) with inflammation of bilateral lower extremity: Secondary | ICD-10-CM | POA: Diagnosis not present

## 2024-07-06 DIAGNOSIS — I5032 Chronic diastolic (congestive) heart failure: Secondary | ICD-10-CM | POA: Diagnosis not present

## 2024-07-06 DIAGNOSIS — N1832 Chronic kidney disease, stage 3b: Secondary | ICD-10-CM | POA: Diagnosis not present

## 2024-07-06 DIAGNOSIS — I130 Hypertensive heart and chronic kidney disease with heart failure and stage 1 through stage 4 chronic kidney disease, or unspecified chronic kidney disease: Secondary | ICD-10-CM | POA: Diagnosis not present

## 2024-07-06 DIAGNOSIS — I87323 Chronic venous hypertension (idiopathic) with inflammation of bilateral lower extremity: Secondary | ICD-10-CM | POA: Diagnosis not present

## 2024-07-06 DIAGNOSIS — I5189 Other ill-defined heart diseases: Secondary | ICD-10-CM | POA: Diagnosis not present

## 2024-07-06 DIAGNOSIS — F331 Major depressive disorder, recurrent, moderate: Secondary | ICD-10-CM | POA: Diagnosis not present

## 2024-07-11 DIAGNOSIS — I130 Hypertensive heart and chronic kidney disease with heart failure and stage 1 through stage 4 chronic kidney disease, or unspecified chronic kidney disease: Secondary | ICD-10-CM | POA: Diagnosis not present

## 2024-07-11 DIAGNOSIS — N1832 Chronic kidney disease, stage 3b: Secondary | ICD-10-CM | POA: Diagnosis not present

## 2024-07-11 DIAGNOSIS — I5189 Other ill-defined heart diseases: Secondary | ICD-10-CM | POA: Diagnosis not present

## 2024-07-11 DIAGNOSIS — F331 Major depressive disorder, recurrent, moderate: Secondary | ICD-10-CM | POA: Diagnosis not present

## 2024-07-11 DIAGNOSIS — I5032 Chronic diastolic (congestive) heart failure: Secondary | ICD-10-CM | POA: Diagnosis not present

## 2024-07-11 DIAGNOSIS — I87323 Chronic venous hypertension (idiopathic) with inflammation of bilateral lower extremity: Secondary | ICD-10-CM | POA: Diagnosis not present

## 2024-07-14 DIAGNOSIS — I5032 Chronic diastolic (congestive) heart failure: Secondary | ICD-10-CM | POA: Diagnosis not present

## 2024-07-14 DIAGNOSIS — I130 Hypertensive heart and chronic kidney disease with heart failure and stage 1 through stage 4 chronic kidney disease, or unspecified chronic kidney disease: Secondary | ICD-10-CM | POA: Diagnosis not present

## 2024-07-14 DIAGNOSIS — I87323 Chronic venous hypertension (idiopathic) with inflammation of bilateral lower extremity: Secondary | ICD-10-CM | POA: Diagnosis not present

## 2024-07-14 DIAGNOSIS — F331 Major depressive disorder, recurrent, moderate: Secondary | ICD-10-CM | POA: Diagnosis not present

## 2024-07-14 DIAGNOSIS — N1832 Chronic kidney disease, stage 3b: Secondary | ICD-10-CM | POA: Diagnosis not present

## 2024-07-14 DIAGNOSIS — I5189 Other ill-defined heart diseases: Secondary | ICD-10-CM | POA: Diagnosis not present

## 2024-07-17 DIAGNOSIS — F331 Major depressive disorder, recurrent, moderate: Secondary | ICD-10-CM | POA: Diagnosis not present

## 2024-07-17 DIAGNOSIS — I5032 Chronic diastolic (congestive) heart failure: Secondary | ICD-10-CM | POA: Diagnosis not present

## 2024-07-17 DIAGNOSIS — N1832 Chronic kidney disease, stage 3b: Secondary | ICD-10-CM | POA: Diagnosis not present

## 2024-07-17 DIAGNOSIS — I87323 Chronic venous hypertension (idiopathic) with inflammation of bilateral lower extremity: Secondary | ICD-10-CM | POA: Diagnosis not present

## 2024-07-17 DIAGNOSIS — I130 Hypertensive heart and chronic kidney disease with heart failure and stage 1 through stage 4 chronic kidney disease, or unspecified chronic kidney disease: Secondary | ICD-10-CM | POA: Diagnosis not present

## 2024-07-17 DIAGNOSIS — I5189 Other ill-defined heart diseases: Secondary | ICD-10-CM | POA: Diagnosis not present

## 2024-07-20 DIAGNOSIS — F331 Major depressive disorder, recurrent, moderate: Secondary | ICD-10-CM | POA: Diagnosis not present

## 2024-07-20 DIAGNOSIS — I130 Hypertensive heart and chronic kidney disease with heart failure and stage 1 through stage 4 chronic kidney disease, or unspecified chronic kidney disease: Secondary | ICD-10-CM | POA: Diagnosis not present

## 2024-07-20 DIAGNOSIS — I5032 Chronic diastolic (congestive) heart failure: Secondary | ICD-10-CM | POA: Diagnosis not present

## 2024-07-20 DIAGNOSIS — I87323 Chronic venous hypertension (idiopathic) with inflammation of bilateral lower extremity: Secondary | ICD-10-CM | POA: Diagnosis not present

## 2024-07-20 DIAGNOSIS — I5189 Other ill-defined heart diseases: Secondary | ICD-10-CM | POA: Diagnosis not present

## 2024-07-20 DIAGNOSIS — N1832 Chronic kidney disease, stage 3b: Secondary | ICD-10-CM | POA: Diagnosis not present

## 2024-07-21 DIAGNOSIS — F331 Major depressive disorder, recurrent, moderate: Secondary | ICD-10-CM | POA: Diagnosis not present

## 2024-07-21 DIAGNOSIS — I87329 Chronic venous hypertension (idiopathic) with inflammation of unspecified lower extremity: Secondary | ICD-10-CM | POA: Diagnosis not present

## 2024-07-21 DIAGNOSIS — L989 Disorder of the skin and subcutaneous tissue, unspecified: Secondary | ICD-10-CM | POA: Diagnosis not present

## 2024-07-21 DIAGNOSIS — M109 Gout, unspecified: Secondary | ICD-10-CM | POA: Diagnosis not present

## 2024-07-21 DIAGNOSIS — M1990 Unspecified osteoarthritis, unspecified site: Secondary | ICD-10-CM | POA: Diagnosis not present

## 2024-07-21 DIAGNOSIS — E785 Hyperlipidemia, unspecified: Secondary | ICD-10-CM | POA: Diagnosis not present

## 2024-07-21 DIAGNOSIS — E1169 Type 2 diabetes mellitus with other specified complication: Secondary | ICD-10-CM | POA: Diagnosis not present

## 2024-07-21 DIAGNOSIS — F419 Anxiety disorder, unspecified: Secondary | ICD-10-CM | POA: Diagnosis not present

## 2024-07-21 DIAGNOSIS — I130 Hypertensive heart and chronic kidney disease with heart failure and stage 1 through stage 4 chronic kidney disease, or unspecified chronic kidney disease: Secondary | ICD-10-CM | POA: Diagnosis not present

## 2024-07-21 DIAGNOSIS — I83028 Varicose veins of left lower extremity with ulcer other part of lower leg: Secondary | ICD-10-CM | POA: Diagnosis not present

## 2024-07-21 DIAGNOSIS — N1832 Chronic kidney disease, stage 3b: Secondary | ICD-10-CM | POA: Diagnosis not present

## 2024-07-21 DIAGNOSIS — I509 Heart failure, unspecified: Secondary | ICD-10-CM | POA: Diagnosis not present

## 2024-08-16 DIAGNOSIS — H353221 Exudative age-related macular degeneration, left eye, with active choroidal neovascularization: Secondary | ICD-10-CM | POA: Diagnosis not present

## 2024-09-24 ENCOUNTER — Emergency Department (HOSPITAL_COMMUNITY)
Admission: EM | Admit: 2024-09-24 | Discharge: 2024-09-24 | Disposition: A | Discharge status: Home / Self Care | Attending: Emergency Medicine | Admitting: Emergency Medicine

## 2024-09-24 ENCOUNTER — Emergency Department (HOSPITAL_COMMUNITY)

## 2024-09-24 ENCOUNTER — Other Ambulatory Visit: Payer: Self-pay

## 2024-09-24 DIAGNOSIS — R1031 Right lower quadrant pain: Secondary | ICD-10-CM | POA: Insufficient documentation

## 2024-09-24 DIAGNOSIS — R10A1 Flank pain, right side: Secondary | ICD-10-CM | POA: Diagnosis present

## 2024-09-24 LAB — COMPREHENSIVE METABOLIC PANEL WITH GFR
ALT: 11 U/L (ref 0–44)
AST: 22 U/L (ref 15–41)
Albumin: 3.6 g/dL (ref 3.5–5.0)
Alkaline Phosphatase: 148 U/L — ABNORMAL HIGH (ref 38–126)
Anion gap: 12 (ref 5–15)
BUN: 19 mg/dL (ref 8–23)
CO2: 25 mmol/L (ref 22–32)
Calcium: 9 mg/dL (ref 8.9–10.3)
Chloride: 102 mmol/L (ref 98–111)
Creatinine, Ser: 1.14 mg/dL — ABNORMAL HIGH (ref 0.44–1.00)
GFR, Estimated: 44 mL/min — ABNORMAL LOW
Glucose, Bld: 100 mg/dL — ABNORMAL HIGH (ref 70–99)
Potassium: 4.9 mmol/L (ref 3.5–5.1)
Sodium: 139 mmol/L (ref 135–145)
Total Bilirubin: 1.1 mg/dL (ref 0.0–1.2)
Total Protein: 6.5 g/dL (ref 6.5–8.1)

## 2024-09-24 LAB — URINALYSIS, ROUTINE W REFLEX MICROSCOPIC
Bacteria, UA: NONE SEEN
Bilirubin Urine: NEGATIVE
Glucose, UA: NEGATIVE mg/dL
Ketones, ur: NEGATIVE mg/dL
Nitrite: NEGATIVE
Protein, ur: 100 mg/dL — AB
Specific Gravity, Urine: 1.014 (ref 1.005–1.030)
pH: 7 (ref 5.0–8.0)

## 2024-09-24 LAB — CBC WITH DIFFERENTIAL/PLATELET
Abs Immature Granulocytes: 0.02 K/uL (ref 0.00–0.07)
Basophils Absolute: 0 K/uL (ref 0.0–0.1)
Basophils Relative: 1 %
Eosinophils Absolute: 0.3 K/uL (ref 0.0–0.5)
Eosinophils Relative: 4 %
HCT: 48.8 % — ABNORMAL HIGH (ref 36.0–46.0)
Hemoglobin: 15.4 g/dL — ABNORMAL HIGH (ref 12.0–15.0)
Immature Granulocytes: 0 %
Lymphocytes Relative: 8 %
Lymphs Abs: 0.6 K/uL — ABNORMAL LOW (ref 0.7–4.0)
MCH: 31.1 pg (ref 26.0–34.0)
MCHC: 31.6 g/dL (ref 30.0–36.0)
MCV: 98.6 fL (ref 80.0–100.0)
Monocytes Absolute: 0.6 K/uL (ref 0.1–1.0)
Monocytes Relative: 8 %
Neutro Abs: 5.8 K/uL (ref 1.7–7.7)
Neutrophils Relative %: 79 %
Platelets: 155 K/uL (ref 150–400)
RBC: 4.95 MIL/uL (ref 3.87–5.11)
RDW: 14.6 % (ref 11.5–15.5)
WBC: 7.3 K/uL (ref 4.0–10.5)
nRBC: 0 % (ref 0.0–0.2)

## 2024-09-24 LAB — LIPASE, BLOOD: Lipase: 23 U/L (ref 11–51)

## 2024-09-24 MED ORDER — METHOCARBAMOL 500 MG PO TABS: 500.0000 mg | ORAL_TABLET | Freq: Two times a day (BID) | ORAL | 0 refills | Status: AC | PRN

## 2024-09-24 MED ORDER — CHLORTHALIDONE 25 MG PO TABS
25.0000 mg | ORAL_TABLET | Freq: Every day | ORAL | Status: DC
  Filled 2024-09-24: qty 1

## 2024-09-24 MED ORDER — NEBIVOLOL HCL 10 MG PO TABS
10.0000 mg | ORAL_TABLET | Freq: Every day | ORAL | Status: DC
  Administered 2024-09-24: 10 mg via ORAL
  Filled 2024-09-24 (×2): qty 1

## 2024-09-24 MED ORDER — ONDANSETRON HCL 4 MG/2ML IJ SOLN
4.0000 mg | Freq: Once | INTRAMUSCULAR | Status: AC
  Administered 2024-09-24: 4 mg via INTRAVENOUS
  Filled 2024-09-24: qty 2

## 2024-09-24 MED ORDER — IOHEXOL 300 MG/ML  SOLN
80.0000 mL | Freq: Once | INTRAMUSCULAR | Status: AC | PRN
  Administered 2024-09-24: 80 mL via INTRAVENOUS

## 2024-09-24 MED ORDER — AMLODIPINE BESYLATE 5 MG PO TABS
5.0000 mg | ORAL_TABLET | Freq: Once | ORAL | Status: DC
  Filled 2024-09-24: qty 1

## 2024-09-24 NOTE — ED Notes (Signed)
 Provider notified nebivolol  to come from pharmacy, hence the delay in administration.

## 2024-09-24 NOTE — Progress Notes (Signed)
 Attempted to do CT scan.  Pt unable to lay on stretcher at same angle that is required on the CT table. CT scan not obtained

## 2024-09-24 NOTE — ED Notes (Addendum)
 Provider notified of pt's SBP >180. Pt and family uncertain if bp meds were taken today.

## 2024-09-24 NOTE — Discharge Instructions (Signed)
 Please follow-up closely with your primary care doctor on an outpatient basis.  Return to emergency department immediately for any new or worsening symptoms.

## 2024-09-24 NOTE — ED Notes (Signed)
 [facility] notified to retrieve Nebivolol  tablet from main pharmacy.

## 2024-09-24 NOTE — ED Provider Notes (Signed)
 " [facility] EMERGENCY DEPARTMENT AT [facility] Provider Note   CSN: [id number] Arrival date & time: 09/24/24  1554     Patient presents with: Flank Pain   Brooke Lane is a 89 y.o. female.   Patient is a 89 year old female who presents to the emergency department with a chief complaint of right side abdominal pain which has been ongoing since last night.  Patient is currently on Macrobid for urinary tract infection which she was diagnosed with on Monday.  Patient notes that she has had no associated fever or chills.  She does admit to some nausea without vomiting.  She has had no diarrhea and her last bowel movement was last night.  She denies any urinary complaints at this time.  She has had no recent falls or blunt abdominal wall trauma.  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 Authorizing Provider  allopurinol (ZYLOPRIM) 300 MG tablet Take 1 tablet by mouth daily. 12/03/20   [provider]  amLODipine  (NORVASC ) 5 MG tablet Take 1 tablet (5 mg total) by mouth daily. 12/10/16   Cherlyn Labella, MD  chlorthalidone  (HYGROTON ) 25 MG tablet     [provider]  clopidogrel  (PLAVIX ) 75 MG tablet Take 75 mg by mouth daily.    [provider]  colchicine 0.6 MG tablet Take 0.3 mg by mouth daily as needed. 02/26/22   [provider]  dextromethorphan -guaiFENesin  (MUCINEX  DM) 30-600 MG 12hr tablet Take 1 tablet by mouth 2 (two) times daily as needed for cough.    [provider]  diphenhydrAMINE (BENADRYL) 25 mg capsule Take 25 mg by mouth every 6 (six) hours as needed for allergies.    [provider]  DUPIXENT  300 MG/2ML prefilled syringe INJECT 300MG  (1 SYRINGE) SUBCUTANEOUSLY EVERY 2 WEEKS  AS DIRECTED. 11/06/21   Livingston Rigg, MD  furosemide  (LASIX ) 40 MG tablet Take 1 tablet (40 mg total) by mouth daily. 12/10/16   Akula, Vijaya, MD  gemfibrozil  (LOPID ) 600 MG tablet Take 600 mg by mouth 2 (two) times daily before a meal.    [provider]  linaclotide (LINZESS) 72 MCG capsule Take 1 tablet by mouth daily.    [provider]  nebivolol  (BYSTOLIC ) 10 MG tablet Take 1 tablet by mouth daily. 01/02/21   [provider]  sodium chloride  (OCEAN) 0.65 % SOLN nasal spray Place 1 spray into both nostrils as needed for congestion.    [provider]    Allergies: Patient has no known allergies.    Review of Systems  Genitourinary:  Positive for flank pain.  All other systems reviewed and are negative.   Updated Vital Signs BP (!) 187/88 (BP Location: Left Arm)   Pulse 65   Temp 98.3 F (36.8 C) (Oral)   Resp 16   Ht 5' 6 (1.676 m)   Wt 105.2 kg   SpO2 91%   BMI 37.45 kg/m   Physical Exam Vitals and nursing note reviewed.  Constitutional:      General: She is not in acute distress.    Appearance: Normal appearance. She is not ill-appearing.  HENT:     Head: Normocephalic and atraumatic.     Nose: Nose normal.     Mouth/Throat:     Mouth: Mucous  membranes are moist.  Eyes:     Extraocular Movements: Extraocular movements intact.     Conjunctiva/sclera: Conjunctivae normal.     Pupils: Pupils are equal, round, and reactive to light.  Cardiovascular:     Rate and Rhythm: Normal rate and regular rhythm.     Pulses: Normal pulses.     Heart sounds: Normal heart sounds. No murmur heard.    No gallop.  Pulmonary:     Effort: Pulmonary effort is normal. No respiratory distress.     Breath sounds: Normal breath sounds. No stridor. No wheezing, rhonchi or rales.  Abdominal:     General: Abdomen is flat. Bowel sounds are normal. There is no distension.     Palpations: Abdomen is soft.     Tenderness: There is no guarding.      Comments: Tender to palpation of the right lower quadrant  Musculoskeletal:        General: Normal range of motion.     Cervical back: Normal range of motion and neck supple. No rigidity or tenderness.  Skin:    General: Skin is warm and dry.  Neurological:     General: No focal deficit present.     Mental Status: She is alert and oriented to person, place, and time. Mental status is at baseline.  Psychiatric:        Mood and Affect: Mood normal.        Behavior: Behavior normal.        Thought Content: Thought content normal.        Judgment: Judgment normal.     (all labs ordered are listed, but only abnormal results are displayed) Labs Reviewed  URINE CULTURE  URINALYSIS, ROUTINE W REFLEX MICROSCOPIC  COMPREHENSIVE METABOLIC PANEL WITH GFR  CBC WITH DIFFERENTIAL/PLATELET  LIPASE, BLOOD    EKG: None  Radiology: No results found.   Procedures   Medications Ordered in the ED - No data to display                                  Medical Decision Making Amount and/or Complexity of Data Reviewed Labs: ordered. Radiology: ordered.  Risk Prescription drug management.   This patient presents to the ED for concern of right sided abdominal pain differential diagnosis includes acute appendicitis, cholecystitis, bowel obstruction, diverticulitis, ovarian torsion or cyst, pyelonephritis, kidney stone, pancreatitis, mesenteric ischemia    Additional history obtained:  Additional history obtained from family External records from outside source obtained and reviewed including medical records   Lab Tests:  I Ordered, and personally interpreted labs.  The pertinent results include: No leukocytosis, no anemia, mild elevation in creatinine but improved from baseline, normal liver function, unremarkable electrolytes, unremarkable urinalysis   Imaging Studies ordered:  I ordered imaging studies including CT scan abdomen and pelvis I independently visualized and  interpreted imaging which showed no acute surgical process, diverticulosis without diverticulitis, stable ovarian mass I agree with the radiologist interpretation   Medicines ordered and prescription drug management:  I ordered medication including Zofran , Bystolic  for hypertension and nausea Reevaluation of the patient after these medicines showed that the patient improved I have reviewed the patients home medicines and have made adjustments as needed   Problem List / ED Course:  Patient is doing well at this time and is stable for discharge home.  Discussed with patient and family that CT scan of the abdomen and pelvis has been  unremarkable.  Patient is already on antibiotics for urinary tract infection and urinalysis is unremarkable at this time.  Blood work demonstrates no acute findings as well.  Do not suspect any further emergent workup is warranted at this time.  Patient has no associated rash to her skin and no indication for shingles.  Close follow-up with PCP was discussed as well as strict turn precautions for any new or worsening symptoms.  Patient and family voiced understanding and had no additional questions.  Patient is already aware of the ovarian mass and family and patient have elected for no treatment or further workup.   Social Determinants of Health:  None        Final diagnoses:  None    ED Discharge Orders     None          Daralene Lonni JONETTA DEVONNA 09/24/24 1853    Cleotilde Rogue, MD 09/24/24 2219  "

## 2024-09-24 NOTE — ED Triage Notes (Signed)
 Pt c/o right flank pain started today. Pt last BM was last night. No c/o vomiting but has had some nausea. Pt is Macrobid since Monday for a UTI. Pt is on 2 lpm baseline and uses a walker at home.

## 2024-09-25 LAB — URINE CULTURE: Culture: NO GROWTH

## 2024-10-10 ENCOUNTER — Ambulatory Visit: Admitting: Dermatology

## 2024-10-31 ENCOUNTER — Ambulatory Visit: Admitting: Physician Assistant
